# Patient Record
Sex: Female | Born: 1960
Health system: Southern US, Community
[De-identification: ages and names within clinical notes are randomized; demographics above are authoritative.]

## PROBLEM LIST (undated history)

## (undated) DIAGNOSIS — N951 Menopausal and female climacteric states: Secondary | ICD-10-CM

## (undated) DIAGNOSIS — IMO0002 Reserved for concepts with insufficient information to code with codable children: Secondary | ICD-10-CM

## (undated) DIAGNOSIS — K219 Gastro-esophageal reflux disease without esophagitis: Secondary | ICD-10-CM

## (undated) DIAGNOSIS — J302 Other seasonal allergic rhinitis: Secondary | ICD-10-CM

## (undated) DIAGNOSIS — I493 Ventricular premature depolarization: Secondary | ICD-10-CM

## (undated) DIAGNOSIS — F32A Depression, unspecified: Secondary | ICD-10-CM

## (undated) DIAGNOSIS — I491 Atrial premature depolarization: Secondary | ICD-10-CM

## (undated) DIAGNOSIS — B399 Histoplasmosis, unspecified: Secondary | ICD-10-CM

## (undated) DIAGNOSIS — G4700 Insomnia, unspecified: Secondary | ICD-10-CM

## (undated) DIAGNOSIS — R011 Cardiac murmur, unspecified: Secondary | ICD-10-CM

## (undated) DIAGNOSIS — F411 Generalized anxiety disorder: Secondary | ICD-10-CM

## (undated) DIAGNOSIS — J069 Acute upper respiratory infection, unspecified: Secondary | ICD-10-CM

## (undated) DIAGNOSIS — K5909 Other constipation: Secondary | ICD-10-CM

## (undated) HISTORY — DX: Insomnia, unspecified: G47.00

## (undated) HISTORY — DX: Gastro-esophageal reflux disease without esophagitis: K21.9

## (undated) HISTORY — PX: TUBAL LIGATION: SHX77

## (undated) HISTORY — PX: SINOSCOPY: SHX187

## (undated) HISTORY — DX: Cardiac murmur, unspecified: R01.1

## (undated) HISTORY — DX: Acute upper respiratory infection, unspecified: J06.9

## (undated) HISTORY — PX: NOSE SURGERY: SHX723

## (undated) HISTORY — DX: Depression, unspecified: F32.A

## (undated) HISTORY — DX: Atrial premature depolarization: I49.1

## (undated) HISTORY — DX: Other constipation: K59.09

## (undated) HISTORY — DX: Ventricular premature depolarization: I49.3

## (undated) HISTORY — DX: Histoplasmosis, unspecified: B39.9

## (undated) HISTORY — PX: DILATION AND CURETTAGE OF UTERUS: SHX78

## (undated) HISTORY — DX: Reserved for concepts with insufficient information to code with codable children: IMO0002

## (undated) HISTORY — DX: Other seasonal allergic rhinitis: J30.2

## (undated) HISTORY — DX: Generalized anxiety disorder: F41.1

## (undated) HISTORY — DX: Menopausal and female climacteric states: N95.1

---

## 1993-04-28 DIAGNOSIS — K5909 Other constipation: Secondary | ICD-10-CM | POA: Insufficient documentation

## 2000-12-16 DIAGNOSIS — R55 Syncope and collapse: Secondary | ICD-10-CM | POA: Insufficient documentation

## 2002-09-10 DIAGNOSIS — Z8709 Personal history of other diseases of the respiratory system: Secondary | ICD-10-CM | POA: Insufficient documentation

## 2002-09-10 DIAGNOSIS — Z87898 Personal history of other specified conditions: Secondary | ICD-10-CM | POA: Insufficient documentation

## 2004-07-25 DIAGNOSIS — B399 Histoplasmosis, unspecified: Secondary | ICD-10-CM

## 2004-07-25 HISTORY — DX: Histoplasmosis, unspecified: B39.9

## 2005-12-09 DIAGNOSIS — R918 Other nonspecific abnormal finding of lung field: Secondary | ICD-10-CM | POA: Insufficient documentation

## 2008-07-25 HISTORY — PX: COLPOSCOPY: SHX161

## 2013-02-20 ENCOUNTER — Ambulatory Visit (INDEPENDENT_AMBULATORY_CARE_PROVIDER_SITE_OTHER): Payer: BC Managed Care – PPO | Admitting: Family Medicine

## 2013-02-20 ENCOUNTER — Encounter: Payer: Self-pay | Admitting: Family Medicine

## 2013-02-20 VITALS — BP 122/84 | HR 85 | Temp 98.1°F | Ht 64.0 in | Wt 145.2 lb

## 2013-02-20 DIAGNOSIS — B399 Histoplasmosis, unspecified: Secondary | ICD-10-CM

## 2013-02-20 DIAGNOSIS — J302 Other seasonal allergic rhinitis: Secondary | ICD-10-CM

## 2013-02-20 DIAGNOSIS — M6790 Unspecified disorder of synovium and tendon, unspecified site: Secondary | ICD-10-CM

## 2013-02-20 DIAGNOSIS — F411 Generalized anxiety disorder: Secondary | ICD-10-CM

## 2013-02-20 DIAGNOSIS — K219 Gastro-esophageal reflux disease without esophagitis: Secondary | ICD-10-CM

## 2013-02-20 DIAGNOSIS — M6789 Other specified disorders of synovium and tendon, multiple sites: Secondary | ICD-10-CM

## 2013-02-20 DIAGNOSIS — J309 Allergic rhinitis, unspecified: Secondary | ICD-10-CM

## 2013-02-20 NOTE — Progress Notes (Signed)
Nature conservation officer at [facility] [address] [place] [address] Phone: [phone] Fax: [phone]  Date:  02/20/2013   Name:  Katie Todd   DOB:  26-May-1961   MRN:  [id number] Gender: female Age: 52 y.o.  Primary Physician:  Hannah Beat, MD  Evaluating MD: Hannah Beat, MD   Chief Complaint: Establish Care   History of Present Illness:  Katie Todd is a 52 y.o. pleasant patient who presents with the following:  New patient evaluation: Found lung nodules - coughed a tone up. Had a large work-up in [place], including the [facility] and found to have Histoplasmosis.  Has had some pulmonary nodules in the past, then did a CT scan. ? Histoplasmosis. Never had a bronch.   R 2nd digit nodule, appears to be contiguous with space between PIP and MCP on the R.  She also has intermittent allergies  Some anxiety and uses prn ativan  GERD also with zantac use  There are no active problems to display for this patient.   No past medical history on file.  No past surgical history on file.  History   Social History  . Marital Status: Married    Spouse Name: N/A    Number of Children: N/A  . Years of Education: N/A   Occupational History  . Not on file.   Social History Main Topics  . Smoking status: Former Smoker    Types: Cigarettes    Quit date: 02/20/1990  . Smokeless tobacco: Not on file  . Alcohol Use: Not on file  . Drug Use: Not on file  . Sexually Active: Not on file   Other Topics Concern  . Not on file   Social History Narrative  . No narrative on file    No family history on file.  Allergies  Allergen Reactions  . Penicillins Anaphylaxis    Medication list has been reviewed and updated.  No outpatient prescriptions prior to visit.   No facility-administered medications prior to visit.    Review of Systems:   GEN: No acute illnesses, no fevers, chills. GI: No n/v/d, eating normally Pulm: No SOB Interactive and  getting along well at home.  Otherwise, ROS is as per the HPI.   Physical Examination: BP 122/84  Pulse 85  Temp(Src) 98.1 F (36.7 C) (Oral)  Ht 5\' 4"  (1.626 m)  Wt 145 lb 4 oz (65.885 kg)  BMI 24.92 kg/m2  SpO2 98%  LMP 07/25/2012  Ideal Body Weight: Weight in (lb) to have BMI = 25: 145.3   GEN: WDWN, NAD, Non-toxic, A & O x 3 HEENT: Atraumatic, Normocephalic. Neck supple. No masses, No LAD. Ears and Nose: No external deformity. CV: RRR, No M/G/R. No JVD. No thrill. No extra heart sounds. PULM: CTA B, no wheezes, crackles, rhonchi. No retractions. No resp. distress. No accessory muscle use. EXTR: No c/c/e NEURO Normal gait.  PSYCH: Normally interactive. Conversant. Not depressed or anxious appearing.  Calm demeanor.    R hand Ecchymosis or edema: neg ROM wrist/hand/digits: full  Carpals, MCP's, digits: palpable nodule on the flexor aspect of the 2nd digit between the mcp and pip joints Distal Ulna and Radius: NT Ecchymosis or edema: neg No instability Digit triggering: neg Finkelstein's test: neg Snuffbox tenderness: neg Scaphoid tubercle: NT Resisted supination: NT Full composite fist, no malrotation Grip, all digits: 5/5 str DIPJT: NT PIP JT: NT MCP JT: NT No tenosynovitis Axial load test: neg Phalen's: neg Tinel's: neg Atrophy: neg  Hand sensation: intact   Assessment and Plan:  Histoplasmosis  Seasonal allergies  GERD (gastroesophageal reflux disease)  Anxiety state, unspecified  Nodule of flexor tendon sheath   Reviewed PMH We will obtain records from the patient's prior physicians and review.  All stable except nodule, which I think is contiguous with sheath. No triggering, but I think injecting the sheath with steroids could decrease its size, like with a trigger finger injection.  Flexor Tendon sheath Injection, R 2nd Verbal consent was obtained. Risks (including rare risk of infection, potential risk for skin lightening and potential  atrophy), benefits and alternatives were discussed. Prepped with Chloraprep and Ethyl Chloride used for anesthesia. Under sterile conditions, patient injected at palmar crease aiming distally with 45 degree angle towards nodule; injected directly into tendon sheath. Medication flowed freely without resistance.  Needle size: 22 gauge 1 1/2 inch Injection: 1/2 cc of Lidocaine 1% and 1/2 cc of Depo-Medrol 40 mg   Orders Today:  No orders of the defined types were placed in this encounter.    Updated Medication List: (Includes new medications, updates to list, dose adjustments) Meds ordered this encounter  Medications  . LORazepam (ATIVAN) 1 MG tablet    Sig: Take 1 mg by mouth at bedtime as needed for anxiety.  . Nutritional Supplements (ESTROVEN PO)    Sig: Take 1 tablet by mouth every other day.  . ranitidine (ZANTAC) 150 MG capsule    Sig: Take 150 mg by mouth 2 (two) times daily.  Marland Kitchen loratadine (CLARITIN) 10 MG tablet    Sig: Take 10 mg by mouth daily.    Medications Discontinued: There are no discontinued medications.    Signed, Elpidio Galea. Elisheva Fallas, MD 02/20/2013 10:48 AM

## 2013-02-23 ENCOUNTER — Encounter: Payer: Self-pay | Admitting: Family Medicine

## 2013-02-23 DIAGNOSIS — J302 Other seasonal allergic rhinitis: Secondary | ICD-10-CM | POA: Insufficient documentation

## 2013-02-23 DIAGNOSIS — B399 Histoplasmosis, unspecified: Secondary | ICD-10-CM | POA: Insufficient documentation

## 2013-02-23 DIAGNOSIS — F411 Generalized anxiety disorder: Secondary | ICD-10-CM | POA: Insufficient documentation

## 2013-02-23 DIAGNOSIS — K219 Gastro-esophageal reflux disease without esophagitis: Secondary | ICD-10-CM | POA: Insufficient documentation

## 2013-02-23 HISTORY — DX: Other seasonal allergic rhinitis: J30.2

## 2013-02-23 HISTORY — DX: Generalized anxiety disorder: F41.1

## 2013-02-23 HISTORY — DX: Gastro-esophageal reflux disease without esophagitis: K21.9

## 2013-02-26 DIAGNOSIS — M6790 Unspecified disorder of synovium and tendon, unspecified site: Secondary | ICD-10-CM | POA: Insufficient documentation

## 2014-02-10 ENCOUNTER — Encounter: Payer: Self-pay | Admitting: Obstetrics & Gynecology

## 2014-02-10 ENCOUNTER — Ambulatory Visit (INDEPENDENT_AMBULATORY_CARE_PROVIDER_SITE_OTHER): Payer: BC Managed Care – PPO | Admitting: Obstetrics & Gynecology

## 2014-02-10 VITALS — BP 131/83 | HR 80 | Ht 64.5 in | Wt 153.6 lb

## 2014-02-10 DIAGNOSIS — Z124 Encounter for screening for malignant neoplasm of cervix: Secondary | ICD-10-CM

## 2014-02-10 DIAGNOSIS — Z1151 Encounter for screening for human papillomavirus (HPV): Secondary | ICD-10-CM

## 2014-02-10 DIAGNOSIS — Z0000 Encounter for general adult medical examination without abnormal findings: Secondary | ICD-10-CM

## 2014-02-10 DIAGNOSIS — Z113 Encounter for screening for infections with a predominantly sexual mode of transmission: Secondary | ICD-10-CM

## 2014-02-10 DIAGNOSIS — Z01419 Encounter for gynecological examination (general) (routine) without abnormal findings: Secondary | ICD-10-CM

## 2014-02-10 MED ORDER — ESTRADIOL-NORETHINDRONE ACET 1-0.5 MG PO TABS: 1.0000 | ORAL_TABLET | Freq: Every day | ORAL | Status: DC

## 2014-02-12 LAB — CYTOLOGY - PAP

## 2014-02-13 NOTE — Progress Notes (Signed)
  Subjective:     Katie Todd is a 53 y.o. female here for a routine exam.  Current complaints: sever hot flashes and night sweats which do not allow her to sleep through the night.  She is lethargic at work and is interested in starting HRT.  She has her uterus and will need prog as well.  Discussed risks of HRT.  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  Personal health questionnaire reviewed: yes.   Gynecologic History Patient's last menstrual period was 01/24/2014. Contraception: sterilization Last Pap: nml per patient (non in our system) Last mammogram: nml per patient.  None in our system  Obstetric History OB History  Gravida Para Term Preterm AB SAB TAB Ectopic Multiple Living  5 1 1  0 4     1    # Outcome Date GA Lbr Len/2nd Weight Sex Delivery Anes PTL Lv  5 ABT           4 ABT           3 ABT           2 ABT           1 TRM                The following portions of the patient's history were reviewed and updated as appropriate: allergies, current medications, past family history, past medical history, past social history, past surgical history and problem list.  Review of Systems Pertinent items are noted in HPI.    Objective:      Filed Vitals:   02/10/14 1437  BP: 131/83  Pulse: 80  Height: 5' 4.5" (1.638 m)  Weight: 153 lb 9.6 oz (69.673 kg)   Vitals:  WNL General appearance: alert, cooperative and no distress Head: Normocephalic, without obvious abnormality, atraumatic Eyes: negative Throat: lips, mucosa, and tongue normal; teeth and gums normal Lungs: clear to auscultation bilaterally Breasts: normal appearance, no masses or tenderness, No nipple retraction or dimpling, No nipple discharge or bleeding Heart: regular rate and rhythm Abdomen: soft, non-tender; bowel sounds normal; no masses,  no organomegaly  Pelvic:  External Genitalia:  Tanner V, no lesion Urethra:  No prolapse Vagina:  Pink, normal rugae, no blood or  discharge Cervix:  No CMT, no lesion Uterus:  Normal size and contour, non tender Adnexa:  Normal, no masses, non tender Rectovaginal Septum:  Non tender, no masses  Extremities: no edema, redness or tenderness in the calves or thighs Skin: no lesions or rash Lymph nodes: Axillary adenopathy: none         Assessment:    Healthy female exam.    Plan:    Mammogram ordered. Follow up in: 3 months. Pap with co testing Activella ordered.  Reviewed side effects.

## 2014-03-06 ENCOUNTER — Telehealth: Payer: Self-pay | Admitting: *Deleted

## 2014-03-06 MED ORDER — ESTRADIOL-NORETHINDRONE ACET 1-0.5 MG PO TABS: 1.0000 | ORAL_TABLET | Freq: Every day | ORAL | Status: DC

## 2014-03-06 NOTE — Telephone Encounter (Signed)
Patient needs rx sent to her mail order.

## 2014-03-17 ENCOUNTER — Ambulatory Visit (HOSPITAL_COMMUNITY)
Admission: RE | Admit: 2014-03-17 | Discharge: 2014-03-17 | Disposition: A | Discharge status: Home / Self Care | Payer: BC Managed Care – PPO | Source: Ambulatory Visit | Attending: Obstetrics & Gynecology | Admitting: Obstetrics & Gynecology

## 2014-03-17 ENCOUNTER — Other Ambulatory Visit: Payer: Self-pay | Admitting: Obstetrics & Gynecology

## 2014-03-17 DIAGNOSIS — Z1231 Encounter for screening mammogram for malignant neoplasm of breast: Secondary | ICD-10-CM | POA: Insufficient documentation

## 2014-03-17 DIAGNOSIS — Z0000 Encounter for general adult medical examination without abnormal findings: Secondary | ICD-10-CM

## 2014-03-21 ENCOUNTER — Encounter: Payer: Self-pay | Admitting: Family Medicine

## 2014-03-21 ENCOUNTER — Ambulatory Visit (INDEPENDENT_AMBULATORY_CARE_PROVIDER_SITE_OTHER): Payer: BC Managed Care – PPO | Admitting: Family Medicine

## 2014-03-21 VITALS — BP 100/72 | HR 78 | Temp 98.3°F | Ht 64.5 in | Wt 156.5 lb

## 2014-03-21 DIAGNOSIS — R05 Cough: Secondary | ICD-10-CM

## 2014-03-21 DIAGNOSIS — R053 Chronic cough: Secondary | ICD-10-CM | POA: Insufficient documentation

## 2014-03-21 DIAGNOSIS — R059 Cough, unspecified: Secondary | ICD-10-CM

## 2014-03-21 MED ORDER — PREDNISONE 20 MG PO TABS: ORAL_TABLET | ORAL | Status: DC

## 2014-03-21 NOTE — Patient Instructions (Signed)
Will try steroid taper as this has been helpful in past. Call when interested in pulmonology referral.  Go to ER if severe shortness of breath.

## 2014-03-21 NOTE — Progress Notes (Signed)
Pre visit review using our clinic review tool, if applicable. No additional management support is needed unless otherwise documented below in the visit note. 

## 2014-03-21 NOTE — Progress Notes (Signed)
   Subjective:    Patient ID: Katie Todd, female    DOB: September 06, 1960, 53 y.o.   MRN: [id number]  HPI 53 year old female pt of  Dr. Lillie Fragmin with history of histoplasmosis nodules in both lungs, GERD and allergies presents with new onset shortness of breath and recurrent cough in last 3 weeks. Hoarse voice, frequent coughing spells. Worse in AM and at night. Dry cough. Worse with exercise. Some chronic nasal congestion from sinus issues. She has had chronic cough x 9 years intermittant spells   Has CT  Chest scans every few years, last 2014 has second histoplasmosis infectionwith new nodules.[facility] has told her chronic cough is not from histoplasmosis. No specific treatments.   She has had asthma challenge eval.. Neg for asthma.  She takes zantac for reflux , well controlled. She has had laryngoscopy with ENT.Marland Kitchen nml vocal cord.  Benzonanate and codeine has not helped in past. Uses benadryl and zyrtec for allergies. She has not had any improvement with singulair. predneionse has helped in past.      Review of Systems  Constitutional: Negative for fever and fatigue.  HENT: Negative for ear pain.   Eyes: Negative for pain.  Respiratory: Positive for cough and shortness of breath. Negative for chest tightness.   Cardiovascular: Negative for chest pain, palpitations and leg swelling.  Gastrointestinal: Negative for abdominal pain.  Genitourinary: Negative for dysuria.       Objective:   Physical Exam  Constitutional: Vital signs are normal. She appears well-developed and well-nourished. She is cooperative.  Non-toxic appearance. She does not appear ill. No distress.  HENT:  Head: Normocephalic.  Right Ear: Hearing, tympanic membrane, external ear and ear canal normal. Tympanic membrane is not erythematous, not retracted and not bulging.  Left Ear: Hearing, tympanic membrane, external ear and ear canal normal. Tympanic membrane is not erythematous, not  retracted and not bulging.  Nose: Mucosal edema present. No rhinorrhea. Right sinus exhibits no maxillary sinus tenderness and no frontal sinus tenderness. Left sinus exhibits no maxillary sinus tenderness and no frontal sinus tenderness.  Mouth/Throat: Uvula is midline, oropharynx is clear and moist and mucous membranes are normal.  Eyes: Conjunctivae, EOM and lids are normal. Pupils are equal, round, and reactive to light. Lids are everted and swept, no foreign bodies found.  Neck: Trachea normal and normal range of motion. Neck supple. Carotid bruit is not present. No mass and no thyromegaly present.  Cardiovascular: Normal rate, regular rhythm, S1 normal, S2 normal, normal heart sounds, intact distal pulses and normal pulses.  Exam reveals no gallop and no friction rub.   No murmur heard. Pulmonary/Chest: Effort normal and breath sounds normal. Not tachypneic. No respiratory distress. She has no decreased breath sounds. She has no wheezes. She has no rhonchi. She has no rales.  Neurological: She is alert.  Skin: Skin is warm, dry and intact. No rash noted.  Psychiatric: Her speech is normal and behavior is normal. Judgment normal. Her mood appears not anxious. Cognition and memory are normal. She does not exhibit a depressed mood.          Assessment & Plan:

## 2014-04-13 NOTE — Assessment & Plan Note (Signed)
Complicated history with histoplasmosis. Will try steroid taper as this has been helpful in past. Call when interested in pulmonology referral.

## 2014-05-26 ENCOUNTER — Encounter: Payer: Self-pay | Admitting: Family Medicine

## 2014-08-14 ENCOUNTER — Telehealth: Payer: Self-pay | Admitting: *Deleted

## 2014-08-14 ENCOUNTER — Encounter: Payer: Self-pay | Admitting: *Deleted

## 2014-08-14 NOTE — Telephone Encounter (Signed)
Patients insurance is not covering her Activella it is over 100.00 a month out of pocket.  I have contacted [facility] to see if this is something that could be compounded for her at a savings.  She states that the medication is working very well for her and she could tell within a week that she was feeling much better and sleeping better.  This can not be compounded.  She is wanting to know if you have any other suggestions that might work as well but not cost so much.

## 2014-08-25 ENCOUNTER — Telehealth: Payer: Self-pay | Admitting: *Deleted

## 2014-08-25 MED ORDER — NORETHINDRONE-ETH ESTRADIOL 1-5 MG-MCG PO TABS: 1.0000 | ORAL_TABLET | Freq: Every day | ORAL | Status: DC

## 2014-08-25 NOTE — Telephone Encounter (Signed)
Called in change of medication to [facility] which is generic for femhrt.    This will be half the cost for the patient.

## 2014-09-10 ENCOUNTER — Encounter: Payer: Self-pay | Admitting: Family Medicine

## 2014-09-10 ENCOUNTER — Ambulatory Visit (INDEPENDENT_AMBULATORY_CARE_PROVIDER_SITE_OTHER): Payer: BLUE CROSS/BLUE SHIELD | Admitting: Family Medicine

## 2014-09-10 VITALS — BP 108/72 | HR 82 | Temp 98.5°F | Wt 153.5 lb

## 2014-09-10 DIAGNOSIS — J0100 Acute maxillary sinusitis, unspecified: Secondary | ICD-10-CM

## 2014-09-10 MED ORDER — ESTRADIOL-NORETHINDRONE ACET 1-0.5 MG PO TABS: ORAL_TABLET | ORAL | Status: DC

## 2014-09-10 MED ORDER — DOXYCYCLINE HYCLATE 100 MG PO TABS: 100.0000 mg | ORAL_TABLET | Freq: Two times a day (BID) | ORAL | Status: DC

## 2014-09-10 NOTE — Progress Notes (Signed)
Pre visit review using our clinic review tool, if applicable. No additional management support is needed unless otherwise documented below in the visit note.  H/o L sided sinus surgery.  Sick for the last month.  Had a cold initially.  Multiple sick contacts.   Voice is altered.  Using nasal saline, taking pseudophed.   Was getting better, then worse again recently.   No FCNAVD.  Had chills about 1 week ago.  L maxillary pain.    Meds, vitals, and allergies reviewed.   ROS: See HPI.  Otherwise, noncontributory.  GEN: nad, alert and oriented HEENT: mucous membranes moist, tm w/o erythema, nasal exam w/o erythema, clear discharge noted,  OP with cobblestoning, L max sinus ttp NECK: supple w/o LA CV: rrr.   PULM: ctab, no inc wob EXT: no edema

## 2014-09-10 NOTE — Assessment & Plan Note (Signed)
Nontoxic, reasonable to treat.  Start doxy.  She has some left over tessalon to try for the cough.  Supportive care.  F/u prn.  She agrees.

## 2014-09-10 NOTE — Patient Instructions (Signed)
Take care.  Try to get some rest.  Drink plenty of fluids and use nasal saline.  Start doxycycline today.

## 2014-09-30 ENCOUNTER — Encounter: Payer: Self-pay | Admitting: Internal Medicine

## 2014-09-30 ENCOUNTER — Ambulatory Visit (INDEPENDENT_AMBULATORY_CARE_PROVIDER_SITE_OTHER): Payer: BLUE CROSS/BLUE SHIELD | Admitting: Internal Medicine

## 2014-09-30 VITALS — BP 106/78 | HR 84 | Temp 98.2°F | Wt 150.0 lb

## 2014-09-30 DIAGNOSIS — J0101 Acute recurrent maxillary sinusitis: Secondary | ICD-10-CM

## 2014-09-30 MED ORDER — CLARITHROMYCIN 500 MG PO TABS: 500.0000 mg | ORAL_TABLET | Freq: Two times a day (BID) | ORAL | Status: DC

## 2014-09-30 NOTE — Progress Notes (Signed)
HPI  Pt presents to the clinic today with c/o headache, cough and sore throat. His symptoms started 1.5 months ago. He was seen for the same by 09/10/14, diagnosed with maxillary sinusitis and given Doxycycline at that time. She also took a prednisone taper she had left over. She reports her symptoms did not improve. Her cough is productive of thick yellow mucous- mostly in the mornings. She is unable to blow any mucous out of her nose. She denies fever, chills or body aches. She has tried Benadryl, Sudafed and a saline nasal rinse without much relief. She does not smoke. She does have a history of allergies. She has had sinus surgery in the past. She has seen ENT but not recently.  Review of Systems    Past Medical History  Diagnosis Date  . Histoplasmosis 02/23/2013  . Seasonal allergies 02/23/2013  . GERD (gastroesophageal reflux disease) 02/23/2013  . Anxiety state, unspecified 02/23/2013  . PAC (premature atrial contraction)   . PVC (premature ventricular contraction)   . HPV test positive   . Insomnia   . Hot flashes, menopausal     No family history on file.  History   Social History  . Marital Status: Married    Spouse Name: N/A  . Number of Children: N/A  . Years of Education: N/A   Occupational History  . Not on file.   Social History Main Topics  . Smoking status: Former Smoker    Types: Cigarettes    Quit date: 02/20/1990  . Smokeless tobacco: Never Used  . Alcohol Use: 0.0 oz/week    0 Standard drinks or equivalent per week     Comment: rarely  . Drug Use: No  . Sexual Activity:    Partners: Male    Birth Control/ Protection: Surgical     Comment: tubal ligation   Other Topics Concern  . Not on file   Social History Narrative    Allergies  Allergen Reactions  . Penicillins Anaphylaxis     Constitutional: Positive headache. Denies fatigue, fever or abrupt weight changes.  HEENT:  Positive  facial pain, nasal congestion and sore throat. Denies eye  redness, ear pain, ringing in the ears, wax buildup, runny nose or bloody nose. Respiratory: Positive cough. Denies difficulty breathing or shortness of breath.  Cardiovascular: Denies chest pain, chest tightness, palpitations or swelling in the hands or feet.   No other specific complaints in a complete review of systems (except as listed in HPI above).  Objective:  BP 106/78 mmHg  Pulse 84  Temp(Src) 98.2 F (36.8 C) (Oral)  Wt 150 lb (68.04 kg)  SpO2 98%  LMP 01/24/2014   General: Appears her stated age, well developed, well nourished in NAD. HEENT: Head: normal shape and size, maxillary sinus tenderness noted; Eyes: sclera white, no icterus, conjunctiva pink; Ears: Tm's gray and intact, normal light reflex; Nose: boggy and moist, septum midline; Throat/Mouth: + PND. Teeth present, mucosa pink and moist, no exudate noted, no lesions or ulcerations noted.  Neck: No adenopathy noted. Cardiovascular: Normal rate and rhythm. S1,S2 noted.  No murmur, rubs or gallops noted.  Pulmonary/Chest: Normal effort and positive vesicular breath sounds. No respiratory distress. No wheezes, rales or ronchi noted.      Assessment & Plan:   Recurrent maxillary sinusitis  Can use a Neti Pot which can be purchased from your local drug store. Flonase 2 sprays each nostril for 3 days and then as needed. Biaxin BID for 14 days If no  improvement, she should follow up with ENT  RTC as needed or if symptoms persist.

## 2014-09-30 NOTE — Patient Instructions (Signed)

## 2014-09-30 NOTE — Progress Notes (Signed)
Pre visit review using our clinic review tool, if applicable. No additional management support is needed unless otherwise documented below in the visit note. 

## 2014-11-13 ENCOUNTER — Ambulatory Visit (INDEPENDENT_AMBULATORY_CARE_PROVIDER_SITE_OTHER): Payer: BLUE CROSS/BLUE SHIELD | Admitting: Internal Medicine

## 2014-11-13 ENCOUNTER — Encounter: Payer: Self-pay | Admitting: Internal Medicine

## 2014-11-13 VITALS — BP 116/72 | HR 86 | Temp 98.8°F | Wt 142.0 lb

## 2014-11-13 DIAGNOSIS — J0100 Acute maxillary sinusitis, unspecified: Secondary | ICD-10-CM

## 2014-11-13 MED ORDER — LEVOFLOXACIN 500 MG PO TABS
500.0000 mg | ORAL_TABLET | Freq: Every day | ORAL | Status: DC
Start: 2014-11-13 — End: 2015-05-11

## 2014-11-13 NOTE — Progress Notes (Signed)
Pre visit review using our clinic review tool, if applicable. No additional management support is needed unless otherwise documented below in the visit note. 

## 2014-11-13 NOTE — Progress Notes (Signed)
HPI  Pt presents to the clinic today with c/o headache, facial pressure, nasal drainage, ear pain and sore throat. This started 2 months ago. The left ear hurts more than the right. She is blowing green mucous out of her nose. She denies fever, chills or body aches. She had tried Zyrtec, Benadryl, Sudafed and a saline nasal rinse without much relief. She has been through 2 rounds of antibiotics, doxycycline and biaxin. She did get some relief with the biaxin but symptoms returned shortly after stopping the antibiotics. She reports she has been evaluated by ENT in the past. She has had sinus surgery in the past.  Review of Systems    Past Medical History  Diagnosis Date  . Histoplasmosis 02/23/2013  . Seasonal allergies 02/23/2013  . GERD (gastroesophageal reflux disease) 02/23/2013  . Anxiety state, unspecified 02/23/2013  . PAC (premature atrial contraction)   . PVC (premature ventricular contraction)   . HPV test positive   . Insomnia   . Hot flashes, menopausal     No family history on file.  History   Social History  . Marital Status: Married    Spouse Name: N/A  . Number of Children: N/A  . Years of Education: N/A   Occupational History  . Not on file.   Social History Main Topics  . Smoking status: Former Smoker    Types: Cigarettes    Quit date: 02/20/1990  . Smokeless tobacco: Never Used  . Alcohol Use: 0.0 oz/week    0 Standard drinks or equivalent per week     Comment: rarely  . Drug Use: No  . Sexual Activity:    Partners: Male    Birth Control/ Protection: Surgical     Comment: tubal ligation   Other Topics Concern  . Not on file   Social History Narrative    Allergies  Allergen Reactions  . Penicillins Anaphylaxis     Constitutional: Positive headache. Denies fatigue, fever or abrupt weight changes.  HEENT:  Positive facial pain, nasal congestion and sore throat. Denies eye redness, ear pain, ringing in the ears, wax buildup, runny nose or bloody  nose. Respiratory: Positive cough. Denies difficulty breathing or shortness of breath.  Cardiovascular: Denies chest pain, chest tightness, palpitations or swelling in the hands or feet.   No other specific complaints in a complete review of systems (except as listed in HPI above).  Objective:  BP 116/72 mmHg  Pulse 86  Temp(Src) 98.8 F (37.1 C) (Oral)  Wt 142 lb (64.411 kg)  SpO2 99%  LMP 01/24/2014  General: Appears her stated age, well developed, well nourished in NAD. HEENT: Head: normal shape and size, left maxillary sinus tenderness noted; Eyes: sclera white, no icterus, conjunctiva pink; Ears: Tm's gray and intact, normal light reflex, + effusion on the left; Nose: mucosa pink and moist, septum midline; Throat/Mouth: + PND. Teeth present, mucosa pink and moist, no exudate noted, no lesions or ulcerations noted.  Neck: No adenopathy noted.  Cardiovascular: Normal rate and rhythm. S1,S2 noted.  No murmur, rubs or gallops noted. Pulmonary/Chest: Normal effort and positive vesicular breath sounds. No respiratory distress. No wheezes, rales or ronchi noted.      Assessment & Plan:   Acute bacterial sinusitis  Can use a Neti Pot which can be purchased from your local drug store. Continue Zyrtec, stop benadryl and sudafed Levaquin daily for 14 days Referral placed to ENT  RTC as needed or if symptoms persist.

## 2014-11-13 NOTE — Patient Instructions (Signed)

## 2015-01-21 ENCOUNTER — Encounter: Payer: Self-pay | Admitting: Primary Care

## 2015-01-21 ENCOUNTER — Ambulatory Visit (INDEPENDENT_AMBULATORY_CARE_PROVIDER_SITE_OTHER): Payer: BLUE CROSS/BLUE SHIELD | Admitting: Primary Care

## 2015-01-21 VITALS — BP 102/64 | HR 98 | Temp 98.1°F | Wt 134.5 lb

## 2015-01-21 DIAGNOSIS — L03116 Cellulitis of left lower limb: Secondary | ICD-10-CM

## 2015-01-21 MED ORDER — DOXYCYCLINE HYCLATE 100 MG PO TABS: 100.0000 mg | ORAL_TABLET | Freq: Two times a day (BID) | ORAL | Status: DC

## 2015-01-21 NOTE — Progress Notes (Signed)
Pre visit review using our clinic review tool, if applicable. No additional management support is needed unless otherwise documented below in the visit note. 

## 2015-01-21 NOTE — Patient Instructions (Signed)
Start Doxycycline antibiotics. Take 1 tablet by mouth twice daily for 10 days.  Call me if no improvement or if wound returns. It was a pleasure to meet you today!

## 2015-01-21 NOTE — Progress Notes (Signed)
Subjective:    Patient ID: Katie Todd, female    DOB: December 29, 1960, 54 y.o.   MRN: [id number]  HPI  Ms. Rings is a 54 year old female who presents today with a chief complaint of skin infection. 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 She never felt or saw an insect bite her, but did notice a small mark to her skin that resembled an insect bite. Her infection is present to the left upper leg lateral to groin.  She has erythema, tenderness, and firmness. Over the past several days she's felt tired. Denies growth of wound, itching, fevers.  Review of Systems  Constitutional: Positive for chills. Negative for fever.  Respiratory: Negative for shortness of breath.   Cardiovascular: Negative for chest pain.  Musculoskeletal: Negative for myalgias.  Skin: Positive for color change and wound.       Past Medical History  Diagnosis Date  . Histoplasmosis 02/23/2013  . Seasonal allergies 02/23/2013  . GERD (gastroesophageal reflux disease) 02/23/2013  . Anxiety state, unspecified 02/23/2013  . PAC (premature atrial contraction)   . PVC (premature ventricular contraction)   . HPV test positive   . Insomnia   . Hot flashes, menopausal     History   Social History  . Marital Status: Married    Spouse Name: N/A  . Number of Children: N/A  . Years of Education: N/A   Occupational History  . Not on file.   Social History Main Topics  . Smoking status: Former Smoker    Types: Cigarettes    Quit date: 02/20/1990  . Smokeless tobacco: Never Used  . Alcohol Use: 0.0 oz/week    0 Standard drinks or equivalent per week     Comment: rarely  . Drug Use: No  . Sexual Activity:    Partners: Male    Birth Control/ Protection: Surgical     Comment: tubal ligation   Other Topics Concern  . Not on file   Social History Narrative    Past Surgical History  Procedure Laterality Date  . Colposcopy  2010    Negative  . Dilation and curettage of uterus     missed ab  . Nose surgery      No family history on file.  Allergies  Allergen Reactions  . Penicillins Anaphylaxis    Current Outpatient Prescriptions on File Prior to Visit  Medication Sig Dispense Refill  . aspirin 81 MG chewable tablet Chew 81 mg by mouth daily.    . calcium carbonate 1250 MG capsule Take 1,250 mg by mouth 2 (two) times daily with a meal.    . cetirizine (ZYRTEC) 10 MG tablet Take 10 mg by mouth daily.    . diphenhydrAMINE (BENADRYL ALLERGY) 25 mg capsule Take 50 mg by mouth every 6 (six) hours as needed.     . pseudoephedrine (SUDAFED) 30 MG tablet Take 30 mg by mouth as needed for congestion.     No current facility-administered medications on file prior to visit.    BP 102/64 mmHg  Pulse 98  Temp(Src) 98.1 F (36.7 C) (Oral)  Wt 134 lb 8 oz (61.009 kg)  SpO2 98%  LMP 01/24/2014    Objective:   Physical Exam  Constitutional: She appears well-nourished.  Cardiovascular: Normal rate and regular rhythm.   Pulmonary/Chest: Effort normal and breath sounds normal.  Psychiatric:  4.5 cm raised wound (closed) present to left (upper) lower extremity lateral to groin. Non fluctuant.  Assessment & Plan:  Cellulitis:  Present to the upper part of left lower extremity, lateral to groin. Present since being in a home with insects. Does not appear to be an abscess. Non fluctuant. Moderate erythema with 4.5 cm diameter. Firm. Tender. RX for Doxycycline BID x 10 days. Follow up if no improvement or if symptoms return.

## 2015-04-30 ENCOUNTER — Ambulatory Visit (INDEPENDENT_AMBULATORY_CARE_PROVIDER_SITE_OTHER): Payer: BLUE CROSS/BLUE SHIELD | Admitting: Obstetrics and Gynecology

## 2015-04-30 ENCOUNTER — Encounter: Payer: Self-pay | Admitting: Obstetrics and Gynecology

## 2015-04-30 VITALS — BP 131/84 | HR 77 | Resp 18 | Ht 64.0 in | Wt 125.0 lb

## 2015-04-30 DIAGNOSIS — Z1151 Encounter for screening for human papillomavirus (HPV): Secondary | ICD-10-CM | POA: Diagnosis not present

## 2015-04-30 DIAGNOSIS — Z01419 Encounter for gynecological examination (general) (routine) without abnormal findings: Secondary | ICD-10-CM

## 2015-04-30 DIAGNOSIS — Z124 Encounter for screening for malignant neoplasm of cervix: Secondary | ICD-10-CM | POA: Diagnosis not present

## 2015-04-30 NOTE — Progress Notes (Signed)
  Subjective:     Katie Todd is a 54 y.o. female [phone] postmenopausal who is here for a comprehensive physical exam. The patient reports no problems. She weaned herself off the HRT and states that she is doing well without it. She denies any urinary incontinence.  Social History   Social History  . Marital Status: Married    Spouse Name: N/A  . Number of Children: N/A  . Years of Education: N/A   Occupational History  . Not on file.   Social History Main Topics  . Smoking status: Former Smoker    Types: Cigarettes    Quit date: 02/20/1990  . Smokeless tobacco: Never Used  . Alcohol Use: 0.0 oz/week    0 Standard drinks or equivalent per week     Comment: rarely  . Drug Use: No  . Sexual Activity:    Partners: Male    Birth Control/ Protection: Surgical     Comment: tubal ligation   Other Topics Concern  . Not on file   Social History Narrative   Health Maintenance  Topic Date Due  . Hepatitis C Screening  [birth year]  . HIV Screening  09/06/1975  . TETANUS/TDAP  09/06/1979  . INFLUENZA VACCINE  02/23/2015  . MAMMOGRAM  03/17/2016  . PAP SMEAR  02/10/2017  . COLONOSCOPY  11/15/2020   Past Medical History  Diagnosis Date  . Histoplasmosis 02/23/2013  . Seasonal allergies 02/23/2013  . GERD (gastroesophageal reflux disease) 02/23/2013  . Anxiety state, unspecified 02/23/2013  . PAC (premature atrial contraction)   . PVC (premature ventricular contraction)   . HPV test positive   . Insomnia   . Hot flashes, menopausal    Past Surgical History  Procedure Laterality Date  . Colposcopy  2010    Negative  . Dilation and curettage of uterus      missed ab  . Nose surgery     History reviewed. No pertinent family history.     Review of Systems Pertinent items are noted in HPI.   Objective:      GENERAL: Well-developed, well-nourished female in no acute distress.  HEENT: Normocephalic, atraumatic. Sclerae anicteric.  NECK: Supple. Normal thyroid.  LUNGS:  Clear to auscultation bilaterally.  HEART: Regular rate and rhythm. BREASTS: Symmetric in size. No palpable masses or lymphadenopathy, skin changes, or nipple drainage. ABDOMEN: Soft, nontender, nondistended. No organomegaly. PELVIC: Normal external female genitalia. Vagina is pink and rugated.  Normal discharge. Normal appearing cervix. Uterus is normal in size. No adnexal mass or tenderness. EXTREMITIES: No cyanosis, clubbing, or edema, 2+ distal pulses.    Assessment:    Healthy female exam.      Plan:    pap smear collected Screening mammogram referral provided Patient advised to perform monthly self breast and vulva exam Patient will be contacted with any abnormal results RTC in 1 year or prn See After Visit Summary for Counseling Recommendations

## 2015-05-01 LAB — CYTOLOGY - PAP

## 2015-05-07 ENCOUNTER — Ambulatory Visit
Admission: RE | Admit: 2015-05-07 | Discharge: 2015-05-07 | Disposition: A | Discharge status: Home / Self Care | Payer: BLUE CROSS/BLUE SHIELD | Source: Ambulatory Visit | Attending: Obstetrics and Gynecology | Admitting: Obstetrics and Gynecology

## 2015-05-07 DIAGNOSIS — Z1231 Encounter for screening mammogram for malignant neoplasm of breast: Secondary | ICD-10-CM | POA: Diagnosis not present

## 2015-05-07 DIAGNOSIS — Z01419 Encounter for gynecological examination (general) (routine) without abnormal findings: Secondary | ICD-10-CM

## 2015-05-11 ENCOUNTER — Ambulatory Visit (INDEPENDENT_AMBULATORY_CARE_PROVIDER_SITE_OTHER): Payer: BLUE CROSS/BLUE SHIELD | Admitting: Family Medicine

## 2015-05-11 ENCOUNTER — Encounter: Payer: Self-pay | Admitting: Family Medicine

## 2015-05-11 VITALS — BP 112/74 | HR 74 | Temp 98.6°F | Ht 64.0 in | Wt 126.0 lb

## 2015-05-11 DIAGNOSIS — J0101 Acute recurrent maxillary sinusitis: Secondary | ICD-10-CM | POA: Diagnosis not present

## 2015-05-11 DIAGNOSIS — J0100 Acute maxillary sinusitis, unspecified: Secondary | ICD-10-CM

## 2015-05-11 DIAGNOSIS — J019 Acute sinusitis, unspecified: Secondary | ICD-10-CM | POA: Insufficient documentation

## 2015-05-11 MED ORDER — LEVOFLOXACIN 500 MG PO TABS: 500.0000 mg | ORAL_TABLET | Freq: Every day | ORAL | Status: DC

## 2015-05-11 NOTE — Progress Notes (Signed)
Pre visit review using our clinic review tool, if applicable. No additional management support is needed unless otherwise documented below in the visit note. 

## 2015-05-11 NOTE — Patient Instructions (Signed)
Drink lots of fluids Continue saline irrigation Take levaquin as directed for sinus infection  Cough med as needed- expectorant with DM if needed    Update if not starting to improve in a week or if worsening  =especially if worse pain or fever

## 2015-05-11 NOTE — Assessment & Plan Note (Signed)
Cover with augmentin In setting of uri and also chronic resp problems  Disc symptomatic care - see instructions on AVS  Update if not starting to improve in a week or if worsening

## 2015-05-11 NOTE — Progress Notes (Signed)
Subjective:    Patient ID: Katie Todd, female    DOB: 1960/11/09, 54 y.o.   MRN: [id number]  HPI Here for uri symptoms -- for over a week   Has hx of lung infection and pneumonia in the past - this worries about this   Started with chills and fatigue  Now has facial pain on L side with upper jaw/tooth pain  R ear hurts - really hurt when she sneezed yesterday      (has had nasal surg on that side in the past)  Blowing out green mucous   Some cough -and a burning feeling in chest  Not productive yet -but getting there    Irrigates her sinuses - every day or more if she is sick -with a sterile solution  Taking robitussin   Hx of histoplasmosis with lung nodules    Patient Active Problem List   Diagnosis Date Noted  . Chronic cough 03/21/2014  . Nodule of flexor tendon sheath 02/26/2013  . Histoplasmosis 02/23/2013  . Seasonal allergies 02/23/2013  . GERD (gastroesophageal reflux disease) 02/23/2013  . Anxiety state, unspecified 02/23/2013   Past Medical History  Diagnosis Date  . Histoplasmosis 02/23/2013  . Seasonal allergies 02/23/2013  . GERD (gastroesophageal reflux disease) 02/23/2013  . Anxiety state, unspecified 02/23/2013  . PAC (premature atrial contraction)   . PVC (premature ventricular contraction)   . HPV test positive   . Insomnia   . Hot flashes, menopausal    Past Surgical History  Procedure Laterality Date  . Colposcopy  2010    Negative  . Dilation and curettage of uterus      missed ab  . Nose surgery     Social History  Substance Use Topics  . Smoking status: Former Smoker    Types: Cigarettes    Quit date: 02/20/1990  . Smokeless tobacco: Never Used  . Alcohol Use: 0.0 oz/week    0 Standard drinks or equivalent per week     Comment: rarely   Family History  Problem Relation Age of Onset  . Breast cancer Mother 37   Allergies  Allergen Reactions  . Penicillins Anaphylaxis   Current Outpatient Prescriptions on File Prior to Visit    Medication Sig Dispense Refill  . aspirin 81 MG chewable tablet Chew 81 mg by mouth daily.    . Biotin 3 MG TABS Take by mouth 2 (two) times daily.    . calcium carbonate 1250 MG capsule Take 1,250 mg by mouth 2 (two) times daily with a meal.    . cetirizine (ZYRTEC) 10 MG tablet Take 10 mg by mouth daily.    . diphenhydrAMINE (BENADRYL ALLERGY) 25 mg capsule Take 50 mg by mouth every 6 (six) hours as needed.     . pseudoephedrine (SUDAFED) 30 MG tablet Take 30 mg by mouth as needed for congestion.     No current facility-administered medications on file prior to visit.    Review of Systems Review of Systems  Constitutional: Negative for fever, appetite change,  and unexpected weight change.  ENT pos for sinus pain/cong/rhinorrhea  Eyes: Negative for pain and visual disturbance.  Respiratory: Negative for  shortness of breath.   Cardiovascular: Negative for cp or palpitations    Gastrointestinal: Negative for nausea, diarrhea and constipation.  Genitourinary: Negative for urgency and frequency.  Skin: Negative for pallor or rash   Neurological: Negative for weakness, light-headedness, numbness and headaches.  Hematological: Negative for adenopathy. Does not bruise/bleed easily.  Psychiatric/Behavioral:  Negative for dysphoric mood. The patient is not nervous/anxious.         Objective:   Physical Exam  Constitutional: She appears well-developed and well-nourished. No distress.  HENT:  Head: Normocephalic and atraumatic.  Right Ear: External ear normal.  Left Ear: External ear normal.  Mouth/Throat: Oropharynx is clear and moist. No oropharyngeal exudate.  Nares are injected and congested  L maxillary sinus tenderness -without swelling  Post nasal drip   Eyes: Conjunctivae and EOM are normal. Pupils are equal, round, and reactive to light. Right eye exhibits no discharge. Left eye exhibits no discharge.  Neck: Normal range of motion. Neck supple.  Cardiovascular: Normal rate  and regular rhythm.   Pulmonary/Chest: Effort normal and breath sounds normal. No respiratory distress. She has no wheezes. She has no rales.  Lymphadenopathy:    She has no cervical adenopathy.  Neurological: She is alert. No cranial nerve deficit.  Skin: Skin is warm and dry. No rash noted.  Psychiatric: She has a normal mood and affect.          Assessment & Plan:   Problem List Items Addressed This Visit      Respiratory   Acute sinusitis - Primary    Cover with augmentin In setting of uri and also chronic resp problems  Disc symptomatic care - see instructions on AVS  Update if not starting to improve in a week or if worsening         Relevant Medications   levofloxacin (LEVAQUIN) 500 MG tablet

## 2015-05-24 ENCOUNTER — Ambulatory Visit
Admission: EM | Admit: 2015-05-24 | Discharge: 2015-05-24 | Disposition: A | Discharge status: Home / Self Care | Payer: BLUE CROSS/BLUE SHIELD | Attending: Family Medicine | Admitting: Family Medicine

## 2015-05-24 ENCOUNTER — Encounter: Payer: Self-pay | Admitting: Emergency Medicine

## 2015-05-24 DIAGNOSIS — L02811 Cutaneous abscess of head [any part, except face]: Secondary | ICD-10-CM

## 2015-05-24 MED ORDER — SULFAMETHOXAZOLE-TRIMETHOPRIM 800-160 MG PO TABS: 1.0000 | ORAL_TABLET | Freq: Two times a day (BID) | ORAL | Status: DC

## 2015-05-24 NOTE — ED Notes (Signed)
Patient c/o tender bumps on the back left side of her head behind her ears for the past couple of days.  Patient finished Levaquin a week ago.  Patient denies fevers.  Patient reports chills.

## 2015-05-24 NOTE — ED Provider Notes (Signed)
CSN: [id number]     Arrival date & time 05/24/15  1208 History   First MD Initiated Contact with Patient 05/24/15 1358     Chief Complaint  Patient presents with  . Abscess   (Consider location/radiation/quality/duration/timing/severity/associated sxs/prior Treatment) HPI Comments: 54 yo female with a 2-3 days h/o left lower back scalp area skin bump which had some pus drainage yesterday and left lower back neck swollen, tender glands. Denies any fevers, chills, but states has felt fatigued/low energy. Has been applying warm compresses to area since yesterday.  The history is provided by the patient.    Past Medical History  Diagnosis Date  . Histoplasmosis 02/23/2013  . Seasonal allergies 02/23/2013  . GERD (gastroesophageal reflux disease) 02/23/2013  . Anxiety state, unspecified 02/23/2013  . PAC (premature atrial contraction)   . PVC (premature ventricular contraction)   . HPV test positive   . Insomnia   . Hot flashes, menopausal    Past Surgical History  Procedure Laterality Date  . Colposcopy  2010    Negative  . Dilation and curettage of uterus      missed ab  . Nose surgery     Family History  Problem Relation Age of Onset  . Breast cancer Mother 5   Social History  Substance Use Topics  . Smoking status: Former Smoker    Types: Cigarettes    Quit date: 02/20/1990  . Smokeless tobacco: Never Used  . Alcohol Use: 0.0 oz/week    0 Standard drinks or equivalent per week     Comment: rarely   OB History    Gravida Para Term Preterm AB TAB SAB Ectopic Multiple Living   5 1 1  0 4     1     Review of Systems  Allergies  Penicillins  Home Medications   Prior to Admission medications   Medication Sig Start Date End Date Taking? Authorizing Provider  docusate sodium (COLACE) 100 MG capsule Take 200 mg by mouth 2 (two) times daily.   Yes Historical Provider, MD  Multiple Vitamin (MULTIVITAMIN) tablet Take 1 tablet by mouth daily.   Yes Historical Provider, MD   aspirin 81 MG chewable tablet Chew 81 mg by mouth daily.    Historical Provider, MD  Biotin 3 MG TABS Take by mouth 2 (two) times daily.    Historical Provider, MD  calcium carbonate 1250 MG capsule Take 1,250 mg by mouth 2 (two) times daily with a meal.    Historical Provider, MD  cetirizine (ZYRTEC) 10 MG tablet Take 10 mg by mouth daily.    Historical Provider, MD  diphenhydrAMINE (BENADRYL ALLERGY) 25 mg capsule Take 50 mg by mouth every 6 (six) hours as needed.     Historical Provider, MD  levofloxacin (LEVAQUIN) 500 MG tablet Take 1 tablet (500 mg total) by mouth daily. 05/11/15   Abner Greenspan, MD  pseudoephedrine (SUDAFED) 30 MG tablet Take 30 mg by mouth as needed for congestion.    Historical Provider, MD  sulfamethoxazole-trimethoprim (BACTRIM DS,SEPTRA DS) 800-160 MG tablet Take 1 tablet by mouth 2 (two) times daily. 05/24/15   Norval Gable, MD   Meds Ordered and Administered this Visit  Medications - No data to display  BP 109/73 mmHg  Pulse 85  Temp(Src) 96.2 F (35.7 C) (Tympanic)  Resp 16  Ht 5\' 4"  (1.626 m)  Wt 125 lb (56.7 kg)  BMI 21.45 kg/m2  SpO2 100%  LMP 01/24/2014 No data found.   Physical Exam  Constitutional: She appears well-developed and well-nourished. No distress.  Lymphadenopathy:    She has cervical adenopathy (left posterior cervical tender lymphadenopathy).  Skin: She is not diaphoretic. There is erythema.  Approximately 1.5cm erythematous, slightly raised and tender skin lesion with small amount of dried blood on the left posterior lower scalp skin; no drainage  Nursing note and vitals reviewed.   ED Course  Procedures (including critical care time)  Labs Review Labs Reviewed - No data to display  Imaging Review No results found.   Visual Acuity Review  Right Eye Distance:   Left Eye Distance:   Bilateral Distance:    Right Eye Near:   Left Eye Near:    Bilateral Near:         MDM   1. Abscess, scalp   (small 1.5 cm;  spontaneously drained)   Discharge Medication List as of 05/24/2015  3:11 PM    START taking these medications   Details  sulfamethoxazole-trimethoprim (BACTRIM DS,SEPTRA DS) 800-160 MG tablet Take 1 tablet by mouth 2 (two) times daily., Starting 05/24/2015, Until Discontinued, Print       1.diagnosis reviewed with patient 2. rx as per orders above; reviewed possible side effects, interactions, risks and benefits  3. Recommend supportive treatment with warm compresses to area 4. Follow prn if symptoms worsen or don't improve   Norval Gable, MD 05/24/15 [id number]

## 2015-05-29 ENCOUNTER — Telehealth: Payer: Self-pay | Admitting: Family Medicine

## 2015-05-29 MED ORDER — DOXYCYCLINE HYCLATE 100 MG PO TABS: 100.0000 mg | ORAL_TABLET | Freq: Two times a day (BID) | ORAL | Status: DC

## 2015-05-29 NOTE — ED Notes (Signed)
Patient called (seen 10/30) and states bactrim is only mildly helping; had tried doxycycline in the past with better results. rx sent to her pharmacy for doxycycline as per orders  Norval Gable, MD 05/29/15 1342

## 2015-07-16 ENCOUNTER — Other Ambulatory Visit (INDEPENDENT_AMBULATORY_CARE_PROVIDER_SITE_OTHER): Payer: BLUE CROSS/BLUE SHIELD

## 2015-07-16 DIAGNOSIS — R5383 Other fatigue: Secondary | ICD-10-CM

## 2015-07-16 DIAGNOSIS — Z13220 Encounter for screening for lipoid disorders: Secondary | ICD-10-CM | POA: Diagnosis not present

## 2015-07-16 LAB — HEPATIC FUNCTION PANEL
ALT: 19 U/L (ref 0–35)
AST: 22 U/L (ref 0–37)
Albumin: 4.3 g/dL (ref 3.5–5.2)
Alkaline Phosphatase: 63 U/L (ref 39–117)
BILIRUBIN DIRECT: 0 mg/dL (ref 0.0–0.3)
BILIRUBIN TOTAL: 0.3 mg/dL (ref 0.2–1.2)
Total Protein: 6.7 g/dL (ref 6.0–8.3)

## 2015-07-16 LAB — BASIC METABOLIC PANEL
BUN: 20 mg/dL (ref 6–23)
CALCIUM: 9.9 mg/dL (ref 8.4–10.5)
CO2: 34 mEq/L — ABNORMAL HIGH (ref 19–32)
Chloride: 105 mEq/L (ref 96–112)
Creatinine, Ser: 0.75 mg/dL (ref 0.40–1.20)
GFR: 85.32 mL/min (ref >60.00)
GLUCOSE: 91 mg/dL (ref 70–99)
POTASSIUM: 4 meq/L (ref 3.5–5.1)
Sodium: 145 mEq/L (ref 135–145)

## 2015-07-16 LAB — CBC WITH DIFFERENTIAL/PLATELET
BASOS PCT: 0.7 % (ref 0.0–3.0)
Basophils Absolute: 0 10*3/uL (ref 0.0–0.1)
EOS PCT: 1.3 % (ref 0.0–5.0)
Eosinophils Absolute: 0.1 10*3/uL (ref 0.0–0.7)
HEMATOCRIT: 42.4 % (ref 36.0–46.0)
HEMOGLOBIN: 14.3 g/dL (ref 12.0–15.0)
LYMPHS PCT: 39.2 % (ref 12.0–46.0)
Lymphs Abs: 2.4 10*3/uL (ref 0.7–4.0)
MCHC: 33.7 g/dL (ref 30.0–36.0)
MCV: 92.3 fl (ref 78.0–100.0)
Monocytes Absolute: 0.4 10*3/uL (ref 0.1–1.0)
Monocytes Relative: 7.4 % (ref 3.0–12.0)
NEUTROS ABS: 3.1 10*3/uL (ref 1.4–7.7)
Neutrophils Relative %: 51.4 % (ref 43.0–77.0)
Platelets: 403 10*3/uL — ABNORMAL HIGH (ref 150.0–400.0)
RBC: 4.59 Mil/uL (ref 3.87–5.11)
RDW: 13 % (ref 11.5–15.5)
WBC: 6 10*3/uL (ref 4.0–10.5)

## 2015-07-16 LAB — LIPID PANEL
CHOLESTEROL: 192 mg/dL (ref 0–200)
HDL: 76.2 mg/dL (ref >39.00)
LDL Cholesterol: 105 mg/dL — ABNORMAL HIGH (ref 0–99)
NonHDL: 115.3
TRIGLYCERIDES: 54 mg/dL (ref 0.0–149.0)
Total CHOL/HDL Ratio: 3
VLDL: 10.8 mg/dL (ref 0.0–40.0)

## 2015-07-16 LAB — TSH: TSH: 4.06 u[IU]/mL (ref 0.35–4.50)

## 2015-07-23 ENCOUNTER — Encounter: Payer: Self-pay | Admitting: Family Medicine

## 2015-07-23 ENCOUNTER — Ambulatory Visit (INDEPENDENT_AMBULATORY_CARE_PROVIDER_SITE_OTHER): Payer: BLUE CROSS/BLUE SHIELD | Admitting: Family Medicine

## 2015-07-23 VITALS — BP 96/66 | HR 105 | Temp 98.5°F | Ht 64.5 in | Wt 130.0 lb

## 2015-07-23 DIAGNOSIS — Z0000 Encounter for general adult medical examination without abnormal findings: Secondary | ICD-10-CM

## 2015-07-23 NOTE — Patient Instructions (Signed)
Insomnia:  Melatonin up to 10 mg can be taken 1 hour before sleep very safely every day  Antihistamines: 2 tabs of Benadryl is OK Or can also take 2 Dramamine (get the older version that can cause drowsiness) Or Unisom (doxylamine)

## 2015-07-23 NOTE — Progress Notes (Signed)
Dr. Frederico Hamman T. Song Myre, MD, [facility] [place], [address] Phone: [phone] Fax: [phone]  07/23/2015  Patient: Katie Todd, MRN: [id number], DOB: Jul 17, 1961, 54 y.o.  Primary Physician:  Owens Loffler, MD   Chief Complaint  Patient presents with  . Annual Exam   Subjective:   Katie Todd is a 54 y.o. pleasant patient who presents with the following:  Health Maintenance Summary Reviewed and updated, unless pt declines services.  Tobacco History Reviewed. Non-smoker Alcohol: No concerns, no excessive use Exercise Habits: Some activity, rec at least 30 mins 5 times a week STD concerns: none Drug Use: None Birth control method: n/a Lumps or breast concerns: no Breast Cancer Family History: no  16 hour shift. 2-3 days a week.   Tired some. Not sleeping much at night.   Will get flu shot at pharmacy.   Has some chronic constipation.   2 stool softeners twice a day Dulcolax once a week.   Miralax did not work.   Shingles vaccine.  Question about Zostavax   Health Maintenance  Topic Date Due  . Hepatitis C Screening  1960/12/14  . HIV Screening  09/06/1975  . TETANUS/TDAP  09/06/1979  . INFLUENZA VACCINE  10/23/2015 (Originally 02/23/2015)  . MAMMOGRAM  05/06/2017  . PAP SMEAR  04/29/2018  . COLONOSCOPY  11/15/2020     There is no immunization history on file for this patient. Patient Active Problem List   Diagnosis Date Noted  . Nodule of flexor tendon sheath 02/26/2013  . Histoplasmosis 02/23/2013  . Seasonal allergies 02/23/2013  . GERD (gastroesophageal reflux disease) 02/23/2013  . Anxiety state, unspecified 02/23/2013   Past Medical History  Diagnosis Date  . Histoplasmosis 02/23/2013  . Seasonal allergies 02/23/2013  . GERD (gastroesophageal reflux disease) 02/23/2013  . Anxiety state, unspecified 02/23/2013  . PAC (premature atrial contraction)   . PVC (premature ventricular  contraction)   . HPV test positive   . Insomnia   . Hot flashes, menopausal    Past Surgical History  Procedure Laterality Date  . Colposcopy  2010    Negative  . Dilation and curettage of uterus      missed ab  . Nose surgery     Social History   Social History  . Marital Status: Married    Spouse Name: N/A  . Number of Children: N/A  . Years of Education: N/A   Occupational History  . Not on file.   Social History Main Topics  . Smoking status: Former Smoker    Types: Cigarettes    Quit date: 02/20/1990  . Smokeless tobacco: Never Used  . Alcohol Use: 0.0 oz/week    0 Standard drinks or equivalent per week     Comment: rarely  . Drug Use: No  . Sexual Activity:    Partners: Male    Birth Control/ Protection: Surgical     Comment: tubal ligation   Other Topics Concern  . Not on file   Social History Narrative   Family History  Problem Relation Age of Onset  . Breast cancer Mother 54   Allergies  Allergen Reactions  . Penicillins Anaphylaxis    Medication list has been reviewed and updated.   General: Denies fever, chills, sweats. No significant weight loss. Eyes: Denies blurring,significant itching ENT: Denies earache, sore throat, and hoarseness.  Cardiovascular: Denies chest pains, palpitations, dyspnea on exertion,  Respiratory: Denies cough, dyspnea at rest,wheeezing Breast: no concerns  about lumps GI: Denies nausea, vomiting, diarrhea,  + constipation, no melena, hematochezia GU: Denies dysuria, hematuria, urinary hesitancy, nocturia, denies STD risk, no concerns about discharge Musculoskeletal: Denies back pain, joint pain Derm: Denies rash, itching Neuro: Denies  paresthesias, frequent falls, frequent headaches Psych: Denies depression, anxiety Endocrine: Denies cold intolerance, heat intolerance, polydipsia Heme: Denies enlarged lymph nodes Allergy: No hayfever  Objective:   BP 96/66 mmHg  Pulse 105  Temp(Src) 98.5 F (36.9 C)  (Oral)  Ht 5' 4.5" (1.638 m)  Wt 130 lb (58.968 kg)  BMI 21.98 kg/m2  LMP 01/24/2014 No exam data present  GEN: well developed, well nourished, no acute distress Eyes: conjunctiva and lids normal, PERRLA, EOMI ENT: TM clear, nares clear, oral exam WNL Neck: supple, no lymphadenopathy, no thyromegaly, no JVD Pulm: clear to auscultation and percussion, respiratory effort normal CV: regular rate and rhythm, S1-S2, no murmur, rub or gallop, no bruits Chest: no scars, masses, no lumps BREAST: breast exam declined GI: soft, non-tender; no hepatosplenomegaly, masses; active bowel sounds all quadrants GU: GU exam declined Lymph: no cervical, axillary or inguinal adenopathy MSK: gait normal, muscle tone and strength WNL, no joint swelling, effusions, discoloration, crepitus  SKIN: clear, good turgor, color WNL, no rashes, lesions, or ulcerations Neuro: normal mental status, normal strength, sensation, and motion Psych: alert; oriented to person, place and time, normally interactive and not anxious or depressed in appearance.   All labs reviewed with patient. Lipids:    Component Value Date/Time   CHOL 192 07/16/2015 0823   TRIG 54.0 07/16/2015 0823   HDL 76.20 07/16/2015 0823   VLDL 10.8 07/16/2015 0823   CHOLHDL 3 07/16/2015 0823   CBC: CBC Latest Ref Rng 07/16/2015  WBC 4.0 - 10.5 K/uL 6.0  Hemoglobin 12.0 - 15.0 g/dL 14.3  Hematocrit 36.0 - 46.0 % 42.4  Platelets 150.0 - 400.0 K/uL 403.0(H)    Basic Metabolic Panel:    Component Value Date/Time   NA 145 07/16/2015 0823   K 4.0 07/16/2015 0823   CL 105 07/16/2015 0823   CO2 34* 07/16/2015 0823   BUN 20 07/16/2015 0823   CREATININE 0.75 07/16/2015 0823   GLUCOSE 91 07/16/2015 0823   CALCIUM 9.9 07/16/2015 0823   Hepatic Function Latest Ref Rng 07/16/2015  Total Protein 6.0 - 8.3 g/dL 6.7  Albumin 3.5 - 5.2 g/dL 4.3  AST 0 - 37 U/L 22  ALT 0 - 35 U/L 19  Alk Phosphatase 39 - 117 U/L 63  Total Bilirubin 0.2 - 1.2  mg/dL 0.3  Bilirubin, Direct 0.0 - 0.3 mg/dL 0.0    Lab Results  Component Value Date   TSH 4.06 07/16/2015   No results found.  Assessment and Plan:   Healthcare maintenance  Health Maintenance Exam: The patient's preventative maintenance and recommended screening tests for an annual wellness exam were reviewed in full today. Brought up to date unless services declined.  Counselled on the importance of diet, exercise, and its role in overall health and mortality. The patient's FH and SH was reviewed, including their home life, tobacco status, and drug and alcohol status.  Overall, doing well. Discussed constipation.  Follow-up: No Follow-up on file. Or follow-up in 1 year for complete physical examination  Patient Instructions  Insomnia:  Melatonin up to 10 mg can be taken 1 hour before sleep very safely every day  Antihistamines: 2 tabs of Benadryl is OK Or can also take 2 Dramamine (get the older version that can cause drowsiness)  Or Unisom (doxylamine)      Signed,  Maven Rosander T. Kelsye Loomer, MD   Patient's Medications  New Prescriptions   No medications on file  Previous Medications   ASPIRIN 81 MG CHEWABLE TABLET    Chew 81 mg by mouth daily.   BIOTIN 3 MG TABS    Take by mouth 2 (two) times daily.   CALCIUM CARBONATE 1250 MG CAPSULE    Take 1,250 mg by mouth 2 (two) times daily with a meal.   CETIRIZINE (ZYRTEC) 10 MG TABLET    Take 10 mg by mouth daily.   DIPHENHYDRAMINE (BENADRYL ALLERGY) 25 MG CAPSULE    Take 50 mg by mouth every 6 (six) hours as needed.    DOCUSATE SODIUM (COLACE) 100 MG CAPSULE    Take 200 mg by mouth 2 (two) times daily.   MAGNESIUM 30 MG TABLET    Take 60 mg by mouth 2 (two) times daily.   MULTIPLE VITAMIN (MULTIVITAMIN) TABLET    Take 1 tablet by mouth daily.   PSEUDOEPHEDRINE (SUDAFED) 30 MG TABLET    Take 30 mg by mouth as needed for congestion.  Modified Medications   No medications on file  Discontinued Medications   DOXYCYCLINE  (VIBRA-TABS) 100 MG TABLET    Take 1 tablet (100 mg total) by mouth 2 (two) times daily.   LEVOFLOXACIN (LEVAQUIN) 500 MG TABLET    Take 1 tablet (500 mg total) by mouth daily.   SULFAMETHOXAZOLE-TRIMETHOPRIM (BACTRIM DS,SEPTRA DS) 800-160 MG TABLET    Take 1 tablet by mouth 2 (two) times daily.

## 2015-07-23 NOTE — Progress Notes (Signed)
Pre visit review using our clinic review tool, if applicable. No additional management support is needed unless otherwise documented below in the visit note. 

## 2015-08-07 ENCOUNTER — Ambulatory Visit (INDEPENDENT_AMBULATORY_CARE_PROVIDER_SITE_OTHER): Payer: BLUE CROSS/BLUE SHIELD | Admitting: Family Medicine

## 2015-08-07 ENCOUNTER — Encounter: Payer: Self-pay | Admitting: Family Medicine

## 2015-08-07 VITALS — BP 110/78 | HR 99 | Temp 98.7°F | Ht 64.0 in | Wt 130.0 lb

## 2015-08-07 DIAGNOSIS — J0100 Acute maxillary sinusitis, unspecified: Secondary | ICD-10-CM | POA: Diagnosis not present

## 2015-08-07 MED ORDER — DOXYCYCLINE HYCLATE 100 MG PO TABS: 100.0000 mg | ORAL_TABLET | Freq: Two times a day (BID) | ORAL | Status: DC

## 2015-08-07 NOTE — Assessment & Plan Note (Signed)
Symptoms and severity with history of sinus issues consistent with likely bacterial sinusitis. We will treat with doxycycline as she has tolerated this in the past. She will stay well hydrated. Advised to avoid Sudafed. She's given return precautions.

## 2015-08-07 NOTE — Progress Notes (Signed)
Pre visit review using our clinic review tool, if applicable. No additional management support is needed unless otherwise documented below in the visit note. 

## 2015-08-07 NOTE — Patient Instructions (Signed)
Nice to meet you. You likely have a sinus infection. We will treat this with doxycycline. Please try to stay well hydrated. You should take a probiotic while you're on this. If you develop chest pain, shortness of breath, fever, cough productive of blood, or any new or changing symptoms please seek medical attention.

## 2015-08-07 NOTE — Progress Notes (Signed)
Patient ID: Katie Todd, female   DOB: [birth year], 55 y.o.   MRN: [id number]  Tommi Rumps, MD Phone: [phone]  Katie Todd is a 55 y.o. female who presents today for same-day visit.  Sinusitis: Patient notes several days of nasal congestion and maxillary sinus pressure. She notes pressure is fairly significant in her maxillary sinuses. She notes feeling tired and achy as well. She's been trying to irrigate her sinuses and gets lime green material out. She has no postnasal drip. Has ear popping and fullness. No fevers. No cough. She's been taking Sudafed intermittently. She notes several sinus surgeries in the past. She's taken doxycycline and Levaquin in the past for sinus infections.  PMH: Former smoker.   ROS the history of present illness  Objective  Physical Exam Filed Vitals:   08/07/15 1025  BP: 110/78  Pulse: 99  Temp: 98.7 F (37.1 C)    BP Readings from Last 3 Encounters:  08/07/15 110/78  07/23/15 96/66  05/24/15 109/73   Wt Readings from Last 3 Encounters:  08/07/15 130 lb (58.968 kg)  07/23/15 130 lb (58.968 kg)  05/24/15 125 lb (56.7 kg)    Physical Exam  Constitutional: She is well-developed, well-nourished, and in no distress.  HENT:  Head: Normocephalic and atraumatic.  Right Ear: External ear normal.  Left Ear: External ear normal.  Normal TMs bilaterally, mild posterior oropharyngeal erythema with no exudate or tonsillar swelling, bilateral maxillary sinuses tender to percussion  Eyes: Conjunctivae are normal. Pupils are equal, round, and reactive to light.  Neck: Neck supple.  Cardiovascular: Normal rate, regular rhythm and normal heart sounds.  Exam reveals no gallop and no friction rub.   No murmur heard. Pulmonary/Chest: Effort normal and breath sounds normal. No respiratory distress. She has no wheezes. She has no rales.  Lymphadenopathy:    She has no cervical adenopathy.  Neurological: She is alert. Gait normal.  Skin: Skin is warm  and dry. She is not diaphoretic.     Assessment/Plan: Please see individual problem list.  Sinusitis, acute, maxillary Symptoms and severity with history of sinus issues consistent with likely bacterial sinusitis. We will treat with doxycycline as she has tolerated this in the past. She will stay well hydrated. Advised to avoid Sudafed. She's given return precautions.    Meds ordered this encounter  Medications  . doxycycline (VIBRA-TABS) 100 MG tablet    Sig: Take 1 tablet (100 mg total) by mouth 2 (two) times daily.    Dispense:  14 tablet    Refill:  0    Tommi Rumps

## 2015-10-13 ENCOUNTER — Encounter: Payer: Self-pay | Admitting: Family Medicine

## 2015-10-13 ENCOUNTER — Ambulatory Visit (INDEPENDENT_AMBULATORY_CARE_PROVIDER_SITE_OTHER): Payer: BLUE CROSS/BLUE SHIELD | Admitting: Family Medicine

## 2015-10-13 ENCOUNTER — Telehealth: Payer: Self-pay

## 2015-10-13 VITALS — BP 100/70 | HR 112 | Temp 99.3°F | Ht 64.0 in | Wt 127.5 lb

## 2015-10-13 DIAGNOSIS — R509 Fever, unspecified: Secondary | ICD-10-CM | POA: Diagnosis not present

## 2015-10-13 DIAGNOSIS — J101 Influenza due to other identified influenza virus with other respiratory manifestations: Secondary | ICD-10-CM

## 2015-10-13 LAB — POCT INFLUENZA A/B
INFLUENZA A, POC: NEGATIVE
INFLUENZA B, POC: POSITIVE — AB

## 2015-10-13 MED ORDER — GUAIFENESIN-CODEINE 100-10 MG/5ML PO SYRP: 5.0000 mL | ORAL_SOLUTION | Freq: Every evening | ORAL | Status: DC | PRN

## 2015-10-13 MED ORDER — OSELTAMIVIR PHOSPHATE 75 MG PO CAPS: 75.0000 mg | ORAL_CAPSULE | Freq: Two times a day (BID) | ORAL | Status: DC

## 2015-10-13 NOTE — Progress Notes (Signed)
   Subjective:    Patient ID: Katie Todd, female    DOB: 1961/07/08, 55 y.o.   MRN: [id number]  Cough This is a new problem. The current episode started in the past 7 days. The problem has been waxing and waning. The problem occurs constantly. The cough is productive of purulent sputum. Associated symptoms include a fever, myalgias, nasal congestion, rhinorrhea and a sore throat. Pertinent negatives include no ear pain, shortness of breath or wheezing. Associated symptoms comments: Fever  101.0 and body aches yesterday evening. The symptoms are aggravated by lying down. Risk factors: former remotre smoker.  Fever  Associated symptoms include coughing and a sore throat. Pertinent negatives include no ear pain or wheezing.   Using ibuprofen.  Social History /Family History/Past Medical History reviewed and updated if needed.  Did not have flu shot in last year. Work  Emergency planning/management officer.   Review of Systems  Constitutional: Positive for fever.  HENT: Positive for rhinorrhea and sore throat. Negative for ear pain.   Respiratory: Positive for cough. Negative for shortness of breath and wheezing.   Musculoskeletal: Positive for myalgias.       Objective:   Physical Exam  Constitutional: Vital signs are normal. She appears well-developed and well-nourished. She is cooperative.  Non-toxic appearance. She does not appear ill. No distress.  HENT:  Head: Normocephalic.  Right Ear: Hearing, tympanic membrane, external ear and ear canal normal. Tympanic membrane is not erythematous, not retracted and not bulging.  Left Ear: Hearing, tympanic membrane, external ear and ear canal normal. Tympanic membrane is not erythematous, not retracted and not bulging.  Nose: Mucosal edema and rhinorrhea present. Right sinus exhibits no maxillary sinus tenderness and no frontal sinus tenderness. Left sinus exhibits no maxillary sinus tenderness and no frontal sinus tenderness.  Mouth/Throat: Uvula is midline,  oropharynx is clear and moist and mucous membranes are normal.  Eyes: Conjunctivae, EOM and lids are normal. Pupils are equal, round, and reactive to light. Lids are everted and swept, no foreign bodies found.  Neck: Trachea normal and normal range of motion. Neck supple. Carotid bruit is not present. No thyroid mass and no thyromegaly present.  Cardiovascular: Normal rate, regular rhythm, S1 normal, S2 normal, normal heart sounds, intact distal pulses and normal pulses.  Exam reveals no gallop and no friction rub.   No murmur heard. Pulmonary/Chest: Effort normal and breath sounds normal. No tachypnea. No respiratory distress. She has no decreased breath sounds. She has no wheezes. She has no rhonchi. She has no rales.  Neurological: She is alert.  Skin: Skin is warm, dry and intact. No rash noted.  Psychiatric: Her speech is normal and behavior is normal. Judgment normal. Her mood appears not anxious. Cognition and memory are normal. She does not exhibit a depressed mood.          Assessment & Plan:

## 2015-10-13 NOTE — Patient Instructions (Addendum)
Remain out of work until 24 hours after fever resolved.  Complete course of  Tamiflu. Rest, fluids.  Cough supressant as needed.

## 2015-10-13 NOTE — Telephone Encounter (Signed)
Pt has appt scheduled on 10/14/15 at 8:45 with Allie Bossier NP. Carrie at front desk has info if there is a cancellation for pt to get cb.

## 2015-10-13 NOTE — Assessment & Plan Note (Signed)
Discussed symptomatic care.  Hydration, rest. Call if SOB, cough worsening or prolongued fever. Reviewed flu timeline. She has  health problems that put her at risk for complications , so we decided to use of tamiflu. Pt agreed. Discussed family prophylaxis, sne tin rx for her husband ( [place] pt) tamiflu prophylaxis. She was advised to not return to work until 24 hour after fever resolved on no antipyretics.

## 2015-10-13 NOTE — Progress Notes (Signed)
Pre visit review using our clinic review tool, if applicable. No additional management support is needed unless otherwise documented below in the visit note. 

## 2015-10-13 NOTE — Telephone Encounter (Signed)
PLEASE NOTE: All timestamps contained within this report are represented as [place] Standard Time. CONFIDENTIALTY NOTICE: This fax transmission is intended only for the addressee. It contains information that is legally privileged, confidential or otherwise protected from use or disclosure. If you are not the intended recipient, you are strictly prohibited from reviewing, disclosing, copying using or disseminating any of this information or taking any action in reliance on or regarding this information. If you have received this fax in error, please notify us immediately by telephone so that we can arrange for its return to [place]. Phone: [phone], Toll-Free: [phone], Fax: [phone] Page: 1 of 1 Call Id: [id number] [place] Day - Client Nonclinical Telephone Record [place] Day - Client Client Site [place] - Day Physician [place], [place] Type Call Who Is Calling Patient / Member / Family / Caregiver Caller Name [place] Phone Number [phone] Reason for Call Symptomatic / Request for [place] states c/o headache, productive cough with green mucus, body aches - declined triage, wants to be called if any appts become available today Call Closed By: Philis Kendall Transaction Date/Time: 10/13/2015 8:38:07 AM (ET)

## 2015-10-14 ENCOUNTER — Ambulatory Visit: Payer: BLUE CROSS/BLUE SHIELD | Admitting: Primary Care

## 2015-10-26 ENCOUNTER — Telehealth: Payer: Self-pay | Admitting: Family Medicine

## 2015-10-26 NOTE — Telephone Encounter (Signed)
Pt has taken Benadryl and feeling some better; not as SOB; no fever; pt has been sick for 3 days.Pt does not want to go to a different [facility] office. Pt does not want to go to [facility] but if condition changes or worsens will go to UC.  Offered to make appt for 10/27/15 at [facility] but pt said to wait and see how she feels in AM and if needed she will cb for appt.

## 2015-10-26 NOTE — Telephone Encounter (Signed)
Patient Name: Katie Todd  DOB: [birth year]    Initial Comment Caller states, Dx with Flu 2 weeks ago, got over flu, but now has a cough and chest tightness again , looking for an appt today    Nurse Assessment  Nurse: Raphael Gibney, RN, Vanita Ingles Date/Time (Eastern Time): 10/26/2015 1:31:42 PM  Confirm and document reason for call. If symptomatic, describe symptoms. You must click the next button to save text entered. ---Caller states she was diagnosed with the flu 2 weeks ago. Has had lingering cough. Cough has worsened over the past few days. No fever. Cough is not productive. She is a little SOB and it is hard for her to take a deep breath as her chest feels little tight on the left side. her throat is sore from post nasal drip and her lymph glands are swollen on the left side She has taken Sudafed. No chest pain.  Has the patient traveled out of the country within the last 30 days? ---Not Applicable  Does the patient have any new or worsening symptoms? ---Yes  Will a triage be completed? ---Yes  Related visit to physician within the last 2 weeks? ---Yes  Does the PT have any chronic conditions? (i.e. diabetes, asthma, etc.) ---No  Is this a behavioral health or substance abuse call? ---No     Guidelines    Guideline Title Affirmed Question Affirmed Notes  Cough - Acute Non-Productive [1] Continuous (nonstop) coughing interferes with work or school AND [2] no improvement using cough treatment per protocol    Final Disposition User   See Physician within 4 Hours (or PCP triage) Raphael Gibney, RN, Vera    Comments  Triage outcome upgraded to see physician within 4 hrs as pt is a little SOB. No appts available at [facility] and pt does not want to go to another office or the urgent care. Please call pt back if any cancellations.   Referrals  GO TO FACILITY REFUSED   Disagree/Comply: Disagree  Disagree/Comply Reason: Disagree with instructions

## 2015-10-26 NOTE — Telephone Encounter (Signed)
Many people cough for 2 weeks after getting over flu - this might be possible. I think it is fine for her to see how she feels in the next few days. If she needs an appointment, try to call at 8 AM.

## 2015-11-09 DIAGNOSIS — J310 Chronic rhinitis: Secondary | ICD-10-CM | POA: Diagnosis not present

## 2015-11-09 DIAGNOSIS — J329 Chronic sinusitis, unspecified: Secondary | ICD-10-CM | POA: Diagnosis not present

## 2015-11-09 DIAGNOSIS — J342 Deviated nasal septum: Secondary | ICD-10-CM | POA: Diagnosis not present

## 2015-11-09 DIAGNOSIS — J0141 Acute recurrent pansinusitis: Secondary | ICD-10-CM | POA: Diagnosis not present

## 2015-11-09 DIAGNOSIS — R51 Headache: Secondary | ICD-10-CM | POA: Diagnosis not present

## 2015-11-09 DIAGNOSIS — R05 Cough: Secondary | ICD-10-CM | POA: Diagnosis not present

## 2016-09-09 ENCOUNTER — Telehealth: Payer: Self-pay | Admitting: Family Medicine

## 2016-09-09 DIAGNOSIS — Z23 Encounter for immunization: Secondary | ICD-10-CM

## 2016-09-09 MED ORDER — TETANUS-DIPHTH-ACELL PERTUSSIS 5-2.5-18.5 LF-MCG/0.5 IM SUSP: 0.5000 mL | Freq: Once | INTRAMUSCULAR | 0 refills | Status: AC

## 2016-09-09 NOTE — Telephone Encounter (Signed)
Pt called wanting to know if she could get the whooping cough vaccine.  She is having a new grand baby

## 2016-09-09 NOTE — Telephone Encounter (Signed)
Rx sent as requested.

## 2016-09-09 NOTE — Telephone Encounter (Signed)
Pt wanted to get rx sent to [facility]

## 2016-09-09 NOTE — Telephone Encounter (Signed)
Left message asking pt to call office  °

## 2016-09-09 NOTE — Telephone Encounter (Signed)
Past due for CPE.  Will need schedule appointment with Dr. Lorelei Pont to get vaccine here or they can get the Tdap done at the pharmacy if they don't want to schedule appointment.

## 2017-04-28 ENCOUNTER — Encounter: Payer: Self-pay | Admitting: Family Medicine

## 2017-05-11 ENCOUNTER — Other Ambulatory Visit (INDEPENDENT_AMBULATORY_CARE_PROVIDER_SITE_OTHER): Payer: BLUE CROSS/BLUE SHIELD

## 2017-05-11 DIAGNOSIS — Z0000 Encounter for general adult medical examination without abnormal findings: Secondary | ICD-10-CM | POA: Diagnosis not present

## 2017-05-11 DIAGNOSIS — E559 Vitamin D deficiency, unspecified: Secondary | ICD-10-CM

## 2017-05-11 LAB — LIPID PANEL
Cholesterol: 190 mg/dL (ref 0–200)
HDL: 78.6 mg/dL (ref >39.00)
LDL Cholesterol: 101 mg/dL — ABNORMAL HIGH (ref 0–99)
NonHDL: 111.31
TRIGLYCERIDES: 51 mg/dL (ref 0.0–149.0)
Total CHOL/HDL Ratio: 2
VLDL: 10.2 mg/dL (ref 0.0–40.0)

## 2017-05-11 LAB — CBC WITH DIFFERENTIAL/PLATELET
BASOS ABS: 0 10*3/uL (ref 0.0–0.1)
Basophils Relative: 0.8 % (ref 0.0–3.0)
EOS ABS: 0.1 10*3/uL (ref 0.0–0.7)
Eosinophils Relative: 2.2 % (ref 0.0–5.0)
HCT: 43.2 % (ref 36.0–46.0)
Hemoglobin: 14.6 g/dL (ref 12.0–15.0)
LYMPHS ABS: 2.4 10*3/uL (ref 0.7–4.0)
Lymphocytes Relative: 44.8 % (ref 12.0–46.0)
MCHC: 33.8 g/dL (ref 30.0–36.0)
MCV: 92.7 fl (ref 78.0–100.0)
MONO ABS: 0.5 10*3/uL (ref 0.1–1.0)
Monocytes Relative: 9.2 % (ref 3.0–12.0)
NEUTROS ABS: 2.3 10*3/uL (ref 1.4–7.7)
NEUTROS PCT: 43 % (ref 43.0–77.0)
PLATELETS: 398 10*3/uL (ref 150.0–400.0)
RBC: 4.66 Mil/uL (ref 3.87–5.11)
RDW: 12.9 % (ref 11.5–15.5)
WBC: 5.3 10*3/uL (ref 4.0–10.5)

## 2017-05-11 LAB — TSH: TSH: 4.35 u[IU]/mL (ref 0.35–4.50)

## 2017-05-11 LAB — BASIC METABOLIC PANEL
BUN: 15 mg/dL (ref 6–23)
CHLORIDE: 104 meq/L (ref 96–112)
CO2: 31 mEq/L (ref 19–32)
CREATININE: 0.79 mg/dL (ref 0.40–1.20)
Calcium: 10.1 mg/dL (ref 8.4–10.5)
GFR: 79.82 mL/min (ref >60.00)
Glucose, Bld: 92 mg/dL (ref 70–99)
Potassium: 4.2 mEq/L (ref 3.5–5.1)
Sodium: 143 mEq/L (ref 135–145)

## 2017-05-11 LAB — HEPATIC FUNCTION PANEL
ALK PHOS: 70 U/L (ref 39–117)
ALT: 14 U/L (ref 0–35)
AST: 19 U/L (ref 0–37)
Albumin: 4.5 g/dL (ref 3.5–5.2)
BILIRUBIN DIRECT: 0.1 mg/dL (ref 0.0–0.3)
TOTAL PROTEIN: 6.9 g/dL (ref 6.0–8.3)
Total Bilirubin: 0.4 mg/dL (ref 0.2–1.2)

## 2017-05-11 LAB — VITAMIN D 25 HYDROXY (VIT D DEFICIENCY, FRACTURES): VITD: 46.93 ng/mL (ref 30.00–100.00)

## 2017-05-15 ENCOUNTER — Encounter: Payer: Self-pay | Admitting: *Deleted

## 2017-05-15 ENCOUNTER — Encounter: Payer: Self-pay | Admitting: Family Medicine

## 2017-05-15 ENCOUNTER — Ambulatory Visit (INDEPENDENT_AMBULATORY_CARE_PROVIDER_SITE_OTHER): Payer: BLUE CROSS/BLUE SHIELD | Admitting: Obstetrics & Gynecology

## 2017-05-15 ENCOUNTER — Encounter: Payer: Self-pay | Admitting: Obstetrics & Gynecology

## 2017-05-15 DIAGNOSIS — Z23 Encounter for immunization: Secondary | ICD-10-CM | POA: Diagnosis not present

## 2017-05-15 DIAGNOSIS — Z1151 Encounter for screening for human papillomavirus (HPV): Secondary | ICD-10-CM | POA: Diagnosis not present

## 2017-05-15 DIAGNOSIS — N951 Menopausal and female climacteric states: Secondary | ICD-10-CM

## 2017-05-15 DIAGNOSIS — Z124 Encounter for screening for malignant neoplasm of cervix: Secondary | ICD-10-CM | POA: Diagnosis not present

## 2017-05-15 DIAGNOSIS — Z1231 Encounter for screening mammogram for malignant neoplasm of breast: Secondary | ICD-10-CM

## 2017-05-15 DIAGNOSIS — Z01419 Encounter for gynecological examination (general) (routine) without abnormal findings: Secondary | ICD-10-CM

## 2017-05-15 MED ORDER — MEDROXYPROGESTERONE ACETATE 5 MG PO TABS: 5.0000 mg | ORAL_TABLET | Freq: Every day | ORAL | 3 refills | Status: DC

## 2017-05-15 MED ORDER — ESTROGENS, CONJUGATED 0.625 MG/GM VA CREA: 1.0000 | TOPICAL_CREAM | Freq: Every day | VAGINAL | 12 refills | Status: DC

## 2017-05-15 NOTE — Patient Instructions (Signed)
Preventive Care 40-64 Years, Female Preventive care refers to lifestyle choices and visits with your health care provider that can promote health and wellness. What does preventive care include?  A yearly physical exam. This is also called an annual well check.  Dental exams once or twice a year.  Routine eye exams. Ask your health care provider how often you should have your eyes checked.  Personal lifestyle choices, including: ? Daily care of your teeth and gums. ? Regular physical activity. ? Eating a healthy diet. ? Avoiding tobacco and drug use. ? Limiting alcohol use. ? Practicing safe sex. ? Taking low-dose aspirin daily starting at age 58. ? Taking vitamin and mineral supplements as recommended by your health care provider. What happens during an annual well check? The services and screenings done by your health care provider during your annual well check will depend on your age, overall health, lifestyle risk factors, and family history of disease. Counseling Your health care provider may ask you questions about your:  Alcohol use.  Tobacco use.  Drug use.  Emotional well-being.  Home and relationship well-being.  Sexual activity.  Eating habits.  Work and work Statistician.  Method of birth control.  Menstrual cycle.  Pregnancy history.  Screening You may have the following tests or measurements:  Height, weight, and BMI.  Blood pressure.  Lipid and cholesterol levels. These may be checked every 5 years, or more frequently if you are over 81 years old.  Skin check.  Lung cancer screening. You may have this screening every year starting at age 78 if you have a 30-pack-year history of smoking and currently smoke or have quit within the past 15 years.  Fecal occult blood test (FOBT) of the stool. You may have this test every year starting at age 65.  Flexible sigmoidoscopy or colonoscopy. You may have a sigmoidoscopy every 5 years or a colonoscopy  every 10 years starting at age 30.  Hepatitis C blood test.  Hepatitis B blood test.  Sexually transmitted disease (STD) testing.  Diabetes screening. This is done by checking your blood sugar (glucose) after you have not eaten for a while (fasting). You may have this done every 1-3 years.  Mammogram. This may be done every 1-2 years. Talk to your health care provider about when you should start having regular mammograms. This may depend on whether you have a family history of breast cancer.  BRCA-related cancer screening. This may be done if you have a family history of breast, ovarian, tubal, or peritoneal cancers.  Pelvic exam and Pap test. This may be done every 3 years starting at age 80. Starting at age 36, this may be done every 5 years if you have a Pap test in combination with an HPV test.  Bone density scan. This is done to screen for osteoporosis. You may have this scan if you are at high risk for osteoporosis.  Discuss your test results, treatment options, and if necessary, the need for more tests with your health care provider. Vaccines Your health care provider may recommend certain vaccines, such as:  Influenza vaccine. This is recommended every year.  Tetanus, diphtheria, and acellular pertussis (Tdap, Td) vaccine. You may need a Td booster every 10 years.  Varicella vaccine. You may need this if you have not been vaccinated.  Zoster vaccine. You may need this after age 5.  Measles, mumps, and rubella (MMR) vaccine. You may need at least one dose of MMR if you were born in  1957 or later. You may also need a second dose.  Pneumococcal 13-valent conjugate (PCV13) vaccine. You may need this if you have certain conditions and were not previously vaccinated.  Pneumococcal polysaccharide (PPSV23) vaccine. You may need one or two doses if you smoke cigarettes or if you have certain conditions.  Meningococcal vaccine. You may need this if you have certain  conditions.  Hepatitis A vaccine. You may need this if you have certain conditions or if you travel or work in places where you may be exposed to hepatitis A.  Hepatitis B vaccine. You may need this if you have certain conditions or if you travel or work in places where you may be exposed to hepatitis B.  Haemophilus influenzae type b (Hib) vaccine. You may need this if you have certain conditions.  Talk to your health care provider about which screenings and vaccines you need and how often you need them. This information is not intended to replace advice given to you by your health care provider. Make sure you discuss any questions you have with your health care provider. Document Released: 08/07/2015 Document Revised: 03/30/2016 Document Reviewed: 05/12/2015 Elsevier Interactive Patient Education  2017 [employer].

## 2017-05-15 NOTE — Telephone Encounter (Signed)
Called patient and left voicemail.

## 2017-05-15 NOTE — Progress Notes (Signed)
GYNECOLOGY ANNUAL PREVENTATIVE CARE ENCOUNTER NOTE  Subjective:   Katie Todd is a 56 y.o. [phone] female here for a routine annual gynecologic exam.  Current complaints: vaginal dryness and dyspareunia for several months.  Not alleviated by lubrication. Denies abnormal vaginal bleeding, discharge or other gynecologic concerns.    Gynecologic History Patient's last menstrual period was 01/24/2014. Contraception: none Last Pap: 04/30/2015. Results were: normal  Obstetric History OB History  Gravida Para Term Preterm AB Living  5 1 1  0 4 1  SAB TAB Ectopic Multiple Live Births               # Outcome Date GA Lbr Len/2nd Weight Sex Delivery Anes PTL Lv  5 AB           4 AB           3 AB           2 AB           1 Term               Past Medical History:  Diagnosis Date  . Anxiety state, unspecified 02/23/2013  . GERD (gastroesophageal reflux disease) 02/23/2013  . Histoplasmosis 02/23/2013  . Hot flashes, menopausal   . HPV test positive   . Insomnia   . PAC (premature atrial contraction)   . PVC (premature ventricular contraction)   . Seasonal allergies 02/23/2013    Past Surgical History:  Procedure Laterality Date  . COLPOSCOPY  2010   Negative  . DILATION AND CURETTAGE OF UTERUS     missed ab  . NOSE SURGERY      Current Outpatient Prescriptions on File Prior to Visit  Medication Sig Dispense Refill  . aspirin 81 MG chewable tablet Chew 81 mg by mouth daily.    . Biotin 5000 MCG TABS Take 2 tablets by mouth daily.    . calcium carbonate 1250 MG capsule Take 1,250 mg by mouth 2 (two) times daily with a meal.    . cetirizine (ZYRTEC) 10 MG tablet Take 10 mg by mouth daily.    . diphenhydrAMINE (BENADRYL ALLERGY) 25 mg capsule Take 50 mg by mouth every 6 (six) hours as needed.     . docusate sodium (COLACE) 100 MG capsule Take 200 mg by mouth 2 (two) times daily.    Marland Kitchen doxylamine, Sleep, (UNISOM) 25 MG tablet Take 25 mg by mouth at bedtime as needed.     .  fluticasone (FLONASE) 50 MCG/ACT nasal spray SPRAY 2 SPRAYS IN EACH NOSTRIL NIGHTLY  11  . Magnesium 400 MG TABS Take 1 tablet by mouth 2 (two) times daily.    . Multiple Vitamin (MULTIVITAMIN) tablet Take 1 tablet by mouth daily.    Marland Kitchen guaiFENesin-codeine (ROBITUSSIN AC) 100-10 MG/5ML syrup Take 5-10 mLs by mouth at bedtime as needed for cough. (Patient not taking: Reported on 05/15/2017) 180 mL 0   No current facility-administered medications on file prior to visit.     Allergies  Allergen Reactions  . Penicillins Anaphylaxis    Social History   Social History  . Marital status: Married    Spouse name: N/A  . Number of children: N/A  . Years of education: N/A   Occupational History  . Not on file.   Social History Main Topics  . Smoking status: Former Smoker    Types: Cigarettes    Quit date: 02/20/1990  . Smokeless tobacco: Never Used  . Alcohol use 0.0 oz/week  Comment: rarely  . Drug use: No  . Sexual activity: Yes    Partners: Male    Birth control/ protection: Surgical     Comment: tubal ligation   Other Topics Concern  . Not on file   Social History Narrative  . No narrative on file    Family History  Problem Relation Age of Onset  . Breast cancer Mother 33    The following portions of the patient's history were reviewed and updated as appropriate: allergies, current medications, past family history, past medical history, past social history, past surgical history and problem list.  Review of Systems Pertinent items noted in HPI and remainder of comprehensive ROS otherwise negative.   Objective:  BP 137/86   Pulse 92   Wt 149 lb 12.8 oz (67.9 kg)   LMP 01/24/2014 Comment: october 2014 was the one prior to this July  BMI 25.71 kg/m  CONSTITUTIONAL: Well-developed, well-nourished female in no acute distress.  HENT:  Normocephalic, atraumatic, External right and left ear normal. Oropharynx is clear and moist EYES: Conjunctivae and EOM are normal.  Pupils are equal, round, and reactive to light. No scleral icterus.  NECK: Normal range of motion, supple, no masses.  Normal thyroid.  SKIN: Skin is warm and dry. No rash noted. Not diaphoretic. No erythema. No pallor. NEUROLOGIC: Alert and oriented to person, place, and time. Normal reflexes, muscle tone coordination. No cranial nerve deficit noted. PSYCHIATRIC: Normal mood and affect. Normal behavior. Normal judgment and thought content. CARDIOVASCULAR: Normal heart rate noted, regular rhythm RESPIRATORY: Clear to auscultation bilaterally. Effort and breath sounds normal, no problems with respiration noted. BREASTS: Symmetric in size. No masses, skin changes, nipple drainage, or lymphadenopathy. ABDOMEN: Soft, normal bowel sounds, no distention noted.  No tenderness, rebound or guarding.  PELVIC: Normal appearing external genitalia; moderately atrophic vaginal mucosa and cervix.  No abnormal discharge noted.  Pap smear obtained.  Normal uterine size, no other palpable masses, no uterine or adnexal tenderness. MUSCULOSKELETAL: Normal range of motion. No tenderness.  No cyanosis, clubbing, or edema.  2+ distal pulses.   Assessment and Plan:  1. Menopausal vaginal dryness Discussed topical estrogen vs oral vs Osphena. Side effects discussed. Also recommended cyclic progestin given unopposed estrogen. Will reevaluate in 2 months. - conjugated estrogens (PREMARIN) vaginal cream; Place 1 Applicatorful vaginally daily. For 2 weeks, then use 3 times a week  Dispense: 42.5 g; Refill: 12 - medroxyPROGESTERone (PROVERA) 5 MG tablet; Take 1 tablet (5 mg total) by mouth daily. Use for twelve consecutive days every month while on estrogen therapy  Dispense: 30 tablet; Refill: 3  2. Visit for screening mammogram - MM SCREENING BREAST TOMO BILATERAL; Future Mammogram scheduled  3. Encounter for gynecological examination with Papanicolaou smear of cervix - Cytology - PAP Will follow up results of pap  smear and manage accordingly.  Routine preventative health maintenance measures emphasized. Please refer to After Visit Summary for other counseling recommendations.    Verita Schneiders, MD, [place] Attending Obstetrician & Gynecologist, [place] for [facility]

## 2017-05-15 NOTE — Telephone Encounter (Signed)
error    This encounter was created in error - please disregard.

## 2017-05-15 NOTE — Progress Notes (Signed)
FLU SHOT TODAY  MAMMOGRAM NEEDED

## 2017-05-15 NOTE — Addendum Note (Signed)
Addended by: Phillip Heal, DEMETRICE A on: 05/15/2017 04:45 PM   Modules accepted: Orders

## 2017-05-17 ENCOUNTER — Encounter: Payer: Self-pay | Admitting: Family Medicine

## 2017-05-17 ENCOUNTER — Encounter: Payer: BLUE CROSS/BLUE SHIELD | Admitting: Family Medicine

## 2017-05-17 ENCOUNTER — Ambulatory Visit (INDEPENDENT_AMBULATORY_CARE_PROVIDER_SITE_OTHER): Payer: BLUE CROSS/BLUE SHIELD | Admitting: Family Medicine

## 2017-05-17 VITALS — BP 110/82 | HR 77 | Temp 98.7°F | Ht 63.5 in | Wt 149.5 lb

## 2017-05-17 DIAGNOSIS — I471 Supraventricular tachycardia: Secondary | ICD-10-CM

## 2017-05-17 DIAGNOSIS — Z0000 Encounter for general adult medical examination without abnormal findings: Secondary | ICD-10-CM

## 2017-05-17 LAB — CYTOLOGY - PAP
DIAGNOSIS: NEGATIVE
HPV (WINDOPATH): NOT DETECTED

## 2017-05-17 MED ORDER — METOPROLOL TARTRATE 25 MG PO TABS: 12.5000 mg | ORAL_TABLET | Freq: Two times a day (BID) | ORAL | 5 refills | Status: DC

## 2017-05-17 NOTE — Progress Notes (Signed)
Dr. Frederico Hamman T. Copland, MD, [facility] [place], [address] Phone: [phone] Fax: [phone]  05/17/2017  Patient: Katie Todd, MRN: [id number], DOB: 06-Oct-1960, 56 y.o.  Primary Physician:  Owens Loffler, MD   Chief Complaint  Patient presents with  . Annual Exam   Subjective:   Katie Todd is a 56 y.o. pleasant patient who presents with the following:  Health Maintenance Summary Reviewed and updated, unless pt declines services.  Tobacco History Reviewed. Non-smoker Alcohol: No concerns, no excessive use Exercise Habits: Gym 3-4 days a week STD concerns: none Drug Use: None Birth control method: Menses regular: n/a Lumps or breast concerns: no Breast Cancer Family History: mother  Health Maintenance  Topic Date Due  . Hepatitis C Screening  08-Sep-1960  . HIV Screening  09/06/1975  . MAMMOGRAM  05/06/2017  . PAP SMEAR  05/15/2020  . COLONOSCOPY  11/15/2020  . TETANUS/TDAP  09/15/2026  . INFLUENZA VACCINE  Completed    Immunization History  Administered Date(s) Administered  . Influenza,inj,Quad PF,6+ Mos 05/15/2017  . Tdap 09/15/2016   Patient Active Problem List   Diagnosis Date Noted  . Nodule of flexor tendon sheath 02/26/2013  . Seasonal allergies 02/23/2013  . GERD (gastroesophageal reflux disease) 02/23/2013  . Anxiety state, unspecified 02/23/2013   Past Medical History:  Diagnosis Date  . Anxiety state, unspecified 02/23/2013  . GERD (gastroesophageal reflux disease) 02/23/2013  . Histoplasmosis 2006  . Hot flashes, menopausal   . HPV test positive   . Insomnia   . PAC (premature atrial contraction)   . PVC (premature ventricular contraction)   . Seasonal allergies 02/23/2013   Past Surgical History:  Procedure Laterality Date  . COLPOSCOPY  2010   Negative  . DILATION AND CURETTAGE OF UTERUS     missed ab  . NOSE SURGERY     Social History   Social History  .  Marital status: Married    Spouse name: N/A  . Number of children: N/A  . Years of education: N/A   Occupational History  . Not on file.   Social History Main Topics  . Smoking status: Former Smoker    Types: Cigarettes    Quit date: 02/20/1990  . Smokeless tobacco: Never Used  . Alcohol use 0.0 oz/week     Comment: rarely  . Drug use: No  . Sexual activity: Yes    Partners: Male    Birth control/ protection: Surgical     Comment: tubal ligation   Other Topics Concern  . Not on file   Social History Narrative  . No narrative on file   Family History  Problem Relation Age of Onset  . Breast cancer Mother 60   Allergies  Allergen Reactions  . Penicillins Anaphylaxis    Medication list has been reviewed and updated.   General: Denies fever, chills, sweats. No significant weight loss. Eyes: Denies blurring,significant itching ENT: Denies earache, sore throat, and hoarseness.  Cardiovascular: Denies chest pains, palpitations, dyspnea on exertion,  Respiratory: Denies cough, dyspnea at rest,wheeezing Breast: no concerns about lumps GI: Denies nausea, vomiting, diarrhea, constipation, change in bowel habits, abdominal pain, melena, hematochezia GU: Denies dysuria, hematuria, urinary hesitancy, nocturia, denies STD risk, no concerns about discharge Musculoskeletal: Denies back pain, joint pain Derm: Denies rash, itching Neuro: Denies  paresthesias, frequent falls, frequent headaches Psych: Denies depression, anxiety Endocrine: Denies cold intolerance, heat intolerance, polydipsia Heme: Denies enlarged lymph nodes Allergy: No  hayfever  Objective:   BP 110/82   Pulse 77   Temp 98.7 F (37.1 C) (Oral)   Ht 5' 3.5" (1.613 m)   Wt 149 lb 8 oz (67.8 kg)   LMP 01/24/2014 Comment: october 2014 was the one prior to this July  BMI 26.07 kg/m  No exam data present  GEN: well developed, well nourished, no acute distress Eyes: conjunctiva and lids normal, PERRLA,  EOMI ENT: TM clear, nares clear, oral exam WNL Neck: supple, no lymphadenopathy, no thyromegaly, no JVD Pulm: clear to auscultation and percussion, respiratory effort normal CV: regular rate and rhythm, S1-S2, no murmur, rub or gallop, no bruits Chest: no scars, masses, no lumps BREAST: breast exam declined GI: soft, non-tender; no hepatosplenomegaly, masses; active bowel sounds all quadrants GU: GU exam declined Lymph: no cervical, axillary or inguinal adenopathy MSK: gait normal, muscle tone and strength WNL, no joint swelling, effusions, discoloration, crepitus  SKIN: clear, good turgor, color WNL, no rashes, lesions, or ulcerations Neuro: normal mental status, normal strength, sensation, and motion Psych: alert; oriented to person, place and time, normally interactive and not anxious or depressed in appearance.   All labs reviewed with patient. Lipids:    Component Value Date/Time   CHOL 190 05/11/2017 0914   TRIG 51.0 05/11/2017 0914   HDL 78.60 05/11/2017 0914   VLDL 10.2 05/11/2017 0914   CHOLHDL 2 05/11/2017 0914   CBC: CBC Latest Ref Rng & Units 05/11/2017 07/16/2015  WBC 4.0 - 10.5 K/uL 5.3 6.0  Hemoglobin 12.0 - 15.0 g/dL 14.6 14.3  Hematocrit 36.0 - 46.0 % 43.2 42.4  Platelets 150.0 - 400.0 K/uL 398.0 403.0(H)    Basic Metabolic Panel:    Component Value Date/Time   NA 143 05/11/2017 0914   K 4.2 05/11/2017 0914   CL 104 05/11/2017 0914   CO2 31 05/11/2017 0914   BUN 15 05/11/2017 0914   CREATININE 0.79 05/11/2017 0914   GLUCOSE 92 05/11/2017 0914   CALCIUM 10.1 05/11/2017 0914   Hepatic Function Latest Ref Rng & Units 05/11/2017 07/16/2015  Total Protein 6.0 - 8.3 g/dL 6.9 6.7  Albumin 3.5 - 5.2 g/dL 4.5 4.3  AST 0 - 37 U/L 19 22  ALT 0 - 35 U/L 14 19  Alk Phosphatase 39 - 117 U/L 70 63  Total Bilirubin 0.2 - 1.2 mg/dL 0.4 0.3  Bilirubin, Direct 0.0 - 0.3 mg/dL 0.1 0.0    Lab Results  Component Value Date   TSH 4.35 05/11/2017   No results  found.  Assessment and Plan:   Healthcare maintenance  SVT (supraventricular tachycardia) ([place])  Overall doing well.  Rare SVT, has been a prior problem and seen by cardiology.  She does not like to take any regular medication for this.  I did give her some p.r.n. Metoprolol that she can use if she is symptomatic.  Health Maintenance Exam: The patient's preventative maintenance and recommended screening tests for an annual wellness exam were reviewed in full today. Brought up to date unless services declined.  Counselled on the importance of diet, exercise, and its role in overall health and mortality. The patient's FH and SH was reviewed, including their home life, tobacco status, and drug and alcohol status.  Follow-up in 1 year for physical exam or additional follow-up below.  Follow-up: No Follow-up on file. Or follow-up in 1 year if not noted.  Future Appointments Date Time Provider [place]  05/31/2017 3:00 PM [facility]  07/13/2017 8:15  AM Anyanwu, Sallyanne Havers, MD [facility]    Meds ordered this encounter  Medications  . Flaxseed, Linseed, (FLAX SEED OIL) 1300 MG CAPS  . pseudoephedrine (SUDAFED) 30 MG tablet  . ranitidine (ZANTAC) 150 MG tablet  . metoprolol tartrate (LOPRESSOR) 25 MG tablet    Sig: Take 0.5-1 tablets (12.5-25 mg total) by mouth 2 (two) times daily. Prn tachycardia    Dispense:  30 tablet    Refill:  5   Signed,  Stephan Nelis T. Lakeyia Surber, MD   Allergies as of 05/17/2017      Reactions   Penicillins Anaphylaxis      Medication List       Accurate as of 05/17/17 11:59 PM. Always use your most recent med list.          aspirin 81 MG chewable tablet Chew 81 mg by mouth daily.   BENADRYL ALLERGY 25 mg capsule Generic drug:  diphenhydrAMINE Take 50 mg by mouth every 6 (six) hours as needed.   Biotin 5000 MCG Tabs Take 2 tablets by mouth daily.   calcium carbonate 1250 MG capsule Take 1,250 mg by mouth 2 (two)  times daily with a meal.   cetirizine 10 MG tablet Commonly known as:  ZYRTEC Take 10 mg by mouth daily.   conjugated estrogens vaginal cream Commonly known as:  PREMARIN Place 1 Applicatorful vaginally daily. For 2 weeks, then use 3 times a week   docusate sodium 100 MG capsule Commonly known as:  COLACE Take 200 mg by mouth 2 (two) times daily.   doxylamine (Sleep) 25 MG tablet Commonly known as:  UNISOM Take 25 mg by mouth at bedtime as needed.   Flax Seed Oil 1300 MG Caps   fluticasone 50 MCG/ACT nasal spray Commonly known as:  FLONASE SPRAY 2 SPRAYS IN EACH NOSTRIL NIGHTLY   guaiFENesin-codeine 100-10 MG/5ML syrup Commonly known as:  ROBITUSSIN AC Take 5-10 mLs by mouth at bedtime as needed for cough.   Magnesium 400 MG Tabs Take 1 tablet by mouth 2 (two) times daily.   medroxyPROGESTERone 5 MG tablet Commonly known as:  PROVERA Take 1 tablet (5 mg total) by mouth daily. Use for twelve consecutive days every month while on estrogen therapy   metoprolol tartrate 25 MG tablet Commonly known as:  LOPRESSOR Take 0.5-1 tablets (12.5-25 mg total) by mouth 2 (two) times daily. Prn tachycardia   multivitamin tablet Take 1 tablet by mouth daily.   SUDAFED 30 MG tablet Generic drug:  pseudoephedrine   ZANTAC 150 MG tablet Generic drug:  ranitidine

## 2017-05-17 NOTE — Patient Instructions (Signed)

## 2017-05-18 ENCOUNTER — Encounter: Payer: Self-pay | Admitting: Obstetrics & Gynecology

## 2017-05-21 ENCOUNTER — Encounter: Payer: Self-pay | Admitting: Obstetrics & Gynecology

## 2017-05-25 ENCOUNTER — Other Ambulatory Visit: Payer: Self-pay | Admitting: Obstetrics & Gynecology

## 2017-05-25 DIAGNOSIS — N951 Menopausal and female climacteric states: Secondary | ICD-10-CM

## 2017-05-25 MED ORDER — ESTROGENS, CONJUGATED 0.625 MG/GM VA CREA: 1.0000 | TOPICAL_CREAM | Freq: Every day | VAGINAL | 12 refills | Status: DC

## 2017-05-25 MED ORDER — ESTRADIOL 0.1 MG/GM VA CREA: 1.0000 | TOPICAL_CREAM | Freq: Every day | VAGINAL | 12 refills | Status: DC

## 2017-05-25 NOTE — Progress Notes (Signed)
Premarin Rx printed as per patient request. Also printed alternative Rx for Estrace. Patient can use either one.  Verita Schneiders, MD, [place] Attending [place], [facility] for [employer], [place]

## 2017-05-31 ENCOUNTER — Ambulatory Visit
Admission: RE | Admit: 2017-05-31 | Discharge: 2017-05-31 | Disposition: A | Discharge status: Home / Self Care | Payer: BLUE CROSS/BLUE SHIELD | Source: Ambulatory Visit | Attending: Obstetrics & Gynecology | Admitting: Obstetrics & Gynecology

## 2017-05-31 DIAGNOSIS — Z1231 Encounter for screening mammogram for malignant neoplasm of breast: Secondary | ICD-10-CM

## 2017-07-13 ENCOUNTER — Ambulatory Visit: Payer: BLUE CROSS/BLUE SHIELD | Admitting: Obstetrics & Gynecology

## 2017-07-26 ENCOUNTER — Telehealth: Payer: BLUE CROSS/BLUE SHIELD | Admitting: Family

## 2017-07-26 DIAGNOSIS — J208 Acute bronchitis due to other specified organisms: Secondary | ICD-10-CM

## 2017-07-26 DIAGNOSIS — B9689 Other specified bacterial agents as the cause of diseases classified elsewhere: Secondary | ICD-10-CM

## 2017-07-26 MED ORDER — AZITHROMYCIN 250 MG PO TABS
ORAL_TABLET | ORAL | 0 refills | Status: DC
Start: 2017-07-26 — End: 2017-07-31

## 2017-07-26 MED ORDER — BENZONATATE 100 MG PO CAPS: 100.0000 mg | ORAL_CAPSULE | Freq: Three times a day (TID) | ORAL | 0 refills | Status: DC | PRN

## 2017-07-26 NOTE — Progress Notes (Signed)

## 2017-07-31 ENCOUNTER — Other Ambulatory Visit: Payer: Self-pay

## 2017-07-31 ENCOUNTER — Encounter: Payer: Self-pay | Admitting: Emergency Medicine

## 2017-07-31 ENCOUNTER — Ambulatory Visit
Admission: EM | Admit: 2017-07-31 | Discharge: 2017-07-31 | Disposition: A | Discharge status: Home / Self Care | Payer: BLUE CROSS/BLUE SHIELD | Attending: Family Medicine | Admitting: Family Medicine

## 2017-07-31 DIAGNOSIS — R059 Cough, unspecified: Secondary | ICD-10-CM

## 2017-07-31 DIAGNOSIS — J9801 Acute bronchospasm: Secondary | ICD-10-CM

## 2017-07-31 DIAGNOSIS — J069 Acute upper respiratory infection, unspecified: Secondary | ICD-10-CM | POA: Diagnosis not present

## 2017-07-31 DIAGNOSIS — R05 Cough: Secondary | ICD-10-CM

## 2017-07-31 MED ORDER — PREDNISONE 10 MG PO TABS: ORAL_TABLET | ORAL | 0 refills | Status: DC

## 2017-07-31 MED ORDER — ALBUTEROL SULFATE HFA 108 (90 BASE) MCG/ACT IN AERS: 2.0000 | INHALATION_SPRAY | RESPIRATORY_TRACT | 0 refills | Status: DC | PRN

## 2017-07-31 MED ORDER — DOXYCYCLINE HYCLATE 100 MG PO CAPS: 100.0000 mg | ORAL_CAPSULE | Freq: Two times a day (BID) | ORAL | 0 refills | Status: DC

## 2017-07-31 MED ORDER — GUAIFENESIN-CODEINE 100-10 MG/5ML PO SOLN: 5.0000 mL | Freq: Every evening | ORAL | 0 refills | Status: DC | PRN

## 2017-07-31 NOTE — ED Triage Notes (Signed)
Patient states that she finished her Z-Pack yesterday.  Patient c/o cough and chest congestion for over a week.

## 2017-07-31 NOTE — Discharge Instructions (Signed)
Take medication as prescribed. Rest. Drink plenty of fluids.  ° °Follow up with your primary care physician this week as needed. Return to Urgent care for new or worsening concerns.  ° °

## 2017-07-31 NOTE — ED Provider Notes (Signed)
[facility] ____________________________________________  Time seen: Approximately 4:20 PM  I have reviewed the triage vital signs and the nursing notes.   HISTORY  Chief Complaint Cough   HPI Katie Todd is a 57 y.o. female with a history of histoplasmosis, acid reflux and bronchitis presenting for evaluation of just over 1 week of cough and congestion symptoms.  States initially started with cough and chest congestion but also has had been having nasal congestion and some sinus pressure.  States has been having a lot of greenish yellowish mucus productive from cough as well as some with nasal drainage.  Denies current sinus pain.  Denies sore throat.  States cough tends to come and cough fits with some wheezing sensation with cough.  States history of histoplasmosis with multiple bilateral pulmonary nodules at baseline.  States that she does have some soreness to her chest, back and abdomen from coughing too hard, but denies any pain in absence of coughing.  Denies hemoptysis, recent surgery, recent travel, extremity swelling, palpitations.  Reports history of similar with previous bronchitis infections.  States that last week she did an electronic visit for the same complaint, and states that she was given a Z-Pak and has been taken over-the-counter cough medications.  States no change with Z-Pak and cough continues. Reports her 2 young grandchildren recently had RSV just prior to her sickness onset.  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  States that she may have had a little bit of a fever at initial sickness onset and fever then resolved, until returned this morning.  States that her fever this morning was just over 100.  Denies any other aggravating or alleviating factors.  Denies current chest pain, shortness of breath, abdominal pain, dysuria, extremity pain, extremity swelling or rash. Denies recent sickness. Denies recent antibiotic  use.  Denies renal insufficiency.  Denies chronic cardiac history.  Owens Loffler, MD: PCP   Past Medical History:  Diagnosis Date  . Anxiety state, unspecified 02/23/2013  . GERD (gastroesophageal reflux disease) 02/23/2013  . Histoplasmosis 2006  . Hot flashes, menopausal   . HPV test positive   . Insomnia   . PAC (premature atrial contraction)   . PVC (premature ventricular contraction)   . Seasonal allergies 02/23/2013    Patient Active Problem List   Diagnosis Date Noted  . Nodule of flexor tendon sheath 02/26/2013  . Seasonal allergies 02/23/2013  . GERD (gastroesophageal reflux disease) 02/23/2013  . Anxiety state, unspecified 02/23/2013    Past Surgical History:  Procedure Laterality Date  . COLPOSCOPY  2010   Negative  . DILATION AND CURETTAGE OF UTERUS     missed ab  . NOSE SURGERY       No current facility-administered medications for this encounter.   Current Outpatient Medications:  .  aspirin 81 MG chewable tablet, Chew 81 mg by mouth daily., Disp: , Rfl:  .  benzonatate (TESSALON PERLES) 100 MG capsule, Take 1 capsule (100 mg total) by mouth 3 (three) times daily as needed., Disp: 20 capsule, Rfl: 0 .  Biotin 5000 MCG TABS, Take 2 tablets by mouth daily., Disp: , Rfl:  .  calcium carbonate 1250 MG capsule, Take 1,250 mg by mouth 2 (two) times daily with a meal., Disp: , Rfl:  .  cetirizine (ZYRTEC) 10 MG tablet, Take 10 mg by mouth daily., Disp: , Rfl:  .  conjugated estrogens (PREMARIN) vaginal cream, Place 1 Applicatorful vaginally daily. For 2  weeks, then use 3 times a week, Disp: 42.5 g, Rfl: 12 .  diphenhydrAMINE (BENADRYL ALLERGY) 25 mg capsule, Take 50 mg by mouth every 6 (six) hours as needed. , Disp: , Rfl:  .  docusate sodium (COLACE) 100 MG capsule, Take 200 mg by mouth 2 (two) times daily., Disp: , Rfl:  .  doxylamine, Sleep, (UNISOM) 25 MG tablet, Take 25 mg by mouth at bedtime as needed. , Disp: , Rfl:  .  estradiol (ESTRACE VAGINAL) 0.1 MG/GM  vaginal cream, Place 1 Applicatorful vaginally at bedtime. For two weeks, then use three times a week, Disp: 42.5 g, Rfl: 12 .  Flaxseed, Linseed, (FLAX SEED OIL) 1300 MG CAPS, , Disp: , Rfl:  .  fluticasone (FLONASE) 50 MCG/ACT nasal spray, SPRAY 2 SPRAYS IN EACH NOSTRIL NIGHTLY, Disp: , Rfl: 11 .  Magnesium 400 MG TABS, Take 1 tablet by mouth 2 (two) times daily., Disp: , Rfl:  .  medroxyPROGESTERone (PROVERA) 5 MG tablet, Take 1 tablet (5 mg total) by mouth daily. Use for twelve consecutive days every month while on estrogen therapy, Disp: 30 tablet, Rfl: 3 .  metoprolol tartrate (LOPRESSOR) 25 MG tablet, Take 0.5-1 tablets (12.5-25 mg total) by mouth 2 (two) times daily. Prn tachycardia, Disp: 30 tablet, Rfl: 5 .  Multiple Vitamin (MULTIVITAMIN) tablet, Take 1 tablet by mouth daily., Disp: , Rfl:  .  pseudoephedrine (SUDAFED) 30 MG tablet, , Disp: , Rfl:  .  albuterol (PROVENTIL HFA;VENTOLIN HFA) 108 (90 Base) MCG/ACT inhaler, Inhale 2 puffs into the lungs every 4 (four) hours as needed for wheezing., Disp: 1 Inhaler, Rfl: 0 .  doxycycline (VIBRAMYCIN) 100 MG capsule, Take 1 capsule (100 mg total) by mouth 2 (two) times daily., Disp: 20 capsule, Rfl: 0 .  guaiFENesin-codeine 100-10 MG/5ML syrup, Take 5 mLs by mouth at bedtime as needed for cough. Do not drive or operate machinery as can cause drowsiness., Disp: 75 mL, Rfl: 0 .  predniSONE (DELTASONE) 10 MG tablet, Start 60 mg po day one, then 50 mg po day two, taper by 10 mg daily until complete., Disp: 21 tablet, Rfl: 0 .  ranitidine (ZANTAC) 150 MG tablet, , Disp: , Rfl:   Allergies Penicillins  Family History  Problem Relation Age of Onset  . Breast cancer Mother 4    Social History Social History   Tobacco Use  . Smoking status: Former Smoker    Types: Cigarettes    Last attempt to quit: 02/20/1990    Years since quitting: 27.4  . Smokeless tobacco: Never Used  Substance Use Topics  . Alcohol use: Yes    Alcohol/week: 0.0 oz      Comment: rarely  . Drug use: No    Review of Systems Constitutional: As above. Eyes: No visual changes. ENT: No sore throat. Cardiovascular: Denies chest pain. Respiratory: Denies shortness of breath. Gastrointestinal: No abdominal pain. No nausea, no vomiting.  No diarrhea.  No constipation. Genitourinary: Negative for dysuria. Musculoskeletal: As above.  Skin: Negative for rash. Neurological: Negative for focal weakness or numbness.  ____________________________________________   PHYSICAL EXAM:  VITAL SIGNS: ED Triage Vitals  Enc Vitals Group     BP 07/31/17 1459 134/88     Pulse Rate 07/31/17 1459 (!) 110 recheck 96     Resp 07/31/17 1459 16     Temp 07/31/17 1459 98.6 F (37 C)     Temp Source 07/31/17 1459 Oral     SpO2 07/31/17 1459 100 %  Weight 07/31/17 1459 135 lb (61.2 kg)     Height 07/31/17 1459 5\' 4"  (1.626 m)     Head Circumference --      Peak Flow --      Pain Score 07/31/17 1500 2     Pain Loc --      Pain Edu? --      Excl. in [place]? --     Constitutional: Alert and oriented. Well appearing and in no acute distress. Eyes: Conjunctivae are normal.  Head: Atraumatic.Mild tenderness to palpation bilateral maxillary sinuses.  No frontal sinus tenderness palpation.  No swelling. No erythema.   Ears: no erythema, normal TMs bilaterally.   Nose: nasal congestion with bilateral nasal turbinate erythema and edema.   Mouth/Throat: Mucous membranes are moist.  Oropharynx non-erythematous.No tonsillar swelling or exudate.  Neck: No stridor.  No cervical spine tenderness to palpation. Hematological/Lymphatic/Immunilogical: No cervical lymphadenopathy. Cardiovascular: Normal rate, regular rhythm. Grossly normal heart sounds.  Good peripheral circulation. Respiratory: Normal respiratory effort.  No retractions.  No rhonchi or rales.  Speaks in complete sentences.  Occasional scattered wheezing heard.  Dry intermittent cough noted in room with bronchospasm  wheeze. Good air movement.  No focal area of consolidation. Musculoskeletal: No lower or upper extremity tenderness nor edema.  Bilateral pedal pulses equal and easily palpated. No cervical, thoracic or lumbar tenderness to palpation.  Neurologic:  Normal speech and language. No gross focal neurologic deficits are appreciated. No gait instability. Skin:  Skin is warm, dry and intact. No rash noted. Psychiatric: Mood and affect are normal. Speech and behavior are normal.  ___________________________________________   LABS (all labs ordered are listed, but only abnormal results are displayed)  Labs Reviewed - No data to display ____________________________________________  RADIOLOGY  No results found. ____________________________________________   PROCEDURES Procedures    INITIAL IMPRESSION / ASSESSMENT AND PLAN / ED COURSE  Pertinent labs & imaging results that were available during my care of the patient were reviewed by me and considered in my medical decision making (see chart for details).  Well-appearing patient.  No acute distress.  Suspect recent viral upper respiratory infection, now concern for secondary bacterial infection.  Suspect patient was taking Z-Pak during primarily viral stage.  Discussed with patient will start patient on oral prednisone taper, as needed albuterol inhaler, as needed codeine with guaifenesin and oral doxycycline.  Discussed chest x-ray evaluation with patient, and no clear need of chest x-ray at this time, will defer, patient agrees.  Encourage strict follow-up and have imaging if no improvement.  Encouraged rest, fluids, supportive care.  Discussed strict follow-up and return parameters.Discussed indication, risks and benefits of medications with patient.  Discussed follow up with Primary care physician this week. Discussed follow up and return parameters including no resolution or any worsening concerns. Patient verbalized understanding and agreed  to plan.   ____________________________________________   FINAL CLINICAL IMPRESSION(S) / ED DIAGNOSES  Final diagnoses:  Cough  Upper respiratory tract infection, unspecified type  Bronchospasm     ED Discharge Orders        Ordered    doxycycline (VIBRAMYCIN) 100 MG capsule  2 times daily     07/31/17 1551    predniSONE (DELTASONE) 10 MG tablet     07/31/17 1551    albuterol (PROVENTIL HFA;VENTOLIN HFA) 108 (90 Base) MCG/ACT inhaler  Every 4 hours PRN     07/31/17 1551    guaiFENesin-codeine 100-10 MG/5ML syrup  At bedtime PRN     07/31/17 1551  Note: This dictation was prepared with Dragon dictation along with smaller phrase technology. Any transcriptional errors that result from this process are unintentional.         Marylene Land, NP 07/31/17 1630

## 2017-12-13 ENCOUNTER — Telehealth: Payer: BLUE CROSS/BLUE SHIELD | Admitting: Family Medicine

## 2017-12-13 DIAGNOSIS — J329 Chronic sinusitis, unspecified: Secondary | ICD-10-CM

## 2017-12-13 MED ORDER — DOXYCYCLINE HYCLATE 100 MG PO TABS: 100.0000 mg | ORAL_TABLET | Freq: Two times a day (BID) | ORAL | 0 refills | Status: DC

## 2017-12-13 NOTE — Progress Notes (Signed)

## 2017-12-29 ENCOUNTER — Encounter: Payer: Self-pay | Admitting: Family Medicine

## 2017-12-29 ENCOUNTER — Ambulatory Visit (INDEPENDENT_AMBULATORY_CARE_PROVIDER_SITE_OTHER): Payer: BLUE CROSS/BLUE SHIELD | Admitting: Family Medicine

## 2017-12-29 VITALS — BP 122/66 | HR 91 | Temp 99.0°F | Ht 63.5 in | Wt 150.2 lb

## 2017-12-29 DIAGNOSIS — J0101 Acute recurrent maxillary sinusitis: Secondary | ICD-10-CM

## 2017-12-29 DIAGNOSIS — B9689 Other specified bacterial agents as the cause of diseases classified elsewhere: Secondary | ICD-10-CM | POA: Insufficient documentation

## 2017-12-29 DIAGNOSIS — J019 Acute sinusitis, unspecified: Secondary | ICD-10-CM | POA: Insufficient documentation

## 2017-12-29 MED ORDER — BENZONATATE 100 MG PO CAPS: 100.0000 mg | ORAL_CAPSULE | Freq: Three times a day (TID) | ORAL | 1 refills | Status: DC | PRN

## 2017-12-29 MED ORDER — DOXYCYCLINE HYCLATE 100 MG PO TABS: 100.0000 mg | ORAL_TABLET | Freq: Two times a day (BID) | ORAL | 0 refills | Status: DC

## 2017-12-29 MED ORDER — ALBUTEROL SULFATE HFA 108 (90 BASE) MCG/ACT IN AERS: 2.0000 | INHALATION_SPRAY | RESPIRATORY_TRACT | 1 refills | Status: DC | PRN

## 2017-12-29 MED ORDER — PREDNISONE 10 MG PO TABS: ORAL_TABLET | ORAL | 0 refills | Status: DC

## 2017-12-29 NOTE — Assessment & Plan Note (Signed)
Briefly improved after tx - then symptoms returned Has hx of abn sinus drainage on L  Will tx with doxy for 14 days along with prednisone 30 mg taper guifen prn  tesslaon for cough  Update if not starting to improve in a week or if worsening    Meds ordered this encounter  Medications  . doxycycline (VIBRA-TABS) 100 MG tablet    Sig: Take 1 tablet (100 mg total) by mouth 2 (two) times daily.    Dispense:  28 tablet    Refill:  0  . predniSONE (DELTASONE) 10 MG tablet    Sig: Take 3 pills once daily by mouth for 3 days, then 2 pills once daily for 3 days, then 1 pill once daily for 3 days and then stop    Dispense:  18 tablet    Refill:  0  . albuterol (PROVENTIL HFA;VENTOLIN HFA) 108 (90 Base) MCG/ACT inhaler    Sig: Inhale 2 puffs into the lungs every 4 (four) hours as needed for wheezing.    Dispense:  1 Inhaler    Refill:  1  . benzonatate (TESSALON PERLES) 100 MG capsule    Sig: Take 1 capsule (100 mg total) by mouth 3 (three) times daily as needed. Do not bite pill    Dispense:  30 capsule    Refill:  1

## 2017-12-29 NOTE — Progress Notes (Signed)
Subjective:    Patient ID: Katie Todd, female    DOB: [birth year], 57 y.o.   MRN: [id number]  HPI Here for uri symptoms and facial pain   Started in may with bad sinus congestion  Got worse and worse/ cough and congestion    She had e visit for sinusitis on 5/22 tx with doxy for 10 days  Felt better right away - with clearing of mucous color  Then got worse again- green nasal d/c   Flushes sinuses regularly  Coughs all night at times  Some hoarseness    Hx of histoplasmosis twice in the past - ([place])  Now is hypersensitive to irritants  Avoids scents and chemicals    Has had several nasal surgeries in the past  L side is the area that continues to cause problems  flonase helps some  Patient Active Problem List   Diagnosis Date Noted  . Acute sinusitis 12/29/2017  . Nodule of flexor tendon sheath 02/26/2013  . Seasonal allergies 02/23/2013  . GERD (gastroesophageal reflux disease) 02/23/2013  . Anxiety state, unspecified 02/23/2013   Past Medical History:  Diagnosis Date  . Anxiety state, unspecified 02/23/2013  . GERD (gastroesophageal reflux disease) 02/23/2013  . Histoplasmosis 2006  . Hot flashes, menopausal   . HPV test positive   . Insomnia   . PAC (premature atrial contraction)   . PVC (premature ventricular contraction)   . Seasonal allergies 02/23/2013   Past Surgical History:  Procedure Laterality Date  . COLPOSCOPY  2010   Negative  . DILATION AND CURETTAGE OF UTERUS     missed ab  . NOSE SURGERY     Social History   Tobacco Use  . Smoking status: Former Smoker    Types: Cigarettes    Last attempt to quit: 02/20/1990    Years since quitting: 27.8  . Smokeless tobacco: Never Used  Substance Use Topics  . Alcohol use: Yes    Alcohol/week: 0.0 oz    Comment: rarely  . Drug use: No   Family History  Problem Relation Age of Onset  . Breast cancer Mother 51   Allergies  Allergen Reactions  . Penicillins Anaphylaxis   Current Outpatient  Medications on File Prior to Visit  Medication Sig Dispense Refill  . aspirin 81 MG chewable tablet Chew 81 mg by mouth daily.    . Biotin 5000 MCG TABS Take 2 tablets by mouth daily.    . calcium carbonate 1250 MG capsule Take 1,250 mg by mouth 2 (two) times daily with a meal.    . cetirizine (ZYRTEC) 10 MG tablet Take 10 mg by mouth daily.    . diphenhydrAMINE (BENADRYL ALLERGY) 25 mg capsule Take 50 mg by mouth every 6 (six) hours as needed.     . docusate sodium (COLACE) 100 MG capsule Take 200 mg by mouth 2 (two) times daily.    Marland Kitchen doxylamine, Sleep, (UNISOM) 25 MG tablet Take 25 mg by mouth at bedtime as needed.     . Flaxseed, Linseed, (FLAX SEED OIL) 1300 MG CAPS     . fluticasone (FLONASE) 50 MCG/ACT nasal spray SPRAY 2 SPRAYS IN EACH NOSTRIL NIGHTLY  11  . Magnesium 400 MG TABS Take 1 tablet by mouth 2 (two) times daily.    . metoprolol tartrate (LOPRESSOR) 25 MG tablet Take 0.5-1 tablets (12.5-25 mg total) by mouth 2 (two) times daily. Prn tachycardia 30 tablet 5  . Multiple Vitamin (MULTIVITAMIN) tablet Take 1 tablet by mouth daily.    Marland Kitchen  pseudoephedrine (SUDAFED) 30 MG tablet     . ranitidine (ZANTAC) 150 MG tablet      No current facility-administered medications on file prior to visit.     Review of Systems  Constitutional: Positive for appetite change. Negative for fatigue and fever.  HENT: Positive for congestion, ear pain, postnasal drip, rhinorrhea, sinus pressure and sore throat. Negative for nosebleeds.   Eyes: Negative for pain, redness and itching.  Respiratory: Positive for cough. Negative for shortness of breath and wheezing.   Cardiovascular: Negative for chest pain.  Gastrointestinal: Negative for abdominal pain, diarrhea, nausea and vomiting.  Endocrine: Negative for polyuria.  Genitourinary: Negative for dysuria, frequency and urgency.  Musculoskeletal: Negative for arthralgias and myalgias.  Allergic/Immunologic: Negative for immunocompromised state.    Neurological: Positive for headaches. Negative for dizziness, tremors, syncope, weakness and numbness.  Hematological: Negative for adenopathy. Does not bruise/bleed easily.  Psychiatric/Behavioral: Negative for dysphoric mood. The patient is not nervous/anxious.        Objective:   Physical Exam  Constitutional: She appears well-developed and well-nourished. No distress.  HENT:  Head: Normocephalic and atraumatic.  Right Ear: External ear normal.  Left Ear: External ear normal.  Mouth/Throat: Oropharynx is clear and moist. No oropharyngeal exudate.  Nares are injected and congested  L maxillary sinus tenderness  Post nasal drip    Eyes: Pupils are equal, round, and reactive to light. Conjunctivae and EOM are normal. Right eye exhibits no discharge. Left eye exhibits no discharge.  Neck: Normal range of motion. Neck supple.  Cardiovascular: Normal rate and regular rhythm.  Pulmonary/Chest: Effort normal and breath sounds normal. No stridor. No respiratory distress. She has no wheezes. She has no rales.  Good air exch  No wheeze Few upper airway sounds  Lymphadenopathy:    She has no cervical adenopathy.  Neurological: She is alert. No cranial nerve deficit.  Skin: Skin is warm and dry. No rash noted.  Psychiatric: She has a normal mood and affect.  Pleasant talkative          Assessment & Plan:   Problem List Items Addressed This Visit      Respiratory   Acute sinusitis    Briefly improved after tx - then symptoms returned Has hx of abn sinus drainage on L  Will tx with doxy for 14 days along with prednisone 30 mg taper guifen prn  tesslaon for cough  Update if not starting to improve in a week or if worsening    Meds ordered this encounter  Medications  . doxycycline (VIBRA-TABS) 100 MG tablet    Sig: Take 1 tablet (100 mg total) by mouth 2 (two) times daily.    Dispense:  28 tablet    Refill:  0  . predniSONE (DELTASONE) 10 MG tablet    Sig: Take 3 pills  once daily by mouth for 3 days, then 2 pills once daily for 3 days, then 1 pill once daily for 3 days and then stop    Dispense:  18 tablet    Refill:  0  . albuterol (PROVENTIL HFA;VENTOLIN HFA) 108 (90 Base) MCG/ACT inhaler    Sig: Inhale 2 puffs into the lungs every 4 (four) hours as needed for wheezing.    Dispense:  1 Inhaler    Refill:  1  . benzonatate (TESSALON PERLES) 100 MG capsule    Sig: Take 1 capsule (100 mg total) by mouth 3 (three) times daily as needed. Do not bite pill  Dispense:  30 capsule    Refill:  1         Relevant Medications   doxycycline (VIBRA-TABS) 100 MG tablet   predniSONE (DELTASONE) 10 MG tablet   benzonatate (TESSALON PERLES) 100 MG capsule

## 2017-12-29 NOTE — Patient Instructions (Addendum)
Drink fluids/rest when you can   Take the doxycycline for 14 days  Prednisone taper as directed  Warm compress on face if needed Saline irrigations  Tessalon for cough   Update if not starting to improve in a week or if worsening    Hope you feel better soon

## 2018-03-22 ENCOUNTER — Encounter: Payer: Self-pay | Admitting: Radiology

## 2018-06-04 ENCOUNTER — Other Ambulatory Visit: Payer: Self-pay | Admitting: Family Medicine

## 2018-06-06 ENCOUNTER — Telehealth: Payer: Self-pay | Admitting: Family Medicine

## 2018-06-06 NOTE — Telephone Encounter (Signed)
Left message asking pt to call office  Please schedule CPE with fasting labs prior for Dr. Lorelei Pont.   Please start looking mid jan for appointment

## 2018-06-07 NOTE — Telephone Encounter (Signed)
Labs 1/8 cpx 1/15

## 2018-07-24 ENCOUNTER — Other Ambulatory Visit: Payer: Self-pay | Admitting: Family Medicine

## 2018-07-24 DIAGNOSIS — Z1159 Encounter for screening for other viral diseases: Secondary | ICD-10-CM

## 2018-07-24 DIAGNOSIS — Z0000 Encounter for general adult medical examination without abnormal findings: Secondary | ICD-10-CM

## 2018-08-01 ENCOUNTER — Other Ambulatory Visit (INDEPENDENT_AMBULATORY_CARE_PROVIDER_SITE_OTHER): Payer: 59

## 2018-08-01 DIAGNOSIS — Z1159 Encounter for screening for other viral diseases: Secondary | ICD-10-CM | POA: Diagnosis not present

## 2018-08-01 DIAGNOSIS — Z0000 Encounter for general adult medical examination without abnormal findings: Secondary | ICD-10-CM | POA: Diagnosis not present

## 2018-08-01 LAB — BASIC METABOLIC PANEL
BUN: 15 mg/dL (ref 6–23)
CO2: 31 meq/L (ref 19–32)
Calcium: 10.1 mg/dL (ref 8.4–10.5)
Chloride: 103 mEq/L (ref 96–112)
Creatinine, Ser: 0.72 mg/dL (ref 0.40–1.20)
GFR: 88.46 mL/min (ref >60.00)
GLUCOSE: 74 mg/dL (ref 70–99)
Potassium: 3.8 mEq/L (ref 3.5–5.1)
Sodium: 141 mEq/L (ref 135–145)

## 2018-08-01 LAB — HEMOGLOBIN A1C: Hgb A1c MFr Bld: 5.4 % (ref 4.6–6.5)

## 2018-08-01 LAB — CBC WITH DIFFERENTIAL/PLATELET
Basophils Absolute: 0.1 10*3/uL (ref 0.0–0.1)
Basophils Relative: 1.2 % (ref 0.0–3.0)
Eosinophils Absolute: 0.1 10*3/uL (ref 0.0–0.7)
Eosinophils Relative: 1.9 % (ref 0.0–5.0)
HCT: 43.2 % (ref 36.0–46.0)
Hemoglobin: 14.5 g/dL (ref 12.0–15.0)
Lymphocytes Relative: 41.2 % (ref 12.0–46.0)
Lymphs Abs: 2.3 10*3/uL (ref 0.7–4.0)
MCHC: 33.6 g/dL (ref 30.0–36.0)
MCV: 91.6 fl (ref 78.0–100.0)
Monocytes Absolute: 0.4 10*3/uL (ref 0.1–1.0)
Monocytes Relative: 8 % (ref 3.0–12.0)
Neutro Abs: 2.7 10*3/uL (ref 1.4–7.7)
Neutrophils Relative %: 47.7 % (ref 43.0–77.0)
Platelets: 375 10*3/uL (ref 150.0–400.0)
RBC: 4.72 Mil/uL (ref 3.87–5.11)
RDW: 13.2 % (ref 11.5–15.5)
WBC: 5.6 10*3/uL (ref 4.0–10.5)

## 2018-08-01 LAB — LIPID PANEL
CHOLESTEROL: 199 mg/dL (ref 0–200)
HDL: 72.8 mg/dL (ref >39.00)
LDL Cholesterol: 113 mg/dL — ABNORMAL HIGH (ref 0–99)
NonHDL: 126.47
Total CHOL/HDL Ratio: 3
Triglycerides: 65 mg/dL (ref 0.0–149.0)
VLDL: 13 mg/dL (ref 0.0–40.0)

## 2018-08-01 LAB — HEPATIC FUNCTION PANEL
ALBUMIN: 4.5 g/dL (ref 3.5–5.2)
ALT: 19 U/L (ref 0–35)
AST: 24 U/L (ref 0–37)
Alkaline Phosphatase: 65 U/L (ref 39–117)
Bilirubin, Direct: 0.1 mg/dL (ref 0.0–0.3)
Total Bilirubin: 0.6 mg/dL (ref 0.2–1.2)
Total Protein: 7.2 g/dL (ref 6.0–8.3)

## 2018-08-01 LAB — TSH: TSH: 5.37 u[IU]/mL — ABNORMAL HIGH (ref 0.35–4.50)

## 2018-08-02 ENCOUNTER — Other Ambulatory Visit: Payer: Self-pay | Admitting: *Deleted

## 2018-08-02 LAB — HEPATITIS C ANTIBODY
Hepatitis C Ab: NONREACTIVE
SIGNAL TO CUT-OFF: 0.1 (ref <1.00)

## 2018-08-05 NOTE — Progress Notes (Signed)
Dr. Frederico Hamman T. Ronnie Mallette, MD, [facility] [place], [address] Phone: [phone] Fax: [phone]  08/08/2018  Patient: Katie Todd, MRN: [id number], DOB: Jan 27, 1961, 58 y.o.  Primary Physician:  Owens Loffler, MD   Chief Complaint  Patient presents with  . Annual Exam   Subjective:   Katie Todd is a 58 y.o. pleasant patient who presents with the following:  Health Maintenance Summary Reviewed and updated, unless pt declines services.  Tobacco History Reviewed. Non-smoker Alcohol: No concerns, no excessive use Exercise Habits: Some activity, rec at least 30 mins 5 times a week STD concerns: none Drug Use: None Birth control method: BTL Menses regular: n/a Lumps or breast concerns: no Breast Cancer Family History: Mom had breast cancer at age 62  She is globally doing well.  Feels tired some. Not sleeping well. Cut back to 2 sixteen hours a day.  Going to retire in February.   Some SOB Big thermos of caffeine  Screening with FH of aneurysm.  Health Maintenance  Topic Date Due  . HIV Screening  09/06/1975  . MAMMOGRAM  06/01/2019  . PAP SMEAR-Modifier  05/15/2020  . COLONOSCOPY  11/15/2020  . TETANUS/TDAP  09/15/2026  . INFLUENZA VACCINE  Completed  . Hepatitis C Screening  Completed   Immunization History  Administered Date(s) Administered  . Influenza,inj,Quad PF,6+ Mos 05/15/2017, 06/08/2018  . Tdap 09/15/2016   Patient Active Problem List   Diagnosis Date Noted  . Family history of abdominal aortic aneurysm (AAA) x 2 d/c, [place] 08/08/2018  . Nodule of flexor tendon sheath 02/26/2013  . Seasonal allergies 02/23/2013  . GERD (gastroesophageal reflux disease) 02/23/2013  . Anxiety state, unspecified 02/23/2013   Past Medical History:  Diagnosis Date  . Anxiety state, unspecified 02/23/2013  . GERD (gastroesophageal reflux disease) 02/23/2013  . Histoplasmosis 2006  . Hot flashes,  menopausal   . HPV test positive   . Insomnia   . PAC (premature atrial contraction)   . PVC (premature ventricular contraction)   . Seasonal allergies 02/23/2013   Past Surgical History:  Procedure Laterality Date  . COLPOSCOPY  2010   Negative  . DILATION AND CURETTAGE OF UTERUS     missed ab  . NOSE SURGERY     Social History   Socioeconomic History  . Marital status: Married    Spouse name: Not on file  . Number of children: Not on file  . Years of education: Not on file  . Highest education level: Not on file  Occupational History  . Not on file  Social Needs  . Financial resource strain: Not on file  . Food insecurity:    Worry: Not on file    Inability: Not on file  . Transportation needs:    Medical: Not on file    Non-medical: Not on file  Tobacco Use  . Smoking status: Former Smoker    Types: Cigarettes    Last attempt to quit: 02/20/1990    Years since quitting: 28.4  . Smokeless tobacco: Never Used  Substance and Sexual Activity  . Alcohol use: Yes    Alcohol/week: 0.0 standard drinks    Comment: rarely  . Drug use: No  . Sexual activity: Yes    Partners: Male    Birth control/protection: Surgical    Comment: tubal ligation  Lifestyle  . Physical activity:    Days per week: Not on file    Minutes per session: Not  on file  . Stress: Not on file  Relationships  . Social connections:    Talks on phone: Not on file    Gets together: Not on file    Attends religious service: Not on file    Active member of club or organization: Not on file    Attends meetings of clubs or organizations: Not on file    Relationship status: Not on file  . Intimate partner violence:    Fear of current or ex partner: Not on file    Emotionally abused: Not on file    Physically abused: Not on file    Forced sexual activity: Not on file  Other Topics Concern  . Not on file  Social History Narrative  . Not on file   Family History  Problem Relation Age of Onset  .  Breast cancer Mother 15   Allergies  Allergen Reactions  . Penicillins Anaphylaxis   Medication list has been reviewed and updated.  General: Denies fever, chills, sweats. No significant weight loss. Eyes: Denies blurring,significant itching ENT: Denies earache, sore throat, and hoarseness.  Cardiovascular: Denies chest pains, palpitations, dyspnea on exertion,  Respiratory: Denies cough, dyspnea at rest,wheeezing Breast: no concerns about lumps GI: Denies nausea, vomiting, diarrhea, constipation, change in bowel habits, abdominal pain, melena, hematochezia GU: Denies dysuria, hematuria, urinary hesitancy, nocturia, denies STD risk, no concerns about discharge Musculoskeletal: Denies back pain, joint pain Derm: Denies rash, itching Neuro: Denies  paresthesias, frequent falls, frequent headaches Psych: Denies depression, anxiety Endocrine: Denies cold intolerance, heat intolerance, polydipsia Heme: Denies enlarged lymph nodes Allergy: No hayfever  Objective:   BP 120/70   Pulse 72   Temp 98.3 F (36.8 C) (Oral)   Ht _0  (1.626 m)   Wt 154 lb 8 oz (70.1 kg)   LMP 01/24/2014 Comment: october 2014 was the one prior to this July  BMI 26.52 kg/m  No exam data present  GEN: well developed, well nourished, no acute distress Eyes: conjunctiva and lids normal, PERRLA, EOMI ENT: TM clear, nares clear, oral exam WNL Neck: supple, no lymphadenopathy, no thyromegaly, no JVD Pulm: clear to auscultation and percussion, respiratory effort normal CV: regular rate and rhythm, S1-S2, no murmur, rub or gallop, no bruits Chest: no scars, masses, no lumps BREAST: breast exam declined GI: soft, non-tender; no hepatosplenomegaly, masses; active bowel sounds all quadrants GU: GU exam declined Lymph: no cervical, axillary or inguinal adenopathy MSK: gait normal, muscle tone and strength WNL, no joint swelling, effusions, discoloration, crepitus  SKIN: clear, good turgor, color WNL, no rashes,  lesions, or ulcerations Neuro: normal mental status, normal strength, sensation, and motion Psych: alert; oriented to person, place and time, normally interactive and not anxious or depressed in appearance.   All labs reviewed with patient. Lipids: Lab Results  Component Value Date   CHOL 199 08/01/2018   Lab Results  Component Value Date   HDL 72.80 08/01/2018   Lab Results  Component Value Date   LDLCALC 113 (H) 08/01/2018   Lab Results  Component Value Date   TRIG 65.0 08/01/2018   Lab Results  Component Value Date   CHOLHDL 3 08/01/2018   CBC: CBC Latest Ref Rng & Units 08/01/2018 05/11/2017 07/16/2015  WBC 4.0 - 10.5 K/uL 5.6 5.3 6.0  Hemoglobin 12.0 - 15.0 g/dL 14.5 14.6 14.3  Hematocrit 36.0 - 46.0 % 43.2 43.2 42.4  Platelets 150.0 - 400.0 K/uL 375.0 398.0 403.0(H)    Basic Metabolic Panel:  Component Value Date/Time   NA 141 08/01/2018 0749   K 3.8 08/01/2018 0749   CL 103 08/01/2018 0749   CO2 31 08/01/2018 0749   BUN 15 08/01/2018 0749   CREATININE 0.72 08/01/2018 0749   GLUCOSE 74 08/01/2018 0749   CALCIUM 10.1 08/01/2018 0749   Hepatic Function Latest Ref Rng & Units 08/01/2018 05/11/2017 07/16/2015  Total Protein 6.0 - 8.3 g/dL 7.2 6.9 6.7  Albumin 3.5 - 5.2 g/dL 4.5 4.5 4.3  AST 0 - 37 U/L _0 ALT 0 - 35 U/L _1 Alk Phosphatase 39 - 117 U/L 65 70 63  Total Bilirubin 0.2 - 1.2 mg/dL 0.6 0.4 0.3  Bilirubin, Direct 0.0 - 0.3 mg/dL 0.1 0.1 0.0    Lab Results  Component Value Date   HGBA1C 5.4 08/01/2018   Lab Results  Component Value Date   TSH 5.37 (H) 08/01/2018   No results found.  Assessment and Plan:   Routine general medical examination at a health care facility  Abnormal TSH - Plan: T4, free, T3, free, TSH  Family history of abdominal aortic aneurysm (AAA) x 2 d/c, [place]  Recheck thyroid after retirement  Health Maintenance Exam: The patient's preventative maintenance and recommended screening tests for an  annual wellness exam were reviewed in full today. Brought up to date unless services declined.  Counselled on the importance of diet, exercise, and its role in overall health and mortality. The patient's FH and SH was reviewed, including their home life, tobacco status, and drug and alcohol status.  Follow-up in 1 year for physical exam or additional follow-up below.  Follow-up: Return in about 11 weeks (around 10/24/2018) for Thyroid lab recheck. Or follow-up in 1 year if not noted.  Signed,  Maud Deed. Amyrie Illingworth, MD   Allergies as of 08/08/2018      Reactions   Penicillins Anaphylaxis      Medication List       Accurate as of August 08, 2018  9:49 AM. Always use your most recent med list.        albuterol 108 (90 Base) MCG/ACT inhaler Commonly known as:  PROVENTIL HFA;VENTOLIN HFA Inhale 2 puffs into the lungs every 4 (four) hours as needed for wheezing.   aspirin 81 MG chewable tablet Chew 81 mg by mouth daily.   BENADRYL ALLERGY 25 mg capsule Generic drug:  diphenhydrAMINE Take 50 mg by mouth every 6 (six) hours as needed.   benzonatate 100 MG capsule Commonly known as:  TESSALON PERLES Take 1 capsule (100 mg total) by mouth 3 (three) times daily as needed. Do not bite pill   Biotin 5000 MCG Tabs Take 2 tablets by mouth daily.   calcium carbonate 1250 MG capsule Take 1,250 mg by mouth 2 (two) times daily with a meal.   cetirizine 10 MG tablet Commonly known as:  ZYRTEC Take 10 mg by mouth daily.   docusate sodium 100 MG capsule Commonly known as:  COLACE Take 200 mg by mouth 2 (two) times daily.   doxylamine (Sleep) 25 MG tablet Commonly known as:  UNISOM Take 25 mg by mouth at bedtime as needed.   Flax Seed Oil 1300 MG Caps   fluticasone 50 MCG/ACT nasal spray Commonly known as:  FLONASE SPRAY 2 SPRAYS IN EACH NOSTRIL NIGHTLY   Magnesium 400 MG Tabs Take 1 tablet by mouth 2 (two) times daily.   metoprolol tartrate 25 MG tablet Commonly known as:   LOPRESSOR TAKE 0.5-1  TABLETS BY MOUTH 2 TIMES DAILY.AS NEEDED FOR TACHYCARDIA   multivitamin tablet Take 1 tablet by mouth daily.   SUDAFED 30 MG tablet Generic drug:  pseudoephedrine   ZANTAC 150 MG tablet Generic drug:  ranitidine

## 2018-08-06 ENCOUNTER — Encounter: Payer: Self-pay | Admitting: *Deleted

## 2018-08-08 ENCOUNTER — Encounter: Payer: Self-pay | Admitting: Family Medicine

## 2018-08-08 ENCOUNTER — Ambulatory Visit (INDEPENDENT_AMBULATORY_CARE_PROVIDER_SITE_OTHER): Payer: 59 | Admitting: Family Medicine

## 2018-08-08 VITALS — BP 120/70 | HR 72 | Temp 98.3°F | Ht 64.0 in | Wt 154.5 lb

## 2018-08-08 DIAGNOSIS — Z8249 Family history of ischemic heart disease and other diseases of the circulatory system: Secondary | ICD-10-CM | POA: Diagnosis not present

## 2018-08-08 DIAGNOSIS — Z0000 Encounter for general adult medical examination without abnormal findings: Secondary | ICD-10-CM | POA: Diagnosis not present

## 2018-08-08 DIAGNOSIS — R7989 Other specified abnormal findings of blood chemistry: Secondary | ICD-10-CM | POA: Diagnosis not present

## 2018-08-13 ENCOUNTER — Telehealth: Payer: Self-pay | Admitting: Family Medicine

## 2018-08-13 NOTE — Telephone Encounter (Signed)
Pt called office due to being charged for her labs she had for the physical. She wanted to know how it was billed.

## 2018-08-14 NOTE — Telephone Encounter (Signed)
I have sent this over to coder and I will follow up with pt when I get answers.

## 2018-08-15 NOTE — Telephone Encounter (Signed)
Coding replied and said the 08/08/18 labs were coded with Z00.00, so they were correct. I spoke with pt and she is aware.

## 2018-08-29 ENCOUNTER — Other Ambulatory Visit: Payer: Self-pay | Admitting: Family Medicine

## 2018-10-11 ENCOUNTER — Encounter: Payer: Self-pay | Admitting: Family Medicine

## 2018-10-11 ENCOUNTER — Ambulatory Visit (INDEPENDENT_AMBULATORY_CARE_PROVIDER_SITE_OTHER): Payer: BLUE CROSS/BLUE SHIELD | Admitting: Family Medicine

## 2018-10-11 ENCOUNTER — Other Ambulatory Visit: Payer: Self-pay

## 2018-10-11 VITALS — BP 100/60 | HR 81 | Temp 98.3°F | Ht 64.0 in | Wt 158.1 lb

## 2018-10-11 DIAGNOSIS — M791 Myalgia, unspecified site: Secondary | ICD-10-CM | POA: Diagnosis not present

## 2018-10-11 DIAGNOSIS — J029 Acute pharyngitis, unspecified: Secondary | ICD-10-CM | POA: Diagnosis not present

## 2018-10-11 MED ORDER — GUAIFENESIN-CODEINE 100-10 MG/5ML PO SYRP: 5.0000 mL | ORAL_SOLUTION | Freq: Every evening | ORAL | 0 refills | Status: DC | PRN

## 2018-10-11 NOTE — Assessment & Plan Note (Signed)
LOW RISK for COVID19 No red flags.  neg flu and strep.  Likely viral URI.Marland Kitchen symptomatic care. Encouraged social distancing/isolation until symptoms improved.

## 2018-10-11 NOTE — Patient Instructions (Signed)
Rest. Fluids. Cough suppressant at night as needed. Can use mucinex during the day. Call if not improving as expected. Got to ER if severe shortness of breath.

## 2018-10-11 NOTE — Progress Notes (Signed)
Subjective:    Patient ID: Katie Todd, female    DOB: 1961-02-06, 58 y.o.   MRN: [id number]  Sore Throat   Associated symptoms include coughing, ear pain and headaches. Pertinent negatives include no shortness of breath.  Cough  This is a new problem. The current episode started in the past 7 days (4 daysa go). The cough is non-productive (mild). Associated symptoms include ear pain, headaches, myalgias and a sore throat. Pertinent negatives include no nasal congestion, postnasal drip, shortness of breath or wheezing. Associated symptoms comments: Right ear pain, right throat pain, fairly severe. Risk factors: non smoker. Treatments tried: NSAIDs. The treatment provided mild relief. There is no history of asthma, COPD or environmental allergies.    Low risk for COVID19 per questionnaire but did have exposure to people traveling from [place] and [place] in last 10 days.. none of those people are sick.  Social History /Family History/Past Medical History reviewed in detail and updated in EMR if needed. Blood pressure 100/60, pulse 81, temperature 98.3 F (36.8 C), height 5\' 4"  (1.626 m), weight 158 lb 1.9 oz (71.7 kg), last menstrual period 01/24/2014, SpO2 98 %. BP Readings from Last 3 Encounters:  10/11/18 100/60  08/08/18 120/70  12/29/17 122/66    Review of Systems  HENT: Positive for ear pain and sore throat. Negative for postnasal drip.   Respiratory: Positive for cough. Negative for shortness of breath and wheezing.   Musculoskeletal: Positive for myalgias.  Allergic/Immunologic: Negative for environmental allergies.  Neurological: Positive for headaches.       Objective:   Physical Exam Constitutional:      General: She is not in acute distress.    Appearance: Normal appearance. She is well-developed. She is not ill-appearing or toxic-appearing.  HENT:     Head: Normocephalic.     Right Ear: Hearing, tympanic membrane, ear canal and external ear normal. Tympanic membrane is  not erythematous, retracted or bulging.     Left Ear: Hearing, tympanic membrane, ear canal and external ear normal. Tympanic membrane is not erythematous, retracted or bulging.     Nose: No mucosal edema or rhinorrhea.     Right Sinus: No maxillary sinus tenderness or frontal sinus tenderness.     Left Sinus: No maxillary sinus tenderness or frontal sinus tenderness.     Mouth/Throat:     Pharynx: Uvula midline. Posterior oropharyngeal erythema present.     Tonsils: No tonsillar exudate.  Eyes:     General: Lids are normal. Lids are everted, no foreign bodies appreciated.     Conjunctiva/sclera: Conjunctivae normal.     Pupils: Pupils are equal, round, and reactive to light.  Neck:     Musculoskeletal: Normal range of motion and neck supple.     Thyroid: No thyroid mass or thyromegaly.     Vascular: No carotid bruit.     Trachea: Trachea normal.  Cardiovascular:     Rate and Rhythm: Normal rate and regular rhythm.     Pulses: Normal pulses.     Heart sounds: Normal heart sounds, S1 normal and S2 normal. No murmur. No friction rub. No gallop.   Pulmonary:     Effort: Pulmonary effort is normal. No tachypnea or respiratory distress.     Breath sounds: Normal breath sounds. No decreased breath sounds, wheezing, rhonchi or rales.  Abdominal:     General: Bowel sounds are normal.     Palpations: Abdomen is soft.     Tenderness: There is no abdominal tenderness.  Skin:    General: Skin is warm and dry.     Findings: No rash.  Neurological:     Mental Status: She is alert.  Psychiatric:        Mood and Affect: Mood is not anxious or depressed.        Speech: Speech normal.        Behavior: Behavior normal. Behavior is cooperative.        Thought Content: Thought content normal.        Judgment: Judgment normal.           Assessment & Plan:

## 2018-10-12 MED ORDER — AZITHROMYCIN 250 MG PO TABS: ORAL_TABLET | ORAL | 0 refills | Status: DC

## 2018-10-24 ENCOUNTER — Other Ambulatory Visit: Payer: 59

## 2018-11-30 ENCOUNTER — Other Ambulatory Visit: Payer: Self-pay | Admitting: Family Medicine

## 2018-12-07 ENCOUNTER — Other Ambulatory Visit: Payer: Self-pay | Admitting: *Deleted

## 2018-12-07 ENCOUNTER — Encounter: Payer: Self-pay | Admitting: *Deleted

## 2018-12-12 ENCOUNTER — Other Ambulatory Visit: Payer: Self-pay | Admitting: Family Medicine

## 2019-04-15 ENCOUNTER — Encounter: Payer: Self-pay | Admitting: Internal Medicine

## 2019-04-15 ENCOUNTER — Ambulatory Visit (INDEPENDENT_AMBULATORY_CARE_PROVIDER_SITE_OTHER): Payer: BC Managed Care – PPO | Admitting: Internal Medicine

## 2019-04-15 DIAGNOSIS — J0100 Acute maxillary sinusitis, unspecified: Secondary | ICD-10-CM | POA: Diagnosis not present

## 2019-04-15 MED ORDER — DOXYCYCLINE HYCLATE 100 MG PO TABS: 100.0000 mg | ORAL_TABLET | Freq: Two times a day (BID) | ORAL | 1 refills | Status: DC

## 2019-04-15 NOTE — Assessment & Plan Note (Signed)
Fairly classic presentation for her --3 weeks of stuttering symptoms but just not going away Will treat with doxy Discussed COVID testing---she knows to quarantine due to symptoms until they are gone for 1-2 days

## 2019-04-15 NOTE — Progress Notes (Signed)
Subjective:    Patient ID: Katie Todd, female    DOB: May 01, 1961, 58 y.o.   MRN: [id number]  HPI Virtual visit for 3 weeks of respiratory symptoms Identification done Reviewed billing and she gave consent  She is in her home and I am in my office  Started in sinuses ---gets frequent sinus infections In left ear, headache, teeth pain Did sinus flushes and sudafed Seems to improve then relapse again No fever Does have maxillary pain on the left Has post nasal drip and then coughs PND has worsened Some sore throat  No SOB  Current Outpatient Medications on File Prior to Visit  Medication Sig Dispense Refill  . albuterol (PROVENTIL HFA;VENTOLIN HFA) 108 (90 Base) MCG/ACT inhaler Inhale 2 puffs into the lungs every 4 (four) hours as needed for wheezing. 1 Inhaler 1  . aspirin 81 MG chewable tablet Chew 81 mg by mouth daily.    . Biotin 5000 MCG TABS Take 2 tablets by mouth daily.    . calcium carbonate 1250 MG capsule Take 1,250 mg by mouth 2 (two) times daily with a meal.    . cetirizine (ZYRTEC) 10 MG tablet Take 10 mg by mouth daily.    . diphenhydrAMINE (BENADRYL ALLERGY) 25 mg capsule Take 50 mg by mouth every 6 (six) hours as needed.     . docusate sodium (COLACE) 100 MG capsule Take 200 mg by mouth 2 (two) times daily.    Marland Kitchen doxylamine, Sleep, (UNISOM) 25 MG tablet Take 25 mg by mouth at bedtime as needed.     . Flaxseed, Linseed, (FLAX SEED OIL) 1300 MG CAPS     . fluticasone (FLONASE) 50 MCG/ACT nasal spray SPRAY 2 SPRAYS IN EACH NOSTRIL NIGHTLY  11  . Magnesium 400 MG TABS Take 1 tablet by mouth 2 (two) times daily.    . metoprolol tartrate (LOPRESSOR) 25 MG tablet TAKE 1/2-1 TABLET BY MOUTH TWICE DAILY AS NEEDED FOR TACHYCARDIA *INS MAX 1/DAY* 180 tablet 1  . Multiple Vitamin (MULTIVITAMIN) tablet Take 1 tablet by mouth daily.    . pseudoephedrine (SUDAFED) 30 MG tablet      No current facility-administered medications on file prior to visit.     Allergies   Allergen Reactions  . Penicillins Anaphylaxis    Past Medical History:  Diagnosis Date  . Anxiety state, unspecified 02/23/2013  . GERD (gastroesophageal reflux disease) 02/23/2013  . Histoplasmosis 2006  . Hot flashes, menopausal   . HPV test positive   . Insomnia   . PAC (premature atrial contraction)   . PVC (premature ventricular contraction)   . Seasonal allergies 02/23/2013    Past Surgical History:  Procedure Laterality Date  . COLPOSCOPY  2010   Negative  . DILATION AND CURETTAGE OF UTERUS     missed ab  . NOSE SURGERY      Family History  Problem Relation Age of Onset  . Breast cancer Mother 12    Social History   Socioeconomic History  . Marital status: Married    Spouse name: Not on file  . Number of children: Not on file  . Years of education: Not on file  . Highest education level: Not on file  Occupational History  . Not on file  Social Needs  . Financial resource strain: Not on file  . Food insecurity    Worry: Not on file    Inability: Not on file  . Transportation needs    Medical: Not on file  Non-medical: Not on file  Tobacco Use  . Smoking status: Former Smoker    Types: Cigarettes    Quit date: 02/20/1990    Years since quitting: 29.1  . Smokeless tobacco: Never Used  Substance and Sexual Activity  . Alcohol use: Yes    Alcohol/week: 0.0 standard drinks    Comment: rarely  . Drug use: No  . Sexual activity: Yes    Partners: Male    Birth control/protection: Surgical    Comment: tubal ligation  Lifestyle  . Physical activity    Days per week: Not on file    Minutes per session: Not on file  . Stress: Not on file  Relationships  . Social Herbalist on phone: Not on file    Gets together: Not on file    Attends religious service: Not on file    Active member of club or organization: Not on file    Attends meetings of clubs or organizations: Not on file    Relationship status: Not on file  . Intimate partner violence     Fear of current or ex partner: Not on file    Emotionally abused: Not on file    Physically abused: Not on file    Forced sexual activity: Not on file  Other Topics Concern  . Not on file  Social History Narrative  . Not on file   Review of Systems Smell in more sensitive ---no change in taste Retired private duty nurse    Objective:   Physical Exam  Constitutional: No distress.  HENT:  Maxillary tenderness when she presses  Respiratory: Effort normal. No respiratory distress.  Psychiatric: She has a normal mood and affect.           Assessment & Plan:

## 2019-04-18 ENCOUNTER — Other Ambulatory Visit: Payer: Self-pay | Admitting: *Deleted

## 2019-04-18 DIAGNOSIS — R6889 Other general symptoms and signs: Secondary | ICD-10-CM | POA: Diagnosis not present

## 2019-04-18 DIAGNOSIS — Z20822 Contact with and (suspected) exposure to covid-19: Secondary | ICD-10-CM

## 2019-04-19 LAB — NOVEL CORONAVIRUS, NAA: SARS-CoV-2, NAA: NOT DETECTED

## 2019-05-01 ENCOUNTER — Ambulatory Visit (INDEPENDENT_AMBULATORY_CARE_PROVIDER_SITE_OTHER)
Admission: RE | Admit: 2019-05-01 | Discharge: 2019-05-01 | Disposition: A | Discharge status: Home / Self Care | Payer: BC Managed Care – PPO | Source: Ambulatory Visit | Attending: Family Medicine | Admitting: Family Medicine

## 2019-05-01 ENCOUNTER — Encounter: Payer: Self-pay | Admitting: Family Medicine

## 2019-05-01 ENCOUNTER — Ambulatory Visit (INDEPENDENT_AMBULATORY_CARE_PROVIDER_SITE_OTHER): Payer: BC Managed Care – PPO | Admitting: Family Medicine

## 2019-05-01 ENCOUNTER — Other Ambulatory Visit: Payer: Self-pay

## 2019-05-01 VITALS — BP 110/66 | HR 81 | Temp 98.3°F | Ht 64.0 in | Wt 134.2 lb

## 2019-05-01 DIAGNOSIS — J8410 Pulmonary fibrosis, unspecified: Secondary | ICD-10-CM | POA: Diagnosis not present

## 2019-05-01 DIAGNOSIS — G2589 Other specified extrapyramidal and movement disorders: Secondary | ICD-10-CM | POA: Diagnosis not present

## 2019-05-01 DIAGNOSIS — M898X1 Other specified disorders of bone, shoulder: Secondary | ICD-10-CM

## 2019-05-01 NOTE — Progress Notes (Signed)
Katie Selover T. [name], MD Primary Care and [facility] at [facility] [place], [address] Phone: [phone]  FAX: [phone]  Katie Todd - 58 y.o. female  MRN [id number]  Date of Birth: [birth year]  Visit Date: 05/01/2019  PCP: Owens Loffler, MD  Referred by: Owens Loffler, MD  Chief Complaint  Patient presents with  . Scapula Pain    Left   Subjective:   Katie Todd is a 58 y.o. very pleasant female patient with Body mass index is 23.04 kg/m. who presents with the following:  L scapular pain.   Had shingles eight years ago.    No neck pain.  Not normally.   Trap and rhomboid poor str  She is a very nice lady who I know quite well.  She presents with left-sided scapular pain.  She is not having any numbness or radiculopathy, and also she denies neck pain.  She is not having any pain with movement of the humerus at all.  This isolates out to the periscapular region.  No T-shirt distribution of pain and no pain with abduction and internal range of motion.  Past Medical History, Surgical History, Social History, Family History, Problem List, Medications, and Allergies have been reviewed and updated if relevant.  Patient Active Problem List   Diagnosis Date Noted  . Sore throat 10/11/2018  . Family history of abdominal aortic aneurysm (AAA) x 2 d/c, [place] 08/08/2018  . Acute maxillary sinusitis 09/10/2014  . Nodule of flexor tendon sheath 02/26/2013  . Seasonal allergies 02/23/2013  . GERD (gastroesophageal reflux disease) 02/23/2013  . Anxiety state, unspecified 02/23/2013    Past Medical History:  Diagnosis Date  . Anxiety state, unspecified 02/23/2013  . GERD (gastroesophageal reflux disease) 02/23/2013  . Histoplasmosis 2006  . Hot flashes, menopausal   . HPV test positive   . Insomnia   . PAC (premature atrial contraction)   . PVC (premature ventricular contraction)   . Seasonal allergies  02/23/2013    Past Surgical History:  Procedure Laterality Date  . COLPOSCOPY  2010   Negative  . DILATION AND CURETTAGE OF UTERUS     missed ab  . NOSE SURGERY      Social History   Socioeconomic History  . Marital status: Married    Spouse name: Not on file  . Number of children: Not on file  . Years of education: Not on file  . Highest education level: Not on file  Occupational History  . Not on file  Social Needs  . Financial resource strain: Not on file  . Food insecurity    Worry: Not on file    Inability: Not on file  . Transportation needs    Medical: Not on file    Non-medical: Not on file  Tobacco Use  . Smoking status: Former Smoker    Types: Cigarettes    Quit date: 02/20/1990    Years since quitting: 29.2  . Smokeless tobacco: Never Used  Substance and Sexual Activity  . Alcohol use: Yes    Alcohol/week: 0.0 standard drinks    Comment: rarely  . Drug use: No  . Sexual activity: Yes    Partners: Male    Birth control/protection: Surgical    Comment: tubal ligation  Lifestyle  . Physical activity    Days per week: Not on file    Minutes per session: Not on file  . Stress: Not on file  Relationships  .  Social Herbalist on phone: Not on file    Gets together: Not on file    Attends religious service: Not on file    Active member of club or organization: Not on file    Attends meetings of clubs or organizations: Not on file    Relationship status: Not on file  . Intimate partner violence    Fear of current or ex partner: Not on file    Emotionally abused: Not on file    Physically abused: Not on file    Forced sexual activity: Not on file  Other Topics Concern  . Not on file  Social History Narrative  . Not on file    Family History  Problem Relation Age of Onset  . Breast cancer Mother 43    Allergies  Allergen Reactions  . Penicillins Anaphylaxis    Medication list reviewed and updated in full in [facility].   GEN: No fevers, chills. Nontoxic. Primarily MSK c/o today. MSK: Detailed in the HPI GI: tolerating PO intake without difficulty Neuro: No numbness, parasthesias, or tingling associated. Otherwise the pertinent positives of the ROS are noted above.   Objective:   BP 110/66   Pulse 81   Temp 98.3 F (36.8 C) (Temporal)   Ht 5\' 4"  (1.626 m)   Wt 134 lb 4 oz (60.9 kg)   LMP 01/24/2014 Comment: october 2014 was the one prior to this July  SpO2 99%   BMI 23.04 kg/m    GEN: WDWN, NAD, Non-toxic, Alert & Oriented x 3 HEENT: Atraumatic, Normocephalic.  Ears and Nose: No external deformity. EXTR: No clubbing/cyanosis/edema NEURO: Normal gait.  PSYCH: Normally interactive. Conversant. Not depressed or anxious appearing.  Calm demeanor.   Shoulder: L Inspection: No muscle wasting or winging Ecchymosis/edema: neg  AC joint, scapula, clavicle: NT Cervical spine: NT, full ROM Spurling's: neg Abduction: full, 5/5 Flexion: full, 5/5 IR, full, lift-off: 5/5 ER at neutral: full, 5/5 AC crossover and compression: neg Neer: neg Hawkins: neg Drop Test: neg Empty Can: neg Supraspinatus insertion: NT Bicipital groove: NT Speed's: neg Yergason's: neg Sulcus sign: neg Scapular dyskinesis: yes C5-T1 intact Sensation intact Grip 5/5   On shoulder isolation, at the scapular stabilizers.  The patient has quite weak rhomboid as well as lower trap strength.  3+/5 on both sides.  Radiology: Dg Chest 2 View  Result Date: 05/01/2019 CLINICAL DATA:  Shoulder pain EXAM: CHEST - 2 VIEW COMPARISON:  None. FINDINGS: Cardiac shadows within normal limits. The lungs are clear bilaterally. Scattered calcified granulomas are noted. No effusion or infiltrate is seen. No bony abnormality is noted. IMPRESSION: Prior granulomatous disease.  No acute abnormality noted. Electronically Signed   By: Inez Catalina M.D.   On: 05/01/2019 21:16     Assessment and Plan:     ICD-10-CM   1. Scapular dyskinesis   G25.89   2. Shoulder blade pain  M89.8X1 DG Chest 2 View   Pretty classic scapular dyskinesis.  I reviewed home rehab program with her.  This is not radicular pain from the neck, and is also not involving the glenohumeral joint.  She had a question regarding could this potentially be a lung neoplasm, not got a chest x-ray.  Follow-up: No follow-ups on file.  No orders of the defined types were placed in this encounter.  Orders Placed This Encounter  Procedures  . DG Chest 2 View    Signed,  Frederico Hamman T. Gayle Collard, MD   Outpatient  Encounter Medications as of 05/01/2019  Medication Sig  . albuterol (PROVENTIL HFA;VENTOLIN HFA) 108 (90 Base) MCG/ACT inhaler Inhale 2 puffs into the lungs every 4 (four) hours as needed for wheezing.  Marland Kitchen aspirin 81 MG chewable tablet Chew 81 mg by mouth daily.  . Biotin 5000 MCG TABS Take 2 tablets by mouth daily.  . calcium carbonate 1250 MG capsule Take 1,250 mg by mouth 2 (two) times daily with a meal.  . cetirizine (ZYRTEC) 10 MG tablet Take 10 mg by mouth daily.  . diphenhydrAMINE (BENADRYL ALLERGY) 25 mg capsule Take 50 mg by mouth every 6 (six) hours as needed.   . docusate sodium (COLACE) 100 MG capsule Take 200 mg by mouth 2 (two) times daily.  Marland Kitchen doxycycline (VIBRA-TABS) 100 MG tablet Take 1 tablet (100 mg total) by mouth 2 (two) times daily.  Marland Kitchen doxylamine, Sleep, (UNISOM) 25 MG tablet Take 25 mg by mouth at bedtime as needed.   . Flaxseed, Linseed, (FLAX SEED OIL) 1300 MG CAPS   . fluticasone (FLONASE) 50 MCG/ACT nasal spray SPRAY 2 SPRAYS IN EACH NOSTRIL NIGHTLY  . Magnesium 400 MG TABS Take 1 tablet by mouth 2 (two) times daily.  . Multiple Vitamin (MULTIVITAMIN) tablet Take 1 tablet by mouth daily.  . pseudoephedrine (SUDAFED) 30 MG tablet   . [DISCONTINUED] metoprolol tartrate (LOPRESSOR) 25 MG tablet TAKE 1/2-1 TABLET BY MOUTH TWICE DAILY AS NEEDED FOR TACHYCARDIA *INS MAX 1/DAY*   No facility-administered encounter medications on file as of  05/01/2019.

## 2019-05-02 ENCOUNTER — Encounter: Payer: Self-pay | Admitting: Family Medicine

## 2019-05-03 ENCOUNTER — Encounter: Payer: Self-pay | Admitting: Family Medicine

## 2019-08-16 DIAGNOSIS — Z20828 Contact with and (suspected) exposure to other viral communicable diseases: Secondary | ICD-10-CM | POA: Diagnosis not present

## 2019-08-19 ENCOUNTER — Telehealth: Payer: BC Managed Care – PPO | Admitting: Nurse Practitioner

## 2019-08-19 DIAGNOSIS — J0100 Acute maxillary sinusitis, unspecified: Secondary | ICD-10-CM

## 2019-08-19 DIAGNOSIS — R5381 Other malaise: Secondary | ICD-10-CM | POA: Diagnosis not present

## 2019-08-19 DIAGNOSIS — R05 Cough: Secondary | ICD-10-CM

## 2019-08-19 DIAGNOSIS — R519 Headache, unspecified: Secondary | ICD-10-CM | POA: Diagnosis not present

## 2019-08-19 DIAGNOSIS — R5081 Fever presenting with conditions classified elsewhere: Secondary | ICD-10-CM

## 2019-08-19 DIAGNOSIS — R059 Cough, unspecified: Secondary | ICD-10-CM

## 2019-08-19 MED ORDER — DOXYCYCLINE HYCLATE 100 MG PO TABS: 100.0000 mg | ORAL_TABLET | Freq: Two times a day (BID) | ORAL | 0 refills | Status: DC

## 2019-08-19 MED ORDER — BENZONATATE 100 MG PO CAPS: 100.0000 mg | ORAL_CAPSULE | Freq: Three times a day (TID) | ORAL | 0 refills | Status: DC | PRN

## 2019-08-19 NOTE — Addendum Note (Signed)
Addended by: Chevis Pretty on: 08/19/2019 01:17 PM   Modules accepted: Orders

## 2019-08-19 NOTE — Progress Notes (Signed)
E-Visit for Corona Virus Screening  Your current symptoms could be consistent with the coronavirus. You need to be tested for covid prior to [place] doing any other treatment.  Many health care providers can now test patients at their office but not all are.  [place] has multiple testing sites. For information on our [place] testing locations and hours go to [email or web address]  We are enrolling you in our [place] for [place] . Daily you will receive a questionnaire within the [facility] website. Our COVID 19 response team will be monitoring your responses daily.  Testing Information: The COVID-19 Community Testing sites will begin testing BY APPOINTMENT ONLY.  You can schedule online at [email or web address]  If you do not have access to a smart phone or computer you may call [phone] for an appointment.   Additional testing sites in the Community:  . For [facility] Testing sites in [facility]  FaceUpdate.[email or web address]  . For Pop-up testing sites in [place]  BowlDirectory.co.uk  . For Testing sites with regular hours [email or web address]  . For [place] [email or web address]  . For [facility] [email or web address]  . For [facility] testing in [place] and [employer] [email or web address]  . For [facility] testing in [facility]   [email or web address]  For  more information about community testing call [phone]   Please quarantine yourself while awaiting your test results. Please stay home for a minimum of 10 days from the first day of illness with improving symptoms and you have  had 24 hours of no fever (without the use of Tylenol (Acetaminophen) Motrin (Ibuprofen) or any fever reducing medication).  Also - Do not get tested prior to returning to work because once you have had a positive test the test can stay positive for more then a month in some cases.   You should wear a mask or cloth face covering over your nose and mouth if you must be around other people or animals, including pets (even at home). Try to stay at least 6 feet away from other people. This will protect the people around you.  Please continue good preventive care measures, including:  frequent hand-washing, avoid touching your face, cover coughs/sneezes, stay out of crowds and keep a 6 foot distance from others.  COVID-19 is a respiratory illness with symptoms that are similar to the flu. Symptoms are typically mild to moderate, but there have been cases of severe illness and death due to the virus.   The following symptoms may appear 2-14 days after exposure: . Fever . Cough . Shortness of breath or difficulty breathing . Chills . Repeated shaking with chills . Muscle pain . Headache . Sore throat . New loss of taste or smell . Fatigue . Congestion or runny nose . Nausea or vomiting . Diarrhea  Go to the nearest hospital ED for assessment if fever/cough/breathlessness are severe or illness seems like a threat to life.  It is vitally important that if you feel that you have an infection such as this virus or any other virus that you stay home and away from places where you may spread it to others.  You should avoid contact with people age 64 and older.   You can use medication such as A prescription cough medication called Tessalon Perles 100 mg. You may take 1-2 capsules every 8 hours as needed for cough  You may also take acetaminophen (Tylenol) as needed for fever.  Reduce your risk of any infection by using the same  precautions used for avoiding the common cold or flu:  Marland Kitchen Wash your hands  often with soap and warm water for at least 20 seconds.  If soap and water are not readily available, use an alcohol-based hand sanitizer with at least 60% alcohol.  . If coughing or sneezing, cover your mouth and nose by coughing or sneezing into the elbow areas of your shirt or coat, into a tissue or into your sleeve (not your hands). . Avoid shaking hands with others and consider head nods or verbal greetings only. . Avoid touching your eyes, nose, or mouth with unwashed hands.  . Avoid close contact with people who are sick. . Avoid places or events with large numbers of people in one location, like concerts or sporting events. . Carefully consider travel plans you have or are making. . If you are planning any travel outside or inside the [place], visit the CDC's Travelers' Health webpage for the latest health notices. . If you have some symptoms but not all symptoms, continue to monitor at home and seek medical attention if your symptoms worsen. . If you are having a medical emergency, call 911.  HOME CARE . Only take medications as instructed by your medical team. . Drink plenty of fluids and get plenty of rest. . A steam or ultrasonic humidifier can help if you have congestion.   GET HELP RIGHT AWAY IF YOU HAVE EMERGENCY WARNING SIGNS** FOR COVID-19. If you or someone is showing any of these signs seek emergency medical care immediately. Call 911 or proceed to your closest emergency facility if: . You develop worsening high fever. . Trouble breathing . Bluish lips or face . Persistent pain or pressure in the chest . New confusion . Inability to wake or stay awake . You cough up blood. . Your symptoms become more severe  **This list is not all possible symptoms. Contact your medical provider for any symptoms that are sever or concerning to you.  MAKE SURE YOU   Understand these instructions.  Will watch your condition.  Will get help right away if you are not doing well or get  worse.  Your e-visit answers were reviewed by a board certified advanced clinical practitioner to complete your personal care plan.  Depending on the condition, your plan could have included both over the counter or prescription medications.  If there is a problem please reply once you have received a response from your provider.  Your safety is important to [place].  If you have drug allergies check your prescription carefully.    You can use MyChart to ask questions about today's visit, request a non-urgent call back, or ask for a work or school excuse for 24 hours related to this e-Visit. If it has been greater than 24 hours you will need to follow up with your provider, or enter a new e-Visit to address those concerns. You will get an e-mail in the next two days asking about your experience.  I hope that your e-visit has been valuable and will speed your recovery. Thank you for using e-visits.   5-10 minutes spent reviewing and documenting in chart.

## 2019-08-19 NOTE — Progress Notes (Signed)
covid test results not in chart from latest test. Patient said they were negative. We are sorry that you are not feeling well.  Here is how we plan to help!  Based on what you have shared with me it looks like you have sinusitis.  Sinusitis is inflammation and infection in the sinus cavities of the head.  Based on your presentation I believe you most likely have Acute Bacterial Sinusitis.  This is an infection caused by bacteria and is treated with antibiotics. I have prescribed Doxycycline 100mg  by mouth twice a day for 10 days. You may use an oral decongestant such as Mucinex D or if you have glaucoma or high blood pressure use plain Mucinex. Saline nasal spray help and can safely be used as often as needed for congestion.  If you develop worsening sinus pain, fever or notice severe headache and vision changes, or if symptoms are not better after completion of antibiotic, please schedule an appointment with a health care provider.    Sinus infections are not as easily transmitted as other respiratory infection, however we still recommend that you avoid close contact with loved ones, especially the very young and elderly.  Remember to wash your hands thoroughly throughout the day as this is the number one way to prevent the spread of infection!  Home Care:  Only take medications as instructed by your medical team.  Complete the entire course of an antibiotic.  Do not take these medications with alcohol.  A steam or ultrasonic humidifier can help congestion.  You can place a towel over your head and breathe in the steam from hot water coming from a faucet.  Avoid close contacts especially the very young and the elderly.  Cover your mouth when you cough or sneeze.  Always remember to wash your hands.  Get Help Right Away If:  You develop worsening fever or sinus pain.  You develop a severe head ache or visual changes.  Your symptoms persist after you have completed your treatment  plan.  Make sure you  Understand these instructions.  Will watch your condition.  Will get help right away if you are not doing well or get worse.  Your e-visit answers were reviewed by a board certified advanced clinical practitioner to complete your personal care plan.  Depending on the condition, your plan could have included both over the counter or prescription medications.  If there is a problem please reply  once you have received a response from your provider.  Your safety is important to [place].  If you have drug allergies check your prescription carefully.    You can use MyChart to ask questions about today's visit, request a non-urgent call back, or ask for a work or school excuse for 24 hours related to this e-Visit. If it has been greater than 24 hours you will need to follow up with your provider, or enter a new e-Visit to address those concerns.  You will get an e-mail in the next two days asking about your experience.  I hope that your e-visit has been valuable and will speed your recovery. Thank you for using e-visits.   5-10 minutes spent reviewing and documenting in chart.

## 2019-11-14 ENCOUNTER — Encounter: Payer: Self-pay | Admitting: Family Medicine

## 2019-11-14 ENCOUNTER — Ambulatory Visit (INDEPENDENT_AMBULATORY_CARE_PROVIDER_SITE_OTHER): Payer: BLUE CROSS/BLUE SHIELD | Admitting: Family Medicine

## 2019-11-14 ENCOUNTER — Other Ambulatory Visit: Payer: Self-pay

## 2019-11-14 VITALS — BP 104/80 | HR 86 | Temp 98.4°F | Ht 64.0 in | Wt 125.8 lb

## 2019-11-14 DIAGNOSIS — R0989 Other specified symptoms and signs involving the circulatory and respiratory systems: Secondary | ICD-10-CM | POA: Diagnosis not present

## 2019-11-14 DIAGNOSIS — R209 Unspecified disturbances of skin sensation: Secondary | ICD-10-CM

## 2019-11-14 DIAGNOSIS — I739 Peripheral vascular disease, unspecified: Secondary | ICD-10-CM | POA: Diagnosis not present

## 2019-11-14 DIAGNOSIS — R053 Chronic cough: Secondary | ICD-10-CM

## 2019-11-14 DIAGNOSIS — R05 Cough: Secondary | ICD-10-CM | POA: Diagnosis not present

## 2019-11-14 MED ORDER — ALBUTEROL SULFATE HFA 108 (90 BASE) MCG/ACT IN AERS: 2.0000 | INHALATION_SPRAY | RESPIRATORY_TRACT | 3 refills | Status: DC | PRN

## 2019-11-14 MED ORDER — MONTELUKAST SODIUM 10 MG PO TABS: 10.0000 mg | ORAL_TABLET | Freq: Every day | ORAL | 3 refills | Status: DC

## 2019-11-14 MED ORDER — FLUOCINONIDE 0.05 % EX OINT: 1.0000 "application " | TOPICAL_OINTMENT | Freq: Two times a day (BID) | CUTANEOUS | 1 refills | Status: DC

## 2019-11-14 NOTE — Progress Notes (Signed)
Katie Todd T. Katie Kuba, MD, [place] at [facility] [place], [address]  Phone: [phone]  FAX: [phone]  Katie Todd - 59 y.o. female  MRN [id number]  Date of Birth: Nov 20, 1960  Date: 11/14/2019  PCP: Katie Loffler, MD  Referral: Katie Loffler, MD  Chief Complaint  Patient presents with  . Cough  . Rash    on left foot x 3 months-itchy    This visit occurred during the SARS-CoV-2 public health emergency.  Safety protocols were in place, including screening questions prior to the visit, additional usage of staff PPE, and extensive cleaning of exam room while observing appropriate contact time as indicated for disinfecting solutions.   Subjective:   Katie Todd is a 59 y.o. very pleasant female patient with Body mass index is 21.58 kg/m. who presents with the following:  Katie Todd presents with a number of medical complaints today.  She has had a cough off and on for 10 years.  She has been to both pulmonology as well as ENT, but she does not have a clear picture as to what could be a driving factor.  She did have formal PFTs at the [facility] clinic, and they were normal.  ENT also has felt that this is at least partially contributed to postnasal drip.  She did have some granulomatous changes on prior chest CT.  She has noted that albuterol does help with the coughing and taking a deep breath.  She also has some bluish tent to her left greater than right foot.  There is also some new onset cold.  Foot L rash: On the left hindfoot there is a very small tannish coloration that is not elevated and no evidence of excoriation.    Review of Systems is noted in the HPI, as appropriate  Objective:   BP 104/80   Pulse 86   Temp 98.4 F (36.9 C) (Temporal)   Ht 5\' 4"  (1.626 m)   Wt 125 lb 12 oz (57 kg)   LMP 01/24/2014 Comment: october 2014 was the one prior to this July   SpO2 97%   BMI 21.58 kg/m   GEN: No acute distress; alert,appropriate. PULM: Breathing comfortably in no respiratory distress PSYCH: Normally interactive.   DP pulses appear to be slightly decreased than expected.  PT pulses appear to be normal.  CV: RRR, no m/g/r PULM: Normal respiratory rate, no accessory muscle use. No wheezes, crackles or rhonchi  Extremities: Bilateral feet worse distally the patient does have cold to cold extremities compared to her remainder of her foot and ankle as well as lower extremity.  Laboratory and Imaging Data:  Assessment and Plan:     ICD-10-CM   1. Decreased pulses in feet  R09.89 VAS [place] LE ART SEG MULTI (Segm&LE Reynauds)  2. Cold feet  R20.9 VAS [place] LE ART SEG MULTI (Segm&LE Reynauds)  3. Chronic cough  R05 VAS [place] LE ART SEG MULTI (Segm&LE Reynauds)  4. Intermittent claudication (HCC)  I73.9 VAS [place] LE ART SEG MULTI (Segm&LE Reynauds)   Concern with DP pulses decreased and new onset purple/blue discoloration with cold.  Obtain ABIs to evaluate for potential arterial dysfunction.  Chronic cough, not clear secondary to prior evaluations but clinically improved with albuterol.  Very mild or cough variant asthma versus exercise-induced asthma is entirely possible.  She also has allergies, so I am can place her on some Singulair daily and see  if this helps.  Lidex may help with the patient's rash, and it is certainly worth a try.  Follow-up: No follow-ups on file.  Meds ordered this encounter  Medications  . fluocinonide ointment (LIDEX) 0.05 %    Sig: Apply 1 application topically 2 (two) times daily. Apply to foot    Dispense:  60 g    Refill:  1  . montelukast (SINGULAIR) 10 MG tablet    Sig: Take 1 tablet (10 mg total) by mouth at bedtime.    Dispense:  30 tablet    Refill:  3  . albuterol (VENTOLIN HFA) 108 (90 Base) MCG/ACT inhaler    Sig: Inhale 2 puffs into the lungs every 4 (four) hours as needed for wheezing.    Dispense:  18 g     Refill:  3   Medications Discontinued During This Encounter  Medication Reason  . doxycycline (VIBRA-TABS) 100 MG tablet Completed Course  . albuterol (PROVENTIL HFA;VENTOLIN HFA) 108 (90 Base) MCG/ACT inhaler Reorder   Orders Placed This Encounter  Procedures  . VAS [place] LE ART SEG MULTI (Segm&LE Reynauds)    Signed,  Katie Neidhardt T. Randee Huston, MD   Outpatient Encounter Medications as of 11/14/2019  Medication Sig  . albuterol (VENTOLIN HFA) 108 (90 Base) MCG/ACT inhaler Inhale 2 puffs into the lungs every 4 (four) hours as needed for wheezing.  Marland Kitchen aspirin 81 MG chewable tablet Chew 81 mg by mouth daily.  . benzonatate (TESSALON PERLES) 100 MG capsule Take 1 capsule (100 mg total) by mouth 3 (three) times daily as needed.  . Biotin 5000 MCG TABS Take 2 tablets by mouth daily.  . calcium carbonate 1250 MG capsule Take 1,250 mg by mouth 2 (two) times daily with a meal.  . cetirizine (ZYRTEC) 10 MG tablet Take 10 mg by mouth daily.  . diphenhydrAMINE (BENADRYL ALLERGY) 25 mg capsule Take 50 mg by mouth every 6 (six) hours as needed.   . docusate sodium (COLACE) 100 MG capsule Take 200 mg by mouth 2 (two) times daily.  Marland Kitchen doxylamine, Sleep, (UNISOM) 25 MG tablet Take 25 mg by mouth at bedtime as needed.   . Flaxseed, Linseed, (FLAX SEED OIL) 1300 MG CAPS   . fluticasone (FLONASE) 50 MCG/ACT nasal spray SPRAY 2 SPRAYS IN EACH NOSTRIL NIGHTLY  . Magnesium 400 MG TABS Take 1 tablet by mouth 2 (two) times daily.  . Multiple Vitamin (MULTIVITAMIN) tablet Take 1 tablet by mouth daily.  . pseudoephedrine (SUDAFED) 30 MG tablet   . [DISCONTINUED] albuterol (PROVENTIL HFA;VENTOLIN HFA) 108 (90 Base) MCG/ACT inhaler Inhale 2 puffs into the lungs every 4 (four) hours as needed for wheezing.  . fluocinonide ointment (LIDEX) [id number] % Apply 1 application topically 2 (two) times daily. Apply to foot  . montelukast (SINGULAIR) 10 MG tablet Take 1 tablet (10 mg total) by mouth at bedtime.  . [DISCONTINUED]  doxycycline (VIBRA-TABS) 100 MG tablet Take 1 tablet (100 mg total) by mouth 2 (two) times daily. 1 po bid   No facility-administered encounter medications on file as of 11/14/2019.

## 2019-11-15 ENCOUNTER — Ambulatory Visit (HOSPITAL_COMMUNITY)
Admission: RE | Admit: 2019-11-15 | Discharge: 2019-11-15 | Disposition: A | Discharge status: Home / Self Care | Payer: BLUE CROSS/BLUE SHIELD | Source: Ambulatory Visit | Attending: Cardiovascular Disease | Admitting: Cardiovascular Disease

## 2019-11-15 DIAGNOSIS — R209 Unspecified disturbances of skin sensation: Secondary | ICD-10-CM

## 2019-11-15 DIAGNOSIS — R05 Cough: Secondary | ICD-10-CM | POA: Insufficient documentation

## 2019-11-15 DIAGNOSIS — I739 Peripheral vascular disease, unspecified: Secondary | ICD-10-CM | POA: Diagnosis not present

## 2019-11-15 DIAGNOSIS — R0989 Other specified symptoms and signs involving the circulatory and respiratory systems: Secondary | ICD-10-CM

## 2019-11-15 DIAGNOSIS — R053 Chronic cough: Secondary | ICD-10-CM

## 2019-11-16 ENCOUNTER — Encounter: Payer: Self-pay | Admitting: Family Medicine

## 2019-12-11 ENCOUNTER — Encounter: Payer: Self-pay | Admitting: Family Medicine

## 2019-12-11 ENCOUNTER — Ambulatory Visit (INDEPENDENT_AMBULATORY_CARE_PROVIDER_SITE_OTHER)
Admission: RE | Admit: 2019-12-11 | Discharge: 2019-12-11 | Disposition: A | Discharge status: Home / Self Care | Payer: BLUE CROSS/BLUE SHIELD | Source: Ambulatory Visit | Attending: Family Medicine | Admitting: Family Medicine

## 2019-12-11 ENCOUNTER — Other Ambulatory Visit: Payer: Self-pay

## 2019-12-11 ENCOUNTER — Ambulatory Visit (INDEPENDENT_AMBULATORY_CARE_PROVIDER_SITE_OTHER): Payer: BLUE CROSS/BLUE SHIELD | Admitting: Family Medicine

## 2019-12-11 VITALS — BP 100/70 | HR 82 | Temp 98.0°F | Ht 64.0 in | Wt 129.5 lb

## 2019-12-11 DIAGNOSIS — R0602 Shortness of breath: Secondary | ICD-10-CM | POA: Diagnosis not present

## 2019-12-11 DIAGNOSIS — M549 Dorsalgia, unspecified: Secondary | ICD-10-CM

## 2019-12-11 DIAGNOSIS — Z87891 Personal history of nicotine dependence: Secondary | ICD-10-CM

## 2019-12-11 DIAGNOSIS — R000 Tachycardia, unspecified: Secondary | ICD-10-CM

## 2019-12-11 DIAGNOSIS — M25552 Pain in left hip: Secondary | ICD-10-CM

## 2019-12-11 DIAGNOSIS — Z8249 Family history of ischemic heart disease and other diseases of the circulatory system: Secondary | ICD-10-CM

## 2019-12-11 DIAGNOSIS — R21 Rash and other nonspecific skin eruption: Secondary | ICD-10-CM

## 2019-12-11 DIAGNOSIS — M545 Low back pain: Secondary | ICD-10-CM | POA: Diagnosis not present

## 2019-12-11 MED ORDER — CLOBETASOL PROPIONATE 0.05 % EX OINT: 1.0000 "application " | TOPICAL_OINTMENT | Freq: Two times a day (BID) | CUTANEOUS | 2 refills | Status: DC

## 2019-12-11 MED ORDER — DICLOFENAC SODIUM 75 MG PO TBEC
75.0000 mg | DELAYED_RELEASE_TABLET | Freq: Two times a day (BID) | ORAL | 3 refills | Status: DC
Start: 2019-12-11 — End: 2020-04-03

## 2019-12-11 NOTE — Progress Notes (Signed)
[name] Angel Hobdy, MD, [place] at [facility] [place], [address]  Phone: [phone]  FAX: [phone]  Katie Todd - 59 y.o. female  MRN [id number]  Date of Birth: May 25, 1961  Date: 12/11/2019  PCP: Owens Loffler, MD  Referral: Owens Loffler, MD  Chief Complaint  Patient presents with  . Hip Pain    Left-radiates to groin and leg  . Rash    Left Foot-not resolving    This visit occurred during the SARS-CoV-2 public health emergency.  Safety protocols were in place, including screening questions prior to the visit, additional usage of staff PPE, and extensive cleaning of exam room while observing appropriate contact time as indicated for disinfecting solutions.   Subjective:   Katie Todd is a 59 y.o. very pleasant female patient with Body mass index is 22.23 kg/m. who presents with the following:  She is here with a number of complaints today.  She complains of some left-sided buttocks pain as well as anterior groin pain on the left.  She has pain that goes from her back and buttocks and occasionally goes down her leg.  She also describes some anterior groin pain.  She believes she is having some restriction of motion.  She also has a left lateral foot pruritic area.  I did place her on some Lidex the last time she was in her office and this has improved, but it continues to bother her and is somewhat itchy.  Her husband has told her that off-and-on she does complain about her hip bothering her.  She has tried both Motrin and heating pad which does not help.  Towards the end of visit, the patient did bring up that she was feeling dizzy and had some elevation in her blood pressure, she did get some shortness of breath, particular when she was in and around time when she went to the bathroom, however, it was not limited at this time.  Initially over the last  approximately 10 minutes, and then it would last longer than this upwards of an hour.  She wonders if this could be constipation.  L groin pain, L posterior buttocks pain.  Occ will say that her hip is hurting. Off and on.  Motrin and heating pad - not helping.    No chest pain Shortness of breath Episode last 10 minutes Last 1 hour or more Will suddenly feel like she has to go to the bathroom Shaking Tachycardia Will get busy.  Cardiac risk: Mom had CHF Dad d/c at 11 Smoker for 15 years Normal lipids BP normal  Review of Systems is noted in the HPI, as appropriate  Objective:   BP 100/70   Pulse 82   Temp 98 F (36.7 C) (Temporal)   Ht '5\' 4"'  (1.626 m)   Wt 129 lb 8 oz (58.7 kg)   LMP 01/24/2014 Comment: october 2014 was the one prior to this July  SpO2 97%   BMI 22.23 kg/m    GEN: WDWN, NAD, Non-toxic HEENT: Atraumatic, Normocephalic. Neck supple. No masses. CV: RRR, No M/G/R. No JVD. No thrill. No extra heart sounds. PULM: CTA B, no wheezes, crackles, rhonchi. No retractions. No resp. distress. No accessory muscle use. EXTR: No c/c/e NEURO Normal gait.  PSYCH: Normally interactive. Conversant.   HIP EXAM: SIDE: Left ROM: Abduction, Flexion, Internal and External range of motion: Full Pain with terminal IROM and EROM: None GTB:  NT SLR: NEG Knees: No effusion FABER: NT REVERSE FABER: NT, neg Piriformis: NT at direct palpation Str: flexion: 5/5 abduction: 5/5 adduction: 5/5 Strength testing non-tender  She does have some bilateral tenderness in the paraspinous region mildly from L3-S1.  She also has some pain in the posterior buttocks region.  Modest rash, left foot  Laboratory and Imaging Data: Lab Review:  CBC EXTENDED Latest Ref Rng & Units 08/01/2018 05/11/2017 07/16/2015  WBC 4.0 - 10.5 K/uL 5.6 5.3 6.0  RBC 3.87 - 5.11 Mil/uL 4.72 4.66 4.59  HGB 12.0 - 15.0 g/dL 14.5 14.6 14.3  HCT 36.0 - 46.0 % 43.2 43.2 42.4  PLT 150.0 - 400.0 K/uL 375.0  398.0 403.0(H)  NEUTROABS 1.4 - 7.7 K/uL 2.7 2.3 3.1  LYMPHSABS 0.7 - 4.0 K/uL 2.3 2.4 2.4    BMP Latest Ref Rng & Units 08/01/2018 05/11/2017 07/16/2015  Glucose 70 - 99 mg/dL 74 92 91  BUN 6 - 23 mg/dL '15 15 20  ' Creatinine 0.40 - 1.20 mg/dL 0.72 0.79 0.75  Sodium 135 - 145 mEq/L 141 143 145  Potassium 3.5 - 5.1 mEq/L 3.8 4.2 4.0  Chloride 96 - 112 mEq/L 103 104 105  CO2 19 - 32 mEq/L 31 31 34(H)  Calcium 8.4 - 10.5 mg/dL 10.1 10.1 9.9    Hepatic Function Latest Ref Rng & Units 08/01/2018 05/11/2017 07/16/2015  Total Protein 6.0 - 8.3 g/dL 7.2 6.9 6.7  Albumin 3.5 - 5.2 g/dL 4.5 4.5 4.3  AST 0 - 37 U/L '24 19 22  ' ALT 0 - 35 U/L '19 14 19  ' Alk Phosphatase 39 - 117 U/L 65 70 63  Total Bilirubin 0.2 - 1.2 mg/dL 0.6 0.4 0.3  Bilirubin, Direct 0.0 - 0.3 mg/dL 0.1 0.1 0.0    Lab Results  Component Value Date   CHOL 199 08/01/2018   Lab Results  Component Value Date   HDL 72.80 08/01/2018   Lab Results  Component Value Date   LDLCALC 113 (H) 08/01/2018   Lab Results  Component Value Date   TRIG 65.0 08/01/2018   Lab Results  Component Value Date   CHOLHDL 3 08/01/2018   No results for input(s): PSA in the last 72 hours. No results found for: HCVAB Lab Results  Component Value Date   VD25OH 46.93 05/11/2017     Lab Results  Component Value Date   HGBA1C 5.4 08/01/2018   Lab Results  Component Value Date   LDLCALC 113 (H) 08/01/2018   CREATININE 0.72 08/01/2018    Assessment and Plan:     ICD-10-CM   1. Acute back pain, unspecified back location, unspecified back pain laterality  M54.9 DG Lumbar Spine Complete  2. Acute hip pain, left  M25.552 DG Hip Unilat W OR W/O Pelvis 2-3 Views Left  3. Tachycardia  R00.0 EKG 12-Lead    Ambulatory referral to Cardiology  4. SOB (shortness of breath)  R06.02 EKG 12-Lead    Ambulatory referral to Cardiology  5. Family history of coronary artery disease  Z82.49   6. History of smoking 10-25 pack years  Z87.891   7. Rash  and nonspecific skin eruption  R21    Total encounter time: 40 minutes. On the day of the patient encounter, this can include review of prior records, labs, and imaging.  Additional time can include counselling, consultation with peer MD in person or by telephone.  This also includes independent review of Radiology.  EKG: Normal sinus rhythm. Normal axis, normal R  wave progression, No acute ST elevation or depression.   Plain films are reviewed by myself and she has no evidence of intra-articular hip pathology including no degenerative joint disease.  There is no fracture or dislocation.  Change from Lidex to Temovate.  Rashes reassuring, no concerning features.  Tachycardia occasionally with shortness of breath lasting anywhere from 10 minutes to 60 minutes.  She also has multiple other coronary risk factors include family history, history of smoking.  EKG is reassuring.  She does not have a prior LDL of 113.  Blood pressure is normal.  Worsening over time.  Concerned that this could be worsening coronary disease, and I think that she needs to have a cardiology work-up.  I appreciate the help from my colleagues in cardiology.  Follow-up: Routine medical care only.  Minimal to no concern regarding musculoskeletal pathology.  Meds ordered this encounter  Medications  . clobetasol ointment (TEMOVATE) 0.05 %    Sig: Apply 1 application topically 2 (two) times daily.    Dispense:  60 g    Refill:  2  . diclofenac (VOLTAREN) 75 MG EC tablet    Sig: Take 1 tablet (75 mg total) by mouth 2 (two) times daily.    Dispense:  60 tablet    Refill:  3   There are no discontinued medications. Orders Placed This Encounter  Procedures  . DG Hip Unilat W OR W/O Pelvis 2-3 Views Left  . DG Lumbar Spine Complete  . Ambulatory referral to Cardiology  . EKG 12-Lead    Signed,  Reedy Biernat T. Peony Barner, MD   Outpatient Encounter Medications as of 12/11/2019  Medication Sig  . albuterol (VENTOLIN HFA) 108  (90 Base) MCG/ACT inhaler Inhale 2 puffs into the lungs every 4 (four) hours as needed for wheezing.  Marland Kitchen aspirin 81 MG chewable tablet Chew 81 mg by mouth daily.  . benzonatate (TESSALON PERLES) 100 MG capsule Take 1 capsule (100 mg total) by mouth 3 (three) times daily as needed.  . Biotin 5000 MCG TABS Take 2 tablets by mouth daily.  . calcium carbonate 1250 MG capsule Take 1,250 mg by mouth 2 (two) times daily with a meal.  . cetirizine (ZYRTEC) 10 MG tablet Take 10 mg by mouth daily.  . diphenhydrAMINE (BENADRYL ALLERGY) 25 mg capsule Take 50 mg by mouth every 6 (six) hours as needed.   . docusate sodium (COLACE) 100 MG capsule Take 200 mg by mouth 2 (two) times daily.  Marland Kitchen doxylamine, Sleep, (UNISOM) 25 MG tablet Take 25 mg by mouth at bedtime as needed.   . Flaxseed, Linseed, (FLAX SEED OIL) 1300 MG CAPS   . fluocinonide ointment (LIDEX) 8.78 % Apply 1 application topically 2 (two) times daily. Apply to foot  . fluticasone (FLONASE) 50 MCG/ACT nasal spray SPRAY 2 SPRAYS IN EACH NOSTRIL NIGHTLY  . Magnesium 400 MG TABS Take 1 tablet by mouth 2 (two) times daily.  . montelukast (SINGULAIR) 10 MG tablet Take 1 tablet (10 mg total) by mouth at bedtime.  . Multiple Vitamin (MULTIVITAMIN) tablet Take 1 tablet by mouth daily.  . pseudoephedrine (SUDAFED) 30 MG tablet   . clobetasol ointment (TEMOVATE) 6.76 % Apply 1 application topically 2 (two) times daily.  . diclofenac (VOLTAREN) 75 MG EC tablet Take 1 tablet (75 mg total) by mouth 2 (two) times daily.   No facility-administered encounter medications on file as of 12/11/2019.

## 2019-12-13 ENCOUNTER — Encounter: Payer: Self-pay | Admitting: Family Medicine

## 2019-12-19 ENCOUNTER — Telehealth: Payer: Self-pay | Admitting: Radiology

## 2019-12-19 ENCOUNTER — Encounter: Payer: Self-pay | Admitting: Cardiology

## 2019-12-19 ENCOUNTER — Ambulatory Visit (INDEPENDENT_AMBULATORY_CARE_PROVIDER_SITE_OTHER): Payer: BLUE CROSS/BLUE SHIELD | Admitting: Cardiology

## 2019-12-19 ENCOUNTER — Other Ambulatory Visit: Payer: Self-pay

## 2019-12-19 VITALS — BP 122/80 | HR 78 | Ht 64.0 in | Wt 134.2 lb

## 2019-12-19 DIAGNOSIS — R002 Palpitations: Secondary | ICD-10-CM | POA: Diagnosis not present

## 2019-12-19 DIAGNOSIS — E785 Hyperlipidemia, unspecified: Secondary | ICD-10-CM

## 2019-12-19 DIAGNOSIS — R42 Dizziness and giddiness: Secondary | ICD-10-CM

## 2019-12-19 NOTE — Progress Notes (Signed)
Cardiology Office Note:    Date:  12/20/2019   ID:  Katie Todd, DOB 08-Feb-1961, MRN [id number]  PCP:  Owens Loffler, MD  Cardiologist:  No primary care provider on file.  Electrophysiologist:  None   Referring MD: Owens Loffler, MD   Chief Complaint  Patient presents with  . Dizziness    History of Present Illness:    Katie Todd is a 59 y.o. female with a hx of histoplasmosis who is referred by Dr. Lorelei Pont for evaluation of tachycardia and shortness of breath.  She reports that she has had issues with acute episodes of dizziness, tachycardia, and shortness of breath since 2007-10-19.  Holter monitor at that time showed rare PACs, occasional PVCs.  Reports she has had 4 of these episodes in the last 71-month.  States that episodes are often preceded by abdominal cramps.  Then she will become short of breath and have dizziness and near syncopal episodes.  She denies any chest pain.  States that during episodes her heart beat is irregular.  Checked her BP during last episode was up to 140 over 90s (usually 100s over 60s).  Reports she exercises 6 miles per day on the elliptical.  Occasional dyspnea.  Denies any chest pain, lightheadedness, or syncope with exertion.  She smoked 1 to 2 packs/day x 15 years, quit in 1990/10/19.  Her father died of MI at age 17.  Mother had CHF, unclear cause.  Reports both her mother's sister and brother had aneurysms.  ABIs on 11/15/2019 were normal.    Past Medical History:  Diagnosis Date  . Anxiety state, unspecified 02/23/2013  . GERD (gastroesophageal reflux disease) 02/23/2013  . Histoplasmosis 18-Oct-2004  . Hot flashes, menopausal   . HPV test positive   . Insomnia   . PAC (premature atrial contraction)   . PVC (premature ventricular contraction)   . Seasonal allergies 02/23/2013    Past Surgical History:  Procedure Laterality Date  . COLPOSCOPY  2010   Negative  . DILATION AND CURETTAGE OF UTERUS     missed ab  . NOSE SURGERY      Current  Medications: Current Meds  Medication Sig  . albuterol (VENTOLIN HFA) 108 (90 Base) MCG/ACT inhaler Inhale 2 puffs into the lungs every 4 (four) hours as needed for wheezing.  Marland Kitchen aspirin 81 MG chewable tablet Chew 81 mg by mouth daily.  . benzonatate (TESSALON PERLES) 100 MG capsule Take 1 capsule (100 mg total) by mouth 3 (three) times daily as needed.  . Biotin 5000 MCG TABS Take 2 tablets by mouth daily.  . calcium carbonate 1250 MG capsule Take 1,250 mg by mouth 2 (two) times daily with a meal.  . cetirizine (ZYRTEC) 10 MG tablet Take 10 mg by mouth daily.  . clobetasol ointment (TEMOVATE) [id number] % Apply 1 application topically 2 (two) times daily.  . diclofenac (VOLTAREN) 75 MG EC tablet Take 1 tablet (75 mg total) by mouth 2 (two) times daily.  . diphenhydrAMINE (BENADRYL ALLERGY) 25 mg capsule Take 50 mg by mouth every 6 (six) hours as needed.   . docusate sodium (COLACE) 100 MG capsule Take 200 mg by mouth 2 (two) times daily.  Marland Kitchen doxylamine, Sleep, (UNISOM) 25 MG tablet Take 25 mg by mouth at bedtime as needed.   . Flaxseed, Linseed, (FLAX SEED OIL) 1300 MG CAPS   . fluticasone (FLONASE) 50 MCG/ACT nasal spray SPRAY 2 SPRAYS IN EACH NOSTRIL NIGHTLY  . Magnesium 400 MG TABS Take 1 tablet by  mouth 2 (two) times daily.  . montelukast (SINGULAIR) 10 MG tablet Take 1 tablet (10 mg total) by mouth at bedtime.  . Multiple Vitamin (MULTIVITAMIN) tablet Take 1 tablet by mouth daily.  . pseudoephedrine (SUDAFED) 30 MG tablet      Allergies:   Penicillins   Social History   Socioeconomic History  . Marital status: Married    Spouse name: Not on file  . Number of children: Not on file  . Years of education: Not on file  . Highest education level: Not on file  Occupational History  . Not on file  Tobacco Use  . Smoking status: Former Smoker    Types: Cigarettes    Quit date: 02/20/1990    Years since quitting: 29.8  . Smokeless tobacco: Never Used  Substance and Sexual Activity  .  Alcohol use: Yes    Alcohol/week: 0.0 standard drinks    Comment: rarely  . Drug use: No  . Sexual activity: Yes    Partners: Male    Birth control/protection: Surgical    Comment: tubal ligation  Other Topics Concern  . Not on file  Social History Narrative  . Not on file   Social Determinants of Health   Financial Resource Strain:   . Difficulty of Paying Living Expenses:   Food Insecurity:   . Worried About Charity fundraiser in the Last Year:   . Arboriculturist in the Last Year:   Transportation Needs:   . Film/video editor (Medical):   Marland Kitchen Lack of Transportation (Non-Medical):   Physical Activity:   . Days of Exercise per Week:   . Minutes of Exercise per Session:   Stress:   . Feeling of Stress :   Social Connections:   . Frequency of Communication with Friends and Family:   . Frequency of Social Gatherings with Friends and Family:   . Attends Religious Services:   . Active Member of Clubs or Organizations:   . Attends Archivist Meetings:   Marland Kitchen Marital Status:      Family History: The patient's family history includes Breast cancer (age of onset: 46) in her mother.  ROS:   Please see the history of present illness.    All other systems reviewed and are negative.  EKGs/Labs/Other Studies Reviewed:    The following studies were reviewed today:   EKG:  EKG is ordered today.  The ekg ordered today demonstrates normal sinus rhythm, rate 78, no ST/T abnormality  Recent Labs: No results found for requested labs within last 8760 hours.  Recent Lipid Panel    Component Value Date/Time   CHOL 199 08/01/2018 0749   TRIG 65.0 08/01/2018 0749   HDL 72.80 08/01/2018 0749   CHOLHDL 3 08/01/2018 0749   VLDL 13.0 08/01/2018 0749   LDLCALC 113 (H) 08/01/2018 0749    Physical Exam:    VS:  BP 122/80   Pulse 78   Ht 5\' 4"  (1.626 m)   Wt 134 lb 3.2 oz (60.9 kg)   LMP 01/24/2014 Comment: october 2014 was the one prior to this July  SpO2 100%   BMI  23.04 kg/m     Wt Readings from Last 3 Encounters:  12/19/19 134 lb 3.2 oz (60.9 kg)  12/11/19 129 lb 8 oz (58.7 kg)  11/14/19 125 lb 12 oz (57 kg)     GEN: in no acute distress HEENT: Normal NECK: No JVD; No carotid bruits LYMPHATICS: No lymphadenopathy CARDIAC: RRR,  no murmurs, rubs, gallops RESPIRATORY:  Clear to auscultation without rales, wheezing or rhonchi  ABDOMEN: Soft, non-tender, non-distended MUSCULOSKELETAL:  No edema; No deformity  SKIN: Warm and dry NEUROLOGIC:  Alert and oriented x 3 PSYCHIATRIC:  Normal affect   ASSESSMENT:    1. Palpitations   2. Hyperlipidemia, unspecified hyperlipidemia type   3. Lightheadedness    PLAN:    Palpitations/lightheadedness/near syncope: will evaluate for arrhythmia with Zio patch x2 weeks.  Will check TTE to rule out structural heart disease  Hyperlipidemia: Last LDL 113 on 08/01/2018.  10-year ASCVD risk score is 2%.  However given family history, will check calcium score for further risk stratification   RTC in 3 months  Medication Adjustments/Labs and Tests Ordered: Current medicines are reviewed at length with the patient today.  Concerns regarding medicines are outlined above.  Orders Placed This Encounter  Procedures  . CT CARDIAC SCORING  . LONG TERM MONITOR (3-14 DAYS)  . EKG 12-Lead  . ECHOCARDIOGRAM COMPLETE   No orders of the defined types were placed in this encounter.   Patient Instructions  Medication Instructions:  Your physician recommends that you continue on your current medications as directed. Please refer to the Current Medication list given to you today.  *If you need a refill on your cardiac medications before your next appointment, please call your pharmacy*  Testing/Procedures: Your physician has requested that you have an echocardiogram. Echocardiography is a painless test that uses sound waves to create images of your heart. It provides your doctor with information about the size and  shape of your heart and how well your heart's chambers and valves are working. This procedure takes approximately one hour. There are no restrictions for this procedure. This will be done at our [facility] location:  [place] has requested you wear your ZIO patch monitor 14 days.   This is a single patch monitor.  Irhythm supplies one patch monitor per enrollment.  Additional stickers are not available.   Please do not apply patch if you will be having a Nuclear Stress Test, Echocardiogram, Cardiac CT, MRI, or Chest Xray during the time frame you would be wearing the monitor. The patch cannot be worn during these tests.  You cannot remove and re-apply the ZIO XT patch monitor.   Your ZIO patch monitor will be sent USPS Priority mail from [facility] directly to your home address. The monitor may also be mailed to a PO BOX if home delivery is not available.   It may take 3-5 days to receive your monitor after you have been enrolled.   Once you have received you monitor, please review enclosed instructions.  Your monitor has already been registered assigning a specific monitor serial # to you.   Applying the monitor   Shave hair from upper left chest.   Hold abrader disc by orange tab.  Rub abrader in 40 strokes over left upper chest as indicated in your monitor instructions.   Clean area with 4 enclosed alcohol pads .  Use all pads to assure are is cleaned thoroughly.  Let dry.   Apply patch as indicated in monitor instructions.  Patch will be place under collarbone on left side of chest with arrow pointing upward.   Rub patch adhesive wings for 2 minutes.Remove white label marked "1".  Remove white label marked "2".  Rub patch adhesive wings for 2 additional  minutes.   While looking in a mirror, press and release button in center of patch.  A small green light will flash 3-4 times .  This will be  your only indicator the monitor has been turned on.     Do not shower for the first 24 hours.  You may shower after the first 24 hours.   Press button if you feel a symptom. You will hear a small click.  Record Date, Time and Symptom in the Patient Log Book.   When you are ready to remove patch, follow instructions on last 2 pages of Patient Log Book.  Stick patch monitor onto last page of Patient Log Book.   Place Patient Log Book in [place] box.  Use locking tab on box and tape box closed securely.  The [employer] and [employer] has [employer] on it.  Please place in mailbox as soon as possible.  Your physician should have your test results approximately 7 days after the monitor has been mailed back to [facility].   Call [place] at [phone] if you have questions regarding your ZIO XT patch monitor.  Call them immediately if you see an orange light blinking on your monitor.   If your monitor falls off in less than 4 days contact our Monitor department at [phone].  If your monitor becomes loose or falls off after 4 days call [employer] at [phone] for suggestions on securing your monitor.   CT coronary calcium score. This test is done at [address]. This is $150 out of pocket.   Coronary CalciumScan A coronary calcium scan is an imaging test used to look for deposits of calcium and other fatty materials (plaques) in the inner lining of the blood vessels of the heart (coronary arteries). These deposits of calcium and plaques can partly clog and narrow the coronary arteries without producing any symptoms or warning signs. This puts a person at risk for a heart attack. This test can detect these deposits before symptoms develop. Tell a health care provider about:  Any allergies you have.  All medicines you are taking, including vitamins, herbs, eye drops, creams, and over-the-counter medicines.  Any problems you or family members have had  with anesthetic medicines.  Any blood disorders you have.  Any surgeries you have had.  Any medical conditions you have.  Whether you are pregnant or may be pregnant. What are the risks? Generally, this is a safe procedure. However, problems may occur, including:  Harm to a pregnant woman and her unborn baby. This test involves the use of radiation. Radiation exposure can be dangerous to a pregnant woman and her unborn baby. If you are pregnant, you generally should not have this procedure done.  Slight increase in the risk of cancer. This is because of the radiation involved in the test. What happens before the procedure? No preparation is needed for this procedure. What happens during the procedure?  You will undress and remove any jewelry around your neck or chest.  You will put on a hospital gown.  Sticky electrodes will be placed on your chest. The electrodes will be connected to an electrocardiogram (ECG) machine to record a tracing of the electrical activity of your heart.  A CT scanner will take pictures of your heart. During this time, you will be asked to lie still and hold your breath for 2-3 seconds while a picture of your heart is being taken. The procedure may vary among  health care providers and hospitals. What happens after the procedure?  You can get dressed.  You can return to your normal activities.  It is up to you to get the results of your test. Ask your health care provider, or the department that is doing the test, when your results will be ready. Summary  A coronary calcium scan is an imaging test used to look for deposits of calcium and other fatty materials (plaques) in the inner lining of the blood vessels of the heart (coronary arteries).  Generally, this is a safe procedure. Tell your health care provider if you are pregnant or may be pregnant.  No preparation is needed for this procedure.  A CT scanner will take pictures of your heart.  You  can return to your normal activities after the scan is done. This information is not intended to replace advice given to you by your health care provider. Make sure you discuss any questions you have with your health care provider. Document Released: 01/07/2008 Document Revised: 05/30/2016 Document Reviewed: 05/30/2016 Elsevier Interactive Patient Education  2017 [employer]: At [facility], you and your health needs are our priority.  As part of our continuing mission to provide you with exceptional heart care, we have created designated Provider Care Teams.  These Care Teams include your primary Cardiologist (physician) and Advanced Practice Providers (APPs -  Physician Assistants and Nurse Practitioners) who all work together to provide you with the care you need, when you need it.  We recommend signing up for the patient portal called "MyChart".  Sign up information is provided on this After Visit Summary.  MyChart is used to connect with patients for Virtual Visits (Telemedicine).  Patients are able to view lab/test results, encounter notes, upcoming appointments, etc.  Non-urgent messages can be sent to your provider as well.   To learn more about what you can do with MyChart, go to [email or web address].    Your next appointment:   3 month(s)  The format for your next appointment:   In Person  Provider:   Oswaldo Milian, MD       Signed, Donato Heinz, MD  12/20/2019 12:05 AM    [place]

## 2019-12-19 NOTE — Patient Instructions (Signed)
Medication Instructions:  Your physician recommends that you continue on your current medications as directed. Please refer to the Current Medication list given to you today.  *If you need a refill on your cardiac medications before your next appointment, please call your pharmacy*  Testing/Procedures: Your physician has requested that you have an echocardiogram. Echocardiography is a painless test that uses sound waves to create images of your heart. It provides your doctor with information about the size and shape of your heart and how well your heart's chambers and valves are working. This procedure takes approximately one hour. There are no restrictions for this procedure. This will be done at our [facility] location:  [place] has requested you wear your ZIO patch monitor 14 days.   This is a single patch monitor.  Irhythm supplies one patch monitor per enrollment.  Additional stickers are not available.   Please do not apply patch if you will be having a Nuclear Stress Test, Echocardiogram, Cardiac CT, MRI, or Chest Xray during the time frame you would be wearing the monitor. The patch cannot be worn during these tests.  You cannot remove and re-apply the ZIO XT patch monitor.   Your ZIO patch monitor will be sent USPS Priority mail from [facility] directly to your home address. The monitor may also be mailed to a PO BOX if home delivery is not available.   It may take 3-5 days to receive your monitor after you have been enrolled.   Once you have received you monitor, please review enclosed instructions.  Your monitor has already been registered assigning a specific monitor serial # to you.   Applying the monitor   Shave hair from upper left chest.   Hold abrader disc by orange tab.  Rub abrader in 40 strokes over left upper chest as indicated in your monitor instructions.   Clean area with  4 enclosed alcohol pads .  Use all pads to assure are is cleaned thoroughly.  Let dry.   Apply patch as indicated in monitor instructions.  Patch will be place under collarbone on left side of chest with arrow pointing upward.   Rub patch adhesive wings for 2 minutes.Remove white label marked "1".  Remove white label marked "2".  Rub patch adhesive wings for 2 additional minutes.   While looking in a mirror, press and release button in center of patch.  A small green light will flash 3-4 times .  This will be your only indicator the monitor has been turned on.     Do not shower for the first 24 hours.  You may shower after the first 24 hours.   Press button if you feel a symptom. You will hear a small click.  Record Date, Time and Symptom in the Patient Log Book.   When you are ready to remove patch, follow instructions on last 2 pages of Patient Log Book.  Stick patch monitor onto last page of Patient Log Book.   Place Patient Log Book in [place] box.  Use locking tab on box and tape box closed securely.  The [employer] and [employer] has [employer] on it.  Please place in mailbox as soon as possible.  Your physician should have your test results approximately 7 days after the monitor has been mailed back to [facility].   Call [place] at [phone] if you have questions  regarding your ZIO XT patch monitor.  Call them immediately if you see an orange light blinking on your monitor.   If your monitor falls off in less than 4 days contact our Monitor department at [phone].  If your monitor becomes loose or falls off after 4 days call [employer] at [phone] for suggestions on securing your monitor.   CT coronary calcium score. This test is done at [address]. This is $150 out of pocket.   Coronary CalciumScan A coronary calcium scan is an imaging test used to look for deposits of calcium and other fatty materials (plaques) in the inner lining  of the blood vessels of the heart (coronary arteries). These deposits of calcium and plaques can partly clog and narrow the coronary arteries without producing any symptoms or warning signs. This puts a person at risk for a heart attack. This test can detect these deposits before symptoms develop. Tell a health care provider about:  Any allergies you have.  All medicines you are taking, including vitamins, herbs, eye drops, creams, and over-the-counter medicines.  Any problems you or family members have had with anesthetic medicines.  Any blood disorders you have.  Any surgeries you have had.  Any medical conditions you have.  Whether you are pregnant or may be pregnant. What are the risks? Generally, this is a safe procedure. However, problems may occur, including:  Harm to a pregnant woman and her unborn baby. This test involves the use of radiation. Radiation exposure can be dangerous to a pregnant woman and her unborn baby. If you are pregnant, you generally should not have this procedure done.  Slight increase in the risk of cancer. This is because of the radiation involved in the test. What happens before the procedure? No preparation is needed for this procedure. What happens during the procedure?  You will undress and remove any jewelry around your neck or chest.  You will put on a hospital gown.  Sticky electrodes will be placed on your chest. The electrodes will be connected to an electrocardiogram (ECG) machine to record a tracing of the electrical activity of your heart.  A CT scanner will take pictures of your heart. During this time, you will be asked to lie still and hold your breath for 2-3 seconds while a picture of your heart is being taken. The procedure may vary among health care providers and hospitals. What happens after the procedure?  You can get dressed.  You can return to your normal activities.  It is up to you to get the results of your test. Ask  your health care provider, or the department that is doing the test, when your results will be ready. Summary  A coronary calcium scan is an imaging test used to look for deposits of calcium and other fatty materials (plaques) in the inner lining of the blood vessels of the heart (coronary arteries).  Generally, this is a safe procedure. Tell your health care provider if you are pregnant or may be pregnant.  No preparation is needed for this procedure.  A CT scanner will take pictures of your heart.  You can return to your normal activities after the scan is done. This information is not intended to replace advice given to you by your health care provider. Make sure you discuss any questions you have with your health care provider. Document Released: 01/07/2008 Document Revised: 05/30/2016 Document Reviewed: 05/30/2016 Elsevier Interactive Patient Education  2017 [employer].  Follow-Up: [facility], you and your health needs are our priority.  As part of our continuing mission to provide you with exceptional heart care, we have created designated Provider Care Teams.  These Care Teams include your primary Cardiologist (physician) and Advanced Practice Providers (APPs -  Physician Assistants and Nurse Practitioners) who all work together to provide you with the care you need, when you need it.  We recommend signing up for the patient portal called "MyChart".  Sign up information is provided on this After Visit Summary.  MyChart is used to connect with patients for Virtual Visits (Telemedicine).  Patients are able to view lab/test results, encounter notes, upcoming appointments, etc.  Non-urgent messages can be sent to your provider as well.   To learn more about what you can do with MyChart, go to [email or web address].    Your next appointment:   3 month(s)  The format for your next appointment:   In Person  Provider:   Oswaldo Milian, MD

## 2019-12-19 NOTE — Telephone Encounter (Signed)
Enrolled patient for a 14 day Zio monitor to be mailed to patients home.  

## 2019-12-31 ENCOUNTER — Telehealth: Payer: Self-pay | Admitting: Cardiology

## 2019-12-31 NOTE — Telephone Encounter (Signed)
Pt is in [place] and visiting her mother in law that is terminally ill and worried that she received a message from [employer] ex that her monitor will be delivered today and there is no number for her to call asking them not to deliver and she is stressed about a package sitting on her porch.   I advised her not to worry and to relax and I will forward her message to our monitor nurses.. Pt had My chart message sent as well.

## 2019-12-31 NOTE — Telephone Encounter (Signed)
Patient calling in regards to her mychart message she sent yesterday about her heart monitor.

## 2019-12-31 NOTE — Telephone Encounter (Signed)
Spoke to patient and advised her there must have been a typo/miscomunication. I was told to order the monitor for 6/7 and not 6/17. Explained to patient monitor should be fine on her porch please inspect it before applying and look for the green blinking lights when starting it. If she needs a new one she would not be billed and would just need to call us to have another shipped out. Patient thanked me and knows to call if she needs further assistance

## 2020-01-07 ENCOUNTER — Other Ambulatory Visit: Payer: Self-pay

## 2020-01-07 ENCOUNTER — Ambulatory Visit (INDEPENDENT_AMBULATORY_CARE_PROVIDER_SITE_OTHER)
Admission: RE | Admit: 2020-01-07 | Discharge: 2020-01-07 | Disposition: A | Discharge status: Home / Self Care | Payer: Self-pay | Source: Ambulatory Visit | Attending: Cardiology | Admitting: Cardiology

## 2020-01-07 DIAGNOSIS — E785 Hyperlipidemia, unspecified: Secondary | ICD-10-CM

## 2020-01-08 ENCOUNTER — Other Ambulatory Visit: Payer: Self-pay | Admitting: Family Medicine

## 2020-01-09 ENCOUNTER — Other Ambulatory Visit: Payer: Self-pay

## 2020-01-09 ENCOUNTER — Ambulatory Visit (HOSPITAL_COMMUNITY): Payer: BLUE CROSS/BLUE SHIELD | Attending: Cardiology

## 2020-01-09 DIAGNOSIS — R002 Palpitations: Secondary | ICD-10-CM | POA: Insufficient documentation

## 2020-01-10 NOTE — Telephone Encounter (Signed)
Ct cardiac scoring printed and mailed to pt per pt request

## 2020-01-11 ENCOUNTER — Ambulatory Visit (INDEPENDENT_AMBULATORY_CARE_PROVIDER_SITE_OTHER): Payer: BLUE CROSS/BLUE SHIELD

## 2020-01-11 DIAGNOSIS — R002 Palpitations: Secondary | ICD-10-CM

## 2020-03-22 NOTE — Progress Notes (Signed)
Cardiology Office Note:    Date:  03/24/2020   ID:  Katie Todd, DOB 1960/09/01, MRN [id number]  PCP:  Owens Loffler, MD  Cardiologist:  No primary care provider on file.  Electrophysiologist:  None   Referring MD: Owens Loffler, MD   Chief Complaint  Patient presents with   Palpitations    History of Present Illness:    Katie Todd is a 59 y.o. female with a hx of histoplasmosis who presents for follow-up.  She was referred by Dr. Lorelei Todd for evaluation of tachycardia and shortness of breath, initially seen on 12/19/2019.  She reports that she has had issues with acute episodes of dizziness, tachycardia, and shortness of breath since 11/13/07.  Holter monitor at that time showed rare PACs, occasional PVCs.  Reports she has had 4 of these episodes in the last 8-month.  States that episodes are often preceded by abdominal cramps.  Then she will become short of breath and have dizziness and near syncopal episodes.  She denies any chest pain.  States that during episodes her heart beat is irregular.  Checked her BP during last episode was up to 140 over 90s (usually 100s over 60s).  Reports she exercises 6 miles per day on the elliptical.  Occasional dyspnea.  Denies any chest pain, lightheadedness, or syncope with exertion.  She smoked 1 to 2 packs/day x 15 years, quit in November 13, 1990.  Her father died of MI at age 26.  Mother had CHF, unclear cause.  Reports both her mother's sister and brother had aneurysms.  ABIs on 11/15/2019 were normal.  Echocardiogram on 01/09/2020 showed LVEF 50 to 55%, normal RV function, no significant valvular disease.  Zio patch x14 days on 02/17/2020 showed 22 episodes of SVT, longest lasting 21 seconds, two 4 beat episodes of NSVT, occasional PVCs (1.6%).  Calcium score on 01/07/2020 was 0.  Since last clinic visit, she reports that she has continued to have episodes of palpitations.  States he did not have any significant episodes when she wore her heart monitor.   However last Friday had 2 episodes where he felt like he was going to pass our.  During episode she reports that she gets short of breath and dizzy and feels like heart is racing.  Lasted for 2 to 3 minutes and resolved.  States that lying down, putting her feet up, and bearing down will help episodes break.    Past Medical History:  Diagnosis Date   Anxiety state, unspecified 02/23/2013   GERD (gastroesophageal reflux disease) 02/23/2013   Histoplasmosis 2004/11/12   Hot flashes, menopausal    HPV test positive    Insomnia    PAC (premature atrial contraction)    PVC (premature ventricular contraction)    Seasonal allergies 02/23/2013    Past Surgical History:  Procedure Laterality Date   COLPOSCOPY  2008-11-12   Negative   DILATION AND CURETTAGE OF UTERUS     missed ab   NOSE SURGERY      Current Medications: Current Meds  Medication Sig   albuterol (VENTOLIN HFA) 108 (90 Base) MCG/ACT inhaler Inhale 2 puffs into the lungs every 4 (four) hours as needed for wheezing.   aspirin 81 MG chewable tablet Chew 81 mg by mouth daily.   benzonatate (TESSALON PERLES) 100 MG capsule Take 1 capsule (100 mg total) by mouth 3 (three) times daily as needed.   Biotin 5000 MCG TABS Take 2 tablets by mouth daily.   calcium carbonate 1250 MG capsule Take 1,250  mg by mouth 2 (two) times daily with a meal.   cetirizine (ZYRTEC) 10 MG tablet Take 10 mg by mouth daily.   diclofenac (VOLTAREN) 75 MG EC tablet Take 1 tablet (75 mg total) by mouth 2 (two) times daily.   diphenhydrAMINE (BENADRYL ALLERGY) 25 mg capsule Take 50 mg by mouth every 6 (six) hours as needed.    docusate sodium (COLACE) 100 MG capsule Take 200 mg by mouth 2 (two) times daily.   doxylamine, Sleep, (UNISOM) 25 MG tablet Take 25 mg by mouth at bedtime as needed.    Flaxseed, Linseed, (FLAX SEED OIL) 1300 MG CAPS    fluticasone (FLONASE) 50 MCG/ACT nasal spray SPRAY 2 SPRAYS IN EACH NOSTRIL NIGHTLY   Magnesium 400 MG TABS  Take 1 tablet by mouth 2 (two) times daily.   montelukast (SINGULAIR) 10 MG tablet TAKE 1 TABLET BY MOUTH EVERYDAY AT BEDTIME   Multiple Vitamin (MULTIVITAMIN) tablet Take 1 tablet by mouth daily.   pseudoephedrine (SUDAFED) 30 MG tablet      Allergies:   Penicillins   Social History   Socioeconomic History   Marital status: Married    Spouse name: Not on file   Number of children: Not on file   Years of education: Not on file   Highest education level: Not on file  Occupational History   Not on file  Tobacco Use   Smoking status: Former Smoker    Types: Cigarettes    Quit date: 02/20/1990    Years since quitting: 30.1   Smokeless tobacco: Never Used  Vaping Use   Vaping Use: Never used  Substance and Sexual Activity   Alcohol use: Yes    Alcohol/week: 0.0 standard drinks    Comment: rarely   Drug use: No   Sexual activity: Yes    Partners: Male    Birth control/protection: Surgical    Comment: tubal ligation  Other Topics Concern   Not on file  Social History Narrative   Not on file   Social Determinants of Health   Financial Resource Strain:    Difficulty of Paying Living Expenses: Not on file  Food Insecurity:    Worried About Charity fundraiser in the Last Year: Not on file   [employer] of Food in the Last Year: Not on file  Transportation Needs:    Lack of Transportation (Medical): Not on file   Lack of Transportation (Non-Medical): Not on file  Physical Activity:    Days of Exercise per Week: Not on file   Minutes of Exercise per Session: Not on file  Stress:    Feeling of Stress : Not on file  Social Connections:    Frequency of Communication with Friends and Family: Not on file   Frequency of Social Gatherings with Friends and Family: Not on file   Attends Religious Services: Not on file   Active Member of Clubs or Organizations: Not on file   Attends Archivist Meetings: Not on file   Marital Status: Not on  file     Family History: The patient's family history includes Breast cancer (age of onset: 53) in her mother.  ROS:   Please see the history of present illness.    All other systems reviewed and are negative.  EKGs/Labs/Other Studies Reviewed:    The following studies were reviewed today:   EKG:  EKG is ordered today.  The ekg ordered today demonstrates normal sinus rhythm, rate 89, no ST/T abnormality, poor  R wave progression  Recent Labs: No results found for requested labs within last 8760 hours.  Recent Lipid Panel    Component Value Date/Time   CHOL 199 08/01/2018 0749   TRIG 65.0 08/01/2018 0749   HDL 72.80 08/01/2018 0749   CHOLHDL 3 08/01/2018 0749   VLDL 13.0 08/01/2018 0749   LDLCALC 113 (H) 08/01/2018 0749    Physical Exam:    VS:  BP (!) 142/80    Pulse 89    Ht 5\' 4"  (1.626 m)    Wt 140 lb (63.5 kg)    LMP 01/24/2014 Comment: october 2014 was the one prior to this July   SpO2 98%    BMI 24.03 kg/m     Wt Readings from Last 3 Encounters:  03/24/20 140 lb (63.5 kg)  12/19/19 134 lb 3.2 oz (60.9 kg)  12/11/19 129 lb 8 oz (58.7 kg)     GEN: in no acute distress HEENT: Normal NECK: No JVD; No carotid bruits LYMPHATICS: No lymphadenopathy CARDIAC: RRR, no murmurs, rubs, gallops RESPIRATORY:  Clear to auscultation without rales, wheezing or rhonchi  ABDOMEN: Soft, non-tender, non-distended MUSCULOSKELETAL:  No edema; No deformity  SKIN: Warm and dry NEUROLOGIC:  Alert and oriented x 3 PSYCHIATRIC:  Normal affect   ASSESSMENT:    1. SVT (supraventricular tachycardia) ([place])   2. Hyperlipidemia, unspecified hyperlipidemia type    PLAN:    SVT: Reported episodes of lightheadedness and palpitations that often break with vagal maneuvers, consistent with SVT.  Echocardiogram on 01/09/2020 showed LVEF 50 to 55%, normal RV function, no significant valvular disease.  Zio patch x14 days on 02/17/2020 showed 22 episodes of SVT, longest lasting 21 seconds, two 4  beat episodes of NSVT, occasional PVCs (1.6%).  -Start metoprolol 25 mg twice daily  Hyperlipidemia: Last LDL 113 on 08/01/2018.  10-year ASCVD risk score is 2%.   Calcium score on 01/07/2020 was 0.   RTC in 3 months  Medication Adjustments/Labs and Tests Ordered: Current medicines are reviewed at length with the patient today.  Concerns regarding medicines are outlined above.  Orders Placed This Encounter  Procedures   EKG 12-Lead   Meds ordered this encounter  Medications   metoprolol tartrate (LOPRESSOR) 25 MG tablet    Sig: Take 1 tablet (25 mg total) by mouth 2 (two) times daily.    Dispense:  180 tablet    Refill:  3    Patient Instructions  Medication Instructions:  START metoprolol tartrate (Lopressor) 25 mg two times daily  *If you need a refill on your cardiac medications before your next appointment, please call your pharmacy*  Follow-Up: At [facility], you and your health needs are our priority.  As part of our continuing mission to provide you with exceptional heart care, we have created designated Provider Care Teams.  These Care Teams include your primary Cardiologist (physician) and Advanced Practice Providers (APPs -  Physician Assistants and Nurse Practitioners) who all work together to provide you with the care you need, when you need it.  We recommend signing up for the patient portal called "MyChart".  Sign up information is provided on this After Visit Summary.  MyChart is used to connect with patients for Virtual Visits (Telemedicine).  Patients are able to view lab/test results, encounter notes, upcoming appointments, etc.  Non-urgent messages can be sent to your provider as well.   To learn more about what you can do with MyChart, go to [email or web address].    Your next appointment:  3 month(s)  The format for your next appointment:   In Person  Provider:   Oswaldo Milian, MD        Signed, Donato Heinz, MD  03/24/2020  7:36 PM    [place]

## 2020-03-24 ENCOUNTER — Other Ambulatory Visit: Payer: Self-pay

## 2020-03-24 ENCOUNTER — Encounter: Payer: Self-pay | Admitting: Cardiology

## 2020-03-24 ENCOUNTER — Ambulatory Visit (INDEPENDENT_AMBULATORY_CARE_PROVIDER_SITE_OTHER): Payer: BLUE CROSS/BLUE SHIELD | Admitting: Cardiology

## 2020-03-24 VITALS — BP 142/80 | HR 89 | Ht 64.0 in | Wt 140.0 lb

## 2020-03-24 DIAGNOSIS — I471 Supraventricular tachycardia: Secondary | ICD-10-CM

## 2020-03-24 DIAGNOSIS — E785 Hyperlipidemia, unspecified: Secondary | ICD-10-CM | POA: Diagnosis not present

## 2020-03-24 MED ORDER — METOPROLOL TARTRATE 25 MG PO TABS: 25.0000 mg | ORAL_TABLET | Freq: Two times a day (BID) | ORAL | 3 refills | Status: DC

## 2020-03-24 NOTE — Patient Instructions (Signed)
Medication Instructions:  START metoprolol tartrate (Lopressor) 25 mg two times daily  *If you need a refill on your cardiac medications before your next appointment, please call your pharmacy*  Follow-Up: At [facility], you and your health needs are our priority.  As part of our continuing mission to provide you with exceptional heart care, we have created designated Provider Care Teams.  These Care Teams include your primary Cardiologist (physician) and Advanced Practice Providers (APPs -  Physician Assistants and Nurse Practitioners) who all work together to provide you with the care you need, when you need it.  We recommend signing up for the patient portal called "MyChart".  Sign up information is provided on this After Visit Summary.  MyChart is used to connect with patients for Virtual Visits (Telemedicine).  Patients are able to view lab/test results, encounter notes, upcoming appointments, etc.  Non-urgent messages can be sent to your provider as well.   To learn more about what you can do with MyChart, go to [email or web address].    Your next appointment:   3 month(s)  The format for your next appointment:   In Person  Provider:   Oswaldo Milian, MD

## 2020-04-03 ENCOUNTER — Other Ambulatory Visit: Payer: Self-pay | Admitting: Family Medicine

## 2020-04-03 NOTE — Telephone Encounter (Signed)
Last office visit 12/11/2019 for hip pain/rash.  Last refilled 12/11/2019 for #60 with 3 refills.  No future appointments with PCP.

## 2020-04-06 ENCOUNTER — Telehealth: Payer: Self-pay | Admitting: Family Medicine

## 2020-04-06 DIAGNOSIS — R7989 Other specified abnormal findings of blood chemistry: Secondary | ICD-10-CM

## 2020-04-06 NOTE — Telephone Encounter (Signed)
This was done.

## 2020-04-06 NOTE — Telephone Encounter (Signed)
Pt is coming in next month for her CPE and is requesting when you do her lab work that you order a thyroid profile b/c her TSH has been high.  She is concerned b/c hers has been creeping up and thyroid disease runs in her family.  Thank you!

## 2020-04-15 ENCOUNTER — Telehealth: Payer: Self-pay | Admitting: Cardiology

## 2020-04-15 NOTE — Telephone Encounter (Signed)
Recommend switching from metoprolol to diltiazem CD 120 mg daily for her SVT.  Diltiazem can be used as treatment for SVT and may have some benefit if she does have Raynaud's.

## 2020-04-15 NOTE — Telephone Encounter (Signed)
New Message:     Pt is having problems with her fingertips going numb lasting for  from 15 minutes to an hour. Please call, pt is very concerned.

## 2020-04-15 NOTE — Telephone Encounter (Signed)
Spoke with Katie Todd, for sometime now she has noticed numbness in her middle and pointer fingers on the right hand. Today it also involved the left thumb. She reports her finger tips and fingernails go numb and turn a white color. It will eventually go away. She was recently tested for Reynold's by her medical doctor because she also has numbness in her toes and her feet will turn dark, she reports that test was negative. She has been on the metoprolol for about 4 weeks now and she feels this numbness is getting worse. This will happen 1 daily usually mid-day and she will feel tired prior to it occurring. Aware dr Gardiner Rhyme has left for today but will forward for his review.

## 2020-04-16 MED ORDER — DILTIAZEM HCL ER COATED BEADS 120 MG PO CP24
120.0000 mg | ORAL_CAPSULE | Freq: Every day | ORAL | 3 refills | Status: DC
Start: 2020-04-16 — End: 2020-05-25

## 2020-04-16 NOTE — Telephone Encounter (Signed)
Patient aware and verbalized understanding.  rx sent to pharmacy. 

## 2020-04-28 ENCOUNTER — Other Ambulatory Visit: Payer: Self-pay | Admitting: Family Medicine

## 2020-04-28 DIAGNOSIS — Z79899 Other long term (current) drug therapy: Secondary | ICD-10-CM

## 2020-04-28 DIAGNOSIS — Z131 Encounter for screening for diabetes mellitus: Secondary | ICD-10-CM

## 2020-04-28 DIAGNOSIS — R7989 Other specified abnormal findings of blood chemistry: Secondary | ICD-10-CM

## 2020-04-28 DIAGNOSIS — E785 Hyperlipidemia, unspecified: Secondary | ICD-10-CM

## 2020-05-04 ENCOUNTER — Other Ambulatory Visit: Payer: Self-pay

## 2020-05-04 ENCOUNTER — Other Ambulatory Visit (INDEPENDENT_AMBULATORY_CARE_PROVIDER_SITE_OTHER): Payer: BLUE CROSS/BLUE SHIELD

## 2020-05-04 DIAGNOSIS — Z79899 Other long term (current) drug therapy: Secondary | ICD-10-CM

## 2020-05-04 DIAGNOSIS — E785 Hyperlipidemia, unspecified: Secondary | ICD-10-CM

## 2020-05-04 DIAGNOSIS — R7989 Other specified abnormal findings of blood chemistry: Secondary | ICD-10-CM

## 2020-05-04 DIAGNOSIS — Z131 Encounter for screening for diabetes mellitus: Secondary | ICD-10-CM | POA: Diagnosis not present

## 2020-05-04 LAB — BASIC METABOLIC PANEL
BUN: 22 mg/dL (ref 6–23)
CO2: 30 mEq/L (ref 19–32)
Calcium: 10.1 mg/dL (ref 8.4–10.5)
Chloride: 103 mEq/L (ref 96–112)
Creatinine, Ser: 0.67 mg/dL (ref 0.40–1.20)
GFR: 95.77 mL/min (ref >60.00)
Glucose, Bld: 81 mg/dL (ref 70–99)
Potassium: 4.2 mEq/L (ref 3.5–5.1)
Sodium: 140 mEq/L (ref 135–145)

## 2020-05-04 LAB — LIPID PANEL
Cholesterol: 176 mg/dL (ref 0–200)
HDL: 81.2 mg/dL (ref >39.00)
LDL Cholesterol: 82 mg/dL (ref 0–99)
NonHDL: 94.52
Total CHOL/HDL Ratio: 2
Triglycerides: 63 mg/dL (ref 0.0–149.0)
VLDL: 12.6 mg/dL (ref 0.0–40.0)

## 2020-05-04 LAB — CBC WITH DIFFERENTIAL/PLATELET
Basophils Absolute: 0.1 10*3/uL (ref 0.0–0.1)
Basophils Relative: 0.9 % (ref 0.0–3.0)
Eosinophils Absolute: 0.1 10*3/uL (ref 0.0–0.7)
Eosinophils Relative: 1.7 % (ref 0.0–5.0)
HCT: 40.1 % (ref 36.0–46.0)
Hemoglobin: 13.6 g/dL (ref 12.0–15.0)
Lymphocytes Relative: 40.9 % (ref 12.0–46.0)
Lymphs Abs: 2.5 10*3/uL (ref 0.7–4.0)
MCHC: 33.8 g/dL (ref 30.0–36.0)
MCV: 93.4 fl (ref 78.0–100.0)
Monocytes Absolute: 0.5 10*3/uL (ref 0.1–1.0)
Monocytes Relative: 9.1 % (ref 3.0–12.0)
Neutro Abs: 2.8 10*3/uL (ref 1.4–7.7)
Neutrophils Relative %: 47.4 % (ref 43.0–77.0)
Platelets: 352 10*3/uL (ref 150.0–400.0)
RBC: 4.29 Mil/uL (ref 3.87–5.11)
RDW: 14.1 % (ref 11.5–15.5)
WBC: 6 10*3/uL (ref 4.0–10.5)

## 2020-05-04 LAB — TSH: TSH: 2.94 u[IU]/mL (ref 0.35–4.50)

## 2020-05-04 LAB — HEPATIC FUNCTION PANEL
ALT: 17 U/L (ref 0–35)
AST: 21 U/L (ref 0–37)
Albumin: 4.2 g/dL (ref 3.5–5.2)
Alkaline Phosphatase: 55 U/L (ref 39–117)
Bilirubin, Direct: 0.1 mg/dL (ref 0.0–0.3)
Total Bilirubin: 0.3 mg/dL (ref 0.2–1.2)
Total Protein: 6.4 g/dL (ref 6.0–8.3)

## 2020-05-04 LAB — HEMOGLOBIN A1C: Hgb A1c MFr Bld: 5.5 % (ref 4.6–6.5)

## 2020-05-04 LAB — T4, FREE: Free T4: 1.28 ng/dL (ref 0.60–1.60)

## 2020-05-04 LAB — T3, FREE: T3, Free: 5.8 pg/mL — ABNORMAL HIGH (ref 2.3–4.2)

## 2020-05-07 DIAGNOSIS — Z8249 Family history of ischemic heart disease and other diseases of the circulatory system: Secondary | ICD-10-CM | POA: Insufficient documentation

## 2020-05-11 ENCOUNTER — Other Ambulatory Visit: Payer: Self-pay

## 2020-05-11 ENCOUNTER — Ambulatory Visit (INDEPENDENT_AMBULATORY_CARE_PROVIDER_SITE_OTHER): Payer: BC Managed Care – PPO | Admitting: Family Medicine

## 2020-05-11 ENCOUNTER — Encounter: Payer: Self-pay | Admitting: Family Medicine

## 2020-05-11 VITALS — BP 120/68 | HR 99 | Temp 97.8°F | Resp 16 | Ht 64.25 in | Wt 137.0 lb

## 2020-05-11 DIAGNOSIS — Z0000 Encounter for general adult medical examination without abnormal findings: Secondary | ICD-10-CM

## 2020-05-11 NOTE — Progress Notes (Signed)
Katie Rehberg T. Dominiqua Cooner, MD, [place] at [facility] [place], [address]  Phone: [phone]  FAX: [phone]  Katie Todd - 59 y.o. female  MRN [id number]  Date of Birth: [birth year]  Date: 05/11/2020  PCP: Owens Loffler, MD  Referral: Owens Loffler, MD  Chief Complaint  Patient presents with  . CPE    This visit occurred during the SARS-CoV-2 public health emergency.  Safety protocols were in place, including screening questions prior to the visit, additional usage of staff PPE, and extensive cleaning of exam room while observing appropriate contact time as indicated for disinfecting solutions.   Patient Care Team: Owens Loffler, MD as PCP - General (Family Medicine) Subjective:   Katie Todd is a 59 y.o. pleasant patient who presents with the following:  Health Maintenance Summary Reviewed and updated, unless pt declines services.  Tobacco History Reviewed. Non-smoker Alcohol: No concerns, no excessive use Exercise Habits: Some activity, rec at least 30 mins 5 times a week, 6 days a week STD concerns: none Drug Use: None Lumps or breast concerns: no  Mammography is due  Pap in 2018, so she should be good until 2023 GYN -   Sleeps about four or 6 hours at night  Health Maintenance  Topic Date Due  . HIV Screening  Never done  . MAMMOGRAM  06/01/2019  . PAP SMEAR-Modifier  05/15/2020  . COLONOSCOPY  11/15/2020  . TETANUS/TDAP  09/15/2026  . INFLUENZA VACCINE  Completed  . COVID-19 Vaccine  Completed  . Hepatitis C Screening  Completed    Immunization History  Administered Date(s) Administered  . Influenza,inj,Quad PF,6+ Mos 05/15/2017, 06/08/2018, 04/09/2019  . PFIZER SARS-COV-2 Vaccination 10/02/2019, 10/31/2019  . Tdap 09/15/2016   Patient Active Problem List   Diagnosis Date Noted  . Family history of abdominal aortic aneurysm (AAA) x 2 d/c,  [place] 08/08/2018  . Seasonal allergies 02/23/2013  . GERD (gastroesophageal reflux disease) 02/23/2013  . Anxiety state, unspecified 02/23/2013  . History of chronic cough 09/10/2002    Past Medical History:  Diagnosis Date  . Anxiety state, unspecified 02/23/2013  . GERD (gastroesophageal reflux disease) 02/23/2013  . Histoplasmosis 2006  . HPV test positive   . Insomnia   . PAC (premature atrial contraction)   . PVC (premature ventricular contraction)   . Seasonal allergies 02/23/2013    Past Surgical History:  Procedure Laterality Date  . COLPOSCOPY  2010   Negative  . DILATION AND CURETTAGE OF UTERUS     missed ab  . NOSE SURGERY      Family History  Problem Relation Age of Onset  . Breast cancer Mother 86    Past Medical History, Surgical History, Social History, Family History, Problem List, Medications, and Allergies have been reviewed and updated if relevant.  Review of Systems: Pertinent positives are listed above.  Otherwise, a full 14 point review of systems has been done in full and it is negative except where it is noted positive.  Objective:   BP 120/68 (BP Location: Right Arm, Patient Position: Sitting, Cuff Size: Normal)   Pulse 99   Temp 97.8 F (36.6 C) (Temporal)   Resp 16   Ht 5' 4.25" (1.632 m)   Wt 137 lb (62.1 kg)   LMP 01/24/2014 Comment: october 2014 was the one prior to this July  SpO2 98%   BMI 23.33 kg/m  Ideal Body Weight: Weight in (lb) to  have BMI = 25: 146.5 No exam data present Depression screen [facility] 2/9 05/11/2020 08/08/2018 05/17/2017  Decreased Interest 0 0 0  Down, Depressed, Hopeless 0 0 0  PHQ - 2 Score 0 0 0  Altered sleeping 3 - -  Tired, decreased energy 3 - -  Change in appetite 3 - -  Feeling bad or failure about yourself  0 - -  Trouble concentrating 0 - -  Moving slowly or fidgety/restless 0 - -  Suicidal thoughts 0 - -  PHQ-9 Score 9 - -  Difficult doing work/chores Somewhat difficult - -     GEN: well  developed, well nourished, no acute distress Eyes: conjunctiva and lids normal, PERRLA, EOMI ENT: TM clear, nares clear, oral exam WNL Neck: supple, no lymphadenopathy, no thyromegaly, no JVD Pulm: clear to auscultation and percussion, respiratory effort normal CV: regular rate and rhythm, S1-S2, no murmur, rub or gallop, no bruits Chest: no scars, masses, no lumps BREAST: breast exam declined GI: soft, non-tender; no hepatosplenomegaly, masses; active bowel sounds all quadrants GU: GU exam declined Lymph: no cervical, axillary or inguinal adenopathy MSK: gait normal, muscle tone and strength WNL, no joint swelling, effusions, discoloration, crepitus  SKIN: clear, good turgor, color WNL, no rashes, lesions, or ulcerations Neuro: normal mental status, normal strength, sensation, and motion Psych: alert; oriented to person, place and time, normally interactive and not anxious or depressed in appearance.   All labs reviewed with patient. Results for orders placed or performed in visit on 05/04/20  T4, free  Result Value Ref Range   Free T4 1.28 0.60 - 1.60 ng/dL  T3, free  Result Value Ref Range   T3, Free 5.8 (H) 2.3 - 4.2 pg/mL  TSH  Result Value Ref Range   TSH 2.94 0.35 - 4.50 uIU/mL  Basic metabolic panel  Result Value Ref Range   Sodium 140 135 - 145 mEq/L   Potassium 4.2 3.5 - 5.1 mEq/L   Chloride 103 96 - 112 mEq/L   CO2 30 19 - 32 mEq/L   Glucose, Bld 81 70 - 99 mg/dL   BUN 22 6 - 23 mg/dL   Creatinine, Ser 0.67 0.40 - 1.20 mg/dL   GFR 95.77 >60.00 mL/min   Calcium 10.1 8.4 - 10.5 mg/dL  Hepatic function panel  Result Value Ref Range   Total Bilirubin 0.3 0.2 - 1.2 mg/dL   Bilirubin, Direct 0.1 0.0 - 0.3 mg/dL   Alkaline Phosphatase 55 39 - 117 U/L   AST 21 0 - 37 U/L   ALT 17 0 - 35 U/L   Total Protein 6.4 6.0 - 8.3 g/dL   Albumin 4.2 3.5 - 5.2 g/dL  CBC with Differential/Platelet  Result Value Ref Range   WBC 6.0 4.0 - 10.5 K/uL   RBC 4.29 3.87 - 5.11 Mil/uL    Hemoglobin 13.6 12.0 - 15.0 g/dL   HCT 40.1 36 - 46 %   MCV 93.4 78.0 - 100.0 fl   MCHC 33.8 30.0 - 36.0 g/dL   RDW 14.1 11.5 - 15.5 %   Platelets 352.0 150 - 400 K/uL   Neutrophils Relative % 47.4 43 - 77 %   Lymphocytes Relative 40.9 12 - 46 %   Monocytes Relative 9.1 3 - 12 %   Eosinophils Relative 1.7 0 - 5 %   Basophils Relative 0.9 0 - 3 %   Neutro Abs 2.8 1.4 - 7.7 K/uL   Lymphs Abs 2.5 0.7 - 4.0 K/uL  Monocytes Absolute 0.5 0.1 - 1.0 K/uL   Eosinophils Absolute 0.1 0.0 - 0.7 K/uL   Basophils Absolute 0.1 0.0 - 0.1 K/uL  Hemoglobin A1c  Result Value Ref Range   Hgb A1c MFr Bld 5.5 4.6 - 6.5 %  Lipid panel  Result Value Ref Range   Cholesterol 176 0 - 200 mg/dL   Triglycerides 63.0 0 - 149 mg/dL   HDL 81.20 >39.00 mg/dL   VLDL 12.6 0.0 - 40.0 mg/dL   LDL Cholesterol 82 0 - 99 mg/dL   Total CHOL/HDL Ratio 2    NonHDL 94.52    No results found.  Assessment and Plan:     ICD-10-CM   1. Healthcare maintenance  Z00.00    Globally, she is doing pretty well.  She does have some occasional SVT, and she is on Cardizem and followed by cardiology.  Sac from this her exam is entirely normal, she has normal blood pressure, excellent lipids, and perfectly normal weight.  Congratulated her on her active lifestyle and encouraged her to continue to eat well and workout.  Health Maintenance Exam: The patient's preventative maintenance and recommended screening tests for an annual wellness exam were reviewed in full today. Brought up to date unless services declined.  Counselled on the importance of diet, exercise, and its role in overall health and mortality. The patient's FH and SH was reviewed, including their home life, tobacco status, and drug and alcohol status.  Follow-up in 1 year for physical exam or additional follow-up below.  Follow-up: No follow-ups on file. Or follow-up in 1 year if not noted.  Future Appointments  Date Time Provider [place]    06/23/2020 10:20 AM Donato Heinz, MD [facility]    No orders of the defined types were placed in this encounter.  There are no discontinued medications. No orders of the defined types were placed in this encounter.   Signed,  Maud Deed. Candela Krul, MD   Allergies as of 05/11/2020      Reactions   Penicillins Anaphylaxis      Medication List       Accurate as of May 11, 2020  3:04 PM. If you have any questions, ask your nurse or doctor.        albuterol 108 (90 Base) MCG/ACT inhaler Commonly known as: VENTOLIN HFA Inhale 2 puffs into the lungs every 4 (four) hours as needed for wheezing.   aspirin 81 MG chewable tablet Chew 81 mg by mouth daily.   Benadryl Allergy 25 mg capsule Generic drug: diphenhydrAMINE Take 50 mg by mouth every 6 (six) hours as needed.   benzonatate 100 MG capsule Commonly known as: Tessalon Perles Take 1 capsule (100 mg total) by mouth 3 (three) times daily as needed.   Biotin 5000 MCG Tabs Take 2 tablets by mouth daily.   calcium carbonate 1250 MG capsule Take 1,250 mg by mouth 2 (two) times daily with a meal.   cetirizine 10 MG tablet Commonly known as: ZYRTEC Take 10 mg by mouth daily.   diclofenac 75 MG EC tablet Commonly known as: VOLTAREN TAKE 1 TABLET BY MOUTH TWICE A DAY   diltiazem 120 MG 24 hr capsule Commonly known as: CARDIZEM CD Take 1 capsule (120 mg total) by mouth daily.   docusate sodium 100 MG capsule Commonly known as: COLACE Take 200 mg by mouth 2 (two) times daily.   doxylamine (Sleep) 25 MG tablet Commonly known as: UNISOM Take 25 mg by mouth at bedtime  as needed.   Flax Seed Oil 1300 MG Caps   fluticasone 50 MCG/ACT nasal spray Commonly known as: FLONASE SPRAY 2 SPRAYS IN EACH NOSTRIL NIGHTLY   Magnesium 400 MG Tabs Take 1 tablet by mouth 2 (two) times daily.   montelukast 10 MG tablet Commonly known as: SINGULAIR TAKE 1 TABLET BY MOUTH EVERYDAY AT BEDTIME   multivitamin  tablet Take 1 tablet by mouth daily.   Sudafed 30 MG tablet Generic drug: pseudoephedrine

## 2020-05-13 ENCOUNTER — Other Ambulatory Visit: Payer: Self-pay | Admitting: Family Medicine

## 2020-05-21 NOTE — Telephone Encounter (Signed)
Recommend increasing diltiazem dose to 180 mg daily

## 2020-05-25 MED ORDER — DILTIAZEM HCL ER COATED BEADS 180 MG PO CP24
180.0000 mg | ORAL_CAPSULE | Freq: Every day | ORAL | 3 refills | Status: DC
Start: 2020-05-25 — End: 2020-06-23

## 2020-06-04 ENCOUNTER — Ambulatory Visit: Payer: BLUE CROSS/BLUE SHIELD | Attending: Internal Medicine

## 2020-06-04 DIAGNOSIS — Z23 Encounter for immunization: Secondary | ICD-10-CM

## 2020-06-04 NOTE — Progress Notes (Signed)
   Covid-19 Vaccination Clinic  Name:  Signora Zucco    MRN: [id number] DOB: October 20, 1960  06/04/2020  Ms. Schimpf was observed post Covid-19 immunization for 15 minutes without incident. She was provided with Vaccine Information Sheet and instruction to access the V-Safe system.   Ms. Watterson was instructed to call 911 with any severe reactions post vaccine: Marland Kitchen Difficulty breathing  . Swelling of face and throat  . A fast heartbeat  . A bad rash all over body  . Dizziness and weakness

## 2020-06-21 NOTE — Progress Notes (Signed)
Cardiology Office Note:    Date:  06/24/2020   ID:  Katie Todd, DOB 28-Oct-1960, MRN [id number]  PCP:  Owens Loffler, MD  Cardiologist:  No primary care provider on file.  Electrophysiologist:  None   Referring MD: Owens Loffler, MD   Chief Complaint  Patient presents with  . Irregular Heart Beat    History of Present Illness:    Katie Todd is a 59 y.o. female with a hx of histoplasmosis who presents for follow-up.  She was referred by Dr. Lorelei Pont for evaluation of tachycardia and shortness of breath, initially seen on 12/19/2019.  She reports that she has had issues with acute episodes of dizziness, tachycardia, and shortness of breath since 10/23/2007.  Holter monitor at that time showed rare PACs, occasional PVCs.  Reports she has had 4 of these episodes in the last 52-month.  States that episodes are often preceded by abdominal cramps.  Then she will become short of breath and have dizziness and near syncopal episodes.  She denies any chest pain.  States that during episodes her heart beat is irregular.  Checked her BP during last episode was up to 140 over 90s (usually 100s over 60s).  Reports she exercises 6 miles per day on the elliptical.  Occasional dyspnea.  Denies any chest pain, lightheadedness, or syncope with exertion.  She smoked 1 to 2 packs/day x 15 years, quit in 10/23/90.  Her father died of MI at age 81.  Mother had CHF, unclear cause.  Reports both her mother's sister and brother had aneurysms.  ABIs on 11/15/2019 were normal.  Echocardiogram on 01/09/2020 showed LVEF 50 to 55%, normal RV function, no significant valvular disease.  Zio patch x14 days on 02/17/2020 showed 22 episodes of SVT, longest lasting 21 seconds, two 4 beat episodes of NSVT, occasional PVCs (1.6%).  Calcium score on 01/07/2020 was 0.  Since last clinic visit, reports she had a stop diltiazem, as felt that it was worsening her chronic cough.  She switched to metoprolol and reports cough has improved.  She  denies any recent palpitations.    Past Medical History:  Diagnosis Date  . Anxiety state, unspecified 02/23/2013  . GERD (gastroesophageal reflux disease) 02/23/2013  . Histoplasmosis Oct 22, 2004  . HPV test positive   . Insomnia   . PAC (premature atrial contraction)   . PVC (premature ventricular contraction)   . Seasonal allergies 02/23/2013    Past Surgical History:  Procedure Laterality Date  . COLPOSCOPY  2010   Negative  . DILATION AND CURETTAGE OF UTERUS     missed ab  . NOSE SURGERY      Current Medications: Current Meds  Medication Sig  . albuterol (VENTOLIN HFA) 108 (90 Base) MCG/ACT inhaler Inhale 2 puffs into the lungs every 4 (four) hours as needed for wheezing.  Marland Kitchen aspirin 81 MG chewable tablet Chew 81 mg by mouth daily.  . benzonatate (TESSALON PERLES) 100 MG capsule Take 1 capsule (100 mg total) by mouth 3 (three) times daily as needed.  . Biotin 5000 MCG TABS Take 2 tablets by mouth daily.  . calcium carbonate 1250 MG capsule Take 1,250 mg by mouth 2 (two) times daily with a meal.  . cetirizine (ZYRTEC) 10 MG tablet Take 10 mg by mouth daily.  Marland Kitchen Dextromethorphan-guaiFENesin (ROBITUSSIN DM PO)   . diclofenac (VOLTAREN) 75 MG EC tablet TAKE 1 TABLET BY MOUTH TWICE A DAY  . diphenhydrAMINE (BENADRYL ALLERGY) 25 mg capsule Take 50 mg by mouth every  6 (six) hours as needed.   . docusate sodium (COLACE) 100 MG capsule Take 200 mg by mouth 2 (two) times daily.  Marland Kitchen doxylamine, Sleep, (UNISOM) 25 MG tablet Take 25 mg by mouth at bedtime as needed.   . Flaxseed, Linseed, (FLAX SEED OIL) 1300 MG CAPS   . fluticasone (FLONASE) 50 MCG/ACT nasal spray SPRAY 2 SPRAYS IN EACH NOSTRIL NIGHTLY  . Magnesium 400 MG TABS Take 1 tablet by mouth 2 (two) times daily.  . montelukast (SINGULAIR) 10 MG tablet TAKE 1 TABLET BY MOUTH EVERYDAY AT BEDTIME  . Multiple Vitamin (MULTIVITAMIN) tablet Take 1 tablet by mouth daily.  . pseudoephedrine (SUDAFED) 30 MG tablet   . [DISCONTINUED] diltiazem  (CARDIZEM CD) 180 MG 24 hr capsule Take 1 capsule (180 mg total) by mouth daily.  . [DISCONTINUED] metoprolol tartrate (LOPRESSOR) 25 MG tablet Take 25 mg by mouth 2 (two) times daily.     Allergies:   Penicillins   Social History   Socioeconomic History  . Marital status: Married    Spouse name: Not on file  . Number of children: Not on file  . Years of education: Not on file  . Highest education level: Not on file  Occupational History  . Not on file  Tobacco Use  . Smoking status: Former Smoker    Types: Cigarettes    Quit date: 02/20/1990    Years since quitting: 30.3  . Smokeless tobacco: Never Used  Vaping Use  . Vaping Use: Never used  Substance and Sexual Activity  . Alcohol use: Yes    Alcohol/week: 0.0 standard drinks    Comment: rarely  . Drug use: No  . Sexual activity: Yes    Partners: Male    Birth control/protection: Surgical    Comment: tubal ligation  Other Topics Concern  . Not on file  Social History Narrative  . Not on file   Social Determinants of Health   Financial Resource Strain:   . Difficulty of Paying Living Expenses: Not on file  Food Insecurity:   . Worried About Charity fundraiser in the Last Year: Not on file  . Ran Out of Food in the Last Year: Not on file  Transportation Needs:   . Lack of Transportation (Medical): Not on file  . Lack of Transportation (Non-Medical): Not on file  Physical Activity:   . Days of Exercise per Week: Not on file  . Minutes of Exercise per Session: Not on file  Stress:   . Feeling of Stress : Not on file  Social Connections:   . Frequency of Communication with Friends and Family: Not on file  . Frequency of Social Gatherings with Friends and Family: Not on file  . Attends Religious Services: Not on file  . Active Member of Clubs or Organizations: Not on file  . Attends Archivist Meetings: Not on file  . Marital Status: Not on file     Family History: The patient's family history  includes Breast cancer (age of onset: 82) in her mother.  ROS:   Please see the history of present illness.    All other systems reviewed and are negative.  EKGs/Labs/Other Studies Reviewed:    The following studies were reviewed today:   EKG:  EKG is ordered today.  The ekg ordered today demonstrates normal sinus rhythm, rate 62, no ST/T abnormality  Recent Labs: 05/04/2020: ALT 17; BUN 22; Creatinine, Ser 0.67; Hemoglobin 13.6; Platelets 352.0; Potassium 4.2; Sodium 140; TSH  2.94  Recent Lipid Panel    Component Value Date/Time   CHOL 176 05/04/2020 0907   TRIG 63.0 05/04/2020 0907   HDL 81.20 05/04/2020 0907   CHOLHDL 2 05/04/2020 0907   VLDL 12.6 05/04/2020 0907   LDLCALC 82 05/04/2020 0907    Physical Exam:    VS:  BP 108/74   Pulse 62   Ht 5\' 4"  (1.626 m)   Wt 138 lb (62.6 kg)   LMP 01/24/2014 Comment: october 2014 was the one prior to this July  BMI 23.69 kg/m     Wt Readings from Last 3 Encounters:  06/23/20 138 lb (62.6 kg)  05/11/20 137 lb (62.1 kg)  03/24/20 140 lb (63.5 kg)     GEN: in no acute distress HEENT: Normal NECK: No JVD; No carotid bruits LYMPHATICS: No lymphadenopathy CARDIAC: RRR, no murmurs, rubs, gallops RESPIRATORY:  Clear to auscultation without rales, wheezing or rhonchi  ABDOMEN: Soft, non-tender, non-distended MUSCULOSKELETAL:  No edema; No deformity  SKIN: Warm and dry NEUROLOGIC:  Alert and oriented x 3 PSYCHIATRIC:  Normal affect   ASSESSMENT:    1. SVT (supraventricular tachycardia) ([place])   2. Chronic cough   3. Hyperlipidemia, unspecified hyperlipidemia type    PLAN:    SVT: Reported episodes of lightheadedness and palpitations that often break with vagal maneuvers, consistent with SVT.  Echocardiogram on 01/09/2020 showed LVEF 50 to 55%, normal RV function, no significant valvular disease.  Zio patch x14 days on 02/17/2020 showed 22 episodes of SVT, longest lasting 21 seconds, two 4 beat episodes of NSVT, occasional PVCs  (1.6%).  -Reported symptoms of Raynaud's that worsened with starting metoprolol, switched to diltiazem with improvement.  Unfortunately reports that diltiazem worsened her chronic cough, so she switched back to metoprolol.  Reports she is tolerating metoprolol well currently and has not had any episodes of palpitations since starting metoprolol.  Would continue metoprolol for now, if unable to tolerate and continuing to have episodes of SVT, then will refer to EP for ablation evaluation  Hyperlipidemia: Last LDL 113 on 08/01/2018.  10-year ASCVD risk score is 2%.   Calcium score on 01/07/2020 was 0.  Chronic cough: Has been attributed to histoplasmosis.  Will refer to pulmonology for further evaluation  RTC in 6 months  Medication Adjustments/Labs and Tests Ordered: Current medicines are reviewed at length with the patient today.  Concerns regarding medicines are outlined above.  Orders Placed This Encounter  Procedures  . Ambulatory referral to Pulmonology  . EKG 12-Lead   Meds ordered this encounter  Medications  . metoprolol succinate (TOPROL XL) 25 MG 24 hr tablet    Sig: Take 1 tablet (25 mg total) by mouth daily.    Dispense:  90 tablet    Refill:  3    STOP dilt    Patient Instructions  Medication Instructions:  STOP diltiazem  START metoprolol succinate (Toprol XL) 25 mg daily  *If you need a refill on your cardiac medications before your next appointment, please call your pharmacy*  Follow-Up: At [facility], you and your health needs are our priority.  As part of our continuing mission to provide you with exceptional heart care, we have created designated Provider Care Teams.  These Care Teams include your primary Cardiologist (physician) and Advanced Practice Providers (APPs -  Physician Assistants and Nurse Practitioners) who all work together to provide you with the care you need, when you need it.  We recommend signing up for the patient portal called "  MyChart".  Sign  up information is provided on this After Visit Summary.  MyChart is used to connect with patients for Virtual Visits (Telemedicine).  Patients are able to view lab/test results, encounter notes, upcoming appointments, etc.  Non-urgent messages can be sent to your provider as well.   To learn more about what you can do with MyChart, go to [email or web address].    Your next appointment:   6 month(s)  The format for your next appointment:   In Person  Provider:   Oswaldo Milian, MD   Other Instructions You have been referred to: Pulmonology      Signed, Donato Heinz, MD  06/24/2020 4:15 PM    [place]

## 2020-06-23 ENCOUNTER — Encounter: Payer: Self-pay | Admitting: Cardiology

## 2020-06-23 ENCOUNTER — Ambulatory Visit (INDEPENDENT_AMBULATORY_CARE_PROVIDER_SITE_OTHER): Payer: BC Managed Care – PPO | Admitting: Cardiology

## 2020-06-23 ENCOUNTER — Other Ambulatory Visit: Payer: Self-pay

## 2020-06-23 VITALS — BP 108/74 | HR 62 | Ht 64.0 in | Wt 138.0 lb

## 2020-06-23 DIAGNOSIS — E785 Hyperlipidemia, unspecified: Secondary | ICD-10-CM

## 2020-06-23 DIAGNOSIS — I471 Supraventricular tachycardia: Secondary | ICD-10-CM

## 2020-06-23 DIAGNOSIS — R053 Chronic cough: Secondary | ICD-10-CM | POA: Diagnosis not present

## 2020-06-23 MED ORDER — METOPROLOL SUCCINATE ER 25 MG PO TB24
25.0000 mg | ORAL_TABLET | Freq: Every day | ORAL | 3 refills | Status: DC
Start: 2020-06-23 — End: 2021-04-15

## 2020-06-23 NOTE — Patient Instructions (Signed)
Medication Instructions:  STOP diltiazem  START metoprolol succinate (Toprol XL) 25 mg daily  *If you need a refill on your cardiac medications before your next appointment, please call your pharmacy*  Follow-Up: At [facility], you and your health needs are our priority.  As part of our continuing mission to provide you with exceptional heart care, we have created designated Provider Care Teams.  These Care Teams include your primary Cardiologist (physician) and Advanced Practice Providers (APPs -  Physician Assistants and Nurse Practitioners) who all work together to provide you with the care you need, when you need it.  We recommend signing up for the patient portal called "MyChart".  Sign up information is provided on this After Visit Summary.  MyChart is used to connect with patients for Virtual Visits (Telemedicine).  Patients are able to view lab/test results, encounter notes, upcoming appointments, etc.  Non-urgent messages can be sent to your provider as well.   To learn more about what you can do with MyChart, go to [email or web address].    Your next appointment:   6 month(s)  The format for your next appointment:   In Person  Provider:   Oswaldo Milian, MD   Other Instructions You have been referred to: Pulmonology

## 2020-07-01 ENCOUNTER — Telehealth: Payer: Self-pay | Admitting: Cardiology

## 2020-07-01 NOTE — Telephone Encounter (Signed)
Spoke with patient regarding Pulmonary Consult appointment scheduled Wednesday 09/02/20 at 9:00am arrival time is 8:45 am  [address] , [address]--phone is [phone].  Will mail information to patient

## 2020-07-26 ENCOUNTER — Other Ambulatory Visit: Payer: Self-pay | Admitting: Family Medicine

## 2020-08-06 ENCOUNTER — Telehealth: Payer: Self-pay | Admitting: Orthopedic Surgery

## 2020-08-06 NOTE — Telephone Encounter (Signed)
Patient called to ask, since her surgery has been cancelled/postponed, if something may be prescribed in the meantime?

## 2020-08-06 NOTE — Telephone Encounter (Signed)
This patient has not been seen in the office. Closing encounter.

## 2020-09-02 ENCOUNTER — Institutional Professional Consult (permissible substitution): Payer: BC Managed Care – PPO | Admitting: Internal Medicine

## 2020-09-27 ENCOUNTER — Other Ambulatory Visit: Payer: Self-pay | Admitting: Family Medicine

## 2020-09-28 NOTE — Telephone Encounter (Signed)
Last office visit 05/11/2020 for CPE.  Last refilled 04/03/2020 for #190 with 12 refill. No future appointments.

## 2021-01-06 MED ORDER — DOXYCYCLINE HYCLATE 100 MG PO TABS: 100.0000 mg | ORAL_TABLET | Freq: Two times a day (BID) | ORAL | 0 refills | Status: AC

## 2021-04-01 ENCOUNTER — Other Ambulatory Visit: Payer: Self-pay | Admitting: Family Medicine

## 2021-04-01 NOTE — Telephone Encounter (Signed)
Last office visit 05/11/2020 for CPE.  Last refilled 09/28/2020 for #180 with 1 refill.  No future appointments.

## 2021-04-15 ENCOUNTER — Telehealth: Payer: Self-pay | Admitting: Family Medicine

## 2021-04-15 ENCOUNTER — Other Ambulatory Visit: Payer: Self-pay | Admitting: Cardiology

## 2021-04-15 NOTE — Telephone Encounter (Signed)
Please schedule CPE with Dr. Lorelei Pont for sometime after 05/11/2021.

## 2021-04-15 NOTE — Telephone Encounter (Signed)
Called patient and got her scheduled for 10/20 @ 920

## 2021-04-15 NOTE — Telephone Encounter (Signed)
This is Dr. Schumann's pt 

## 2021-05-13 ENCOUNTER — Other Ambulatory Visit (INDEPENDENT_AMBULATORY_CARE_PROVIDER_SITE_OTHER): Payer: BC Managed Care – PPO

## 2021-05-13 ENCOUNTER — Other Ambulatory Visit: Payer: Self-pay

## 2021-05-13 ENCOUNTER — Ambulatory Visit (INDEPENDENT_AMBULATORY_CARE_PROVIDER_SITE_OTHER): Payer: BC Managed Care – PPO | Admitting: Family Medicine

## 2021-05-13 ENCOUNTER — Encounter: Payer: Self-pay | Admitting: Family Medicine

## 2021-05-13 ENCOUNTER — Other Ambulatory Visit: Payer: Self-pay | Admitting: Family Medicine

## 2021-05-13 VITALS — BP 94/70 | HR 66 | Temp 97.8°F | Ht 64.5 in | Wt 137.2 lb

## 2021-05-13 DIAGNOSIS — J9801 Acute bronchospasm: Secondary | ICD-10-CM | POA: Diagnosis not present

## 2021-05-13 DIAGNOSIS — Z0000 Encounter for general adult medical examination without abnormal findings: Secondary | ICD-10-CM

## 2021-05-13 DIAGNOSIS — R5383 Other fatigue: Secondary | ICD-10-CM | POA: Diagnosis not present

## 2021-05-13 DIAGNOSIS — Z1211 Encounter for screening for malignant neoplasm of colon: Secondary | ICD-10-CM | POA: Diagnosis not present

## 2021-05-13 DIAGNOSIS — Z79899 Other long term (current) drug therapy: Secondary | ICD-10-CM | POA: Diagnosis not present

## 2021-05-13 DIAGNOSIS — R053 Chronic cough: Secondary | ICD-10-CM | POA: Diagnosis not present

## 2021-05-13 DIAGNOSIS — Z131 Encounter for screening for diabetes mellitus: Secondary | ICD-10-CM | POA: Diagnosis not present

## 2021-05-13 DIAGNOSIS — Z13220 Encounter for screening for lipoid disorders: Secondary | ICD-10-CM

## 2021-05-13 LAB — CBC WITH DIFFERENTIAL/PLATELET
Basophils Absolute: 0.1 10*3/uL (ref 0.0–0.1)
Basophils Relative: 1.1 % (ref 0.0–3.0)
Eosinophils Absolute: 0.1 10*3/uL (ref 0.0–0.7)
Eosinophils Relative: 1.8 % (ref 0.0–5.0)
HCT: 40.4 % (ref 36.0–46.0)
Hemoglobin: 13.7 g/dL (ref 12.0–15.0)
Lymphocytes Relative: 41.2 % (ref 12.0–46.0)
Lymphs Abs: 2 10*3/uL (ref 0.7–4.0)
MCHC: 33.8 g/dL (ref 30.0–36.0)
MCV: 91.6 fl (ref 78.0–100.0)
Monocytes Absolute: 0.4 10*3/uL (ref 0.1–1.0)
Monocytes Relative: 8.9 % (ref 3.0–12.0)
Neutro Abs: 2.3 10*3/uL (ref 1.4–7.7)
Neutrophils Relative %: 47 % (ref 43.0–77.0)
Platelets: 361 10*3/uL (ref 150.0–400.0)
RBC: 4.42 Mil/uL (ref 3.87–5.11)
RDW: 13.3 % (ref 11.5–15.5)
WBC: 4.8 10*3/uL (ref 4.0–10.5)

## 2021-05-13 LAB — TSH: TSH: 3.46 u[IU]/mL (ref 0.35–5.50)

## 2021-05-13 LAB — BASIC METABOLIC PANEL
BUN: 14 mg/dL (ref 6–23)
CO2: 31 mEq/L (ref 19–32)
Calcium: 9.8 mg/dL (ref 8.4–10.5)
Chloride: 104 mEq/L (ref 96–112)
Creatinine, Ser: 0.85 mg/dL (ref 0.40–1.20)
GFR: 74.33 mL/min (ref >60.00)
Glucose, Bld: 91 mg/dL (ref 70–99)
Potassium: 4.4 mEq/L (ref 3.5–5.1)
Sodium: 141 mEq/L (ref 135–145)

## 2021-05-13 LAB — LIPID PANEL
Cholesterol: 200 mg/dL (ref 0–200)
HDL: 79.3 mg/dL (ref >39.00)
LDL Cholesterol: 109 mg/dL — ABNORMAL HIGH (ref 0–99)
NonHDL: 120.97
Total CHOL/HDL Ratio: 3
Triglycerides: 59 mg/dL (ref 0.0–149.0)
VLDL: 11.8 mg/dL (ref 0.0–40.0)

## 2021-05-13 LAB — HEPATIC FUNCTION PANEL
ALT: 17 U/L (ref 0–35)
AST: 20 U/L (ref 0–37)
Albumin: 4.3 g/dL (ref 3.5–5.2)
Alkaline Phosphatase: 57 U/L (ref 39–117)
Bilirubin, Direct: 0.1 mg/dL (ref 0.0–0.3)
Total Bilirubin: 0.5 mg/dL (ref 0.2–1.2)
Total Protein: 6.4 g/dL (ref 6.0–8.3)

## 2021-05-13 LAB — T3, FREE: T3, Free: 3.9 pg/mL (ref 2.3–4.2)

## 2021-05-13 LAB — HEMOGLOBIN A1C: Hgb A1c MFr Bld: 5.4 % (ref 4.6–6.5)

## 2021-05-13 LAB — T4, FREE: Free T4: 1.19 ng/dL (ref 0.60–1.60)

## 2021-05-13 MED ORDER — SPACER/AERO-HOLDING CHAMBERS DEVI: 0 refills | Status: AC

## 2021-05-13 NOTE — Progress Notes (Signed)
Katie Joslyn T. Ronell Boldin, MD, [place] at [facility] [place], [address]  Phone: [phone]  FAX: [phone]  Katie Todd - 60 y.o. female  MRN [id number]  Date of Birth: 01/16/1961  Date: 05/13/2021  PCP: Owens Loffler, MD  Referral: Owens Loffler, MD  Chief Complaint  Patient presents with   Annual Exam    This visit occurred during the SARS-CoV-2 public health emergency.  Safety protocols were in place, including screening questions prior to the visit, additional usage of staff PPE, and extensive cleaning of exam room while observing appropriate contact time as indicated for disinfecting solutions.   Patient Care Team: Owens Loffler, MD as PCP - General (Family Medicine) Subjective:   Katie Todd is a 60 y.o. pleasant patient who presents with the following:  Health Maintenance Summary Reviewed and updated, unless pt declines services.  Tobacco History Reviewed. Non-smoker Alcohol: No concerns, no excessive use Exercise Habits: Some activity, rec at least 30 mins 5 times a week - elliptical three or four and usually 5 days a week.  For one hour.  Drug Use: None Lumps or breast concerns: no  Mammo GYN Bivalent covid - all at [facility] All vaccines  Husband has 2 ongoing cancers.  Colon and prostate.  Feeling really tired. Exhausted all the time.  More than normal.  Does not take a nap. She does take Unisom, and it only works for a few hours, she does toss and turn.  Sleeps and in bed for about eight hour, wakes up multiple times during the night.   Spacer for an inhaler.    Health Maintenance  Topic Date Due   Pneumococcal Vaccine 89-44 Years old (1 - PCV) Never done   HIV Screening  Never done   Zoster Vaccines- Shingrix (1 of 2) Never done   MAMMOGRAM  06/01/2019   COVID-19 Vaccine (4 - Booster for Pfizer series) 07/30/2020   COLONOSCOPY (Pts 45-10yrs Insurance coverage will need to be  confirmed)  11/15/2020   INFLUENZA VACCINE  02/22/2021   PAP SMEAR-Modifier  05/15/2022   TETANUS/TDAP  09/15/2026   Hepatitis C Screening  Completed   HPV VACCINES  Aged Out    Immunization History  Administered Date(s) Administered   Influenza,inj,Quad PF,6+ Mos 05/15/2017, 06/08/2018, 04/09/2019, 03/17/2020   PFIZER(Purple Top)SARS-COV-2 Vaccination 10/02/2019, 10/31/2019, 06/04/2020   Tdap 09/15/2016   Patient Active Problem List   Diagnosis Date Noted   FHx: SVT (supraventricular tachycardia) 05/07/2020   Family history of abdominal aortic aneurysm (AAA) x 2 d/c, [place] 08/08/2018   Seasonal allergies 02/23/2013   GERD (gastroesophageal reflux disease) 02/23/2013   Anxiety state, unspecified 02/23/2013   Multiple lung nodules on CT 12/09/2005   History of chronic cough 09/10/2002    Past Medical History:  Diagnosis Date   Anxiety state, unspecified 02/23/2013   GERD (gastroesophageal reflux disease) 02/23/2013   Histoplasmosis 2006   Insomnia    PAC (premature atrial contraction)    PVC (premature ventricular contraction)    Seasonal allergies 02/23/2013    Past Surgical History:  Procedure Laterality Date   COLPOSCOPY  2010   Negative   DILATION AND CURETTAGE OF UTERUS     missed ab   NOSE SURGERY      Family History  Problem Relation Age of Onset   Breast cancer Mother 53    Past Medical History, Surgical History, Social History, Family History, Problem List, Medications, and Allergies have been reviewed and updated  if relevant.  Review of Systems: Pertinent positives are listed above.  Otherwise, a full 14 point review of systems has been done in full and it is negative except where it is noted positive.  Objective:   BP 94/70   Pulse 66   Temp 97.8 F (36.6 C) (Temporal)   Ht 5' 4.5" (1.638 m)   Wt 137 lb 4 oz (62.3 kg)   LMP 01/24/2014 Comment: october 2014 was the one prior to this July  SpO2 99%   BMI 23.20 kg/m  Ideal Body Weight: Weight  in (lb) to have BMI = 25: 147.6 No results found. Depression screen [facility] 2/9 05/13/2021 05/11/2020 08/08/2018 05/17/2017  Decreased Interest 0 0 0 0  Down, Depressed, Hopeless 0 0 0 0  PHQ - 2 Score 0 0 0 0  Altered sleeping - 3 - -  Tired, decreased energy - 3 - -  Change in appetite - 3 - -  Feeling bad or failure about yourself  - 0 - -  Trouble concentrating - 0 - -  Moving slowly or fidgety/restless - 0 - -  Suicidal thoughts - 0 - -  PHQ-9 Score - 9 - -  Difficult doing work/chores - Somewhat difficult - -     GEN: well developed, well nourished, no acute distress Eyes: conjunctiva and lids normal, PERRLA, EOMI ENT: TM clear, nares clear, oral exam WNL Neck: supple, no lymphadenopathy, no thyromegaly, no JVD Pulm: clear to auscultation and percussion, respiratory effort normal CV: regular rate and rhythm, S1-S2, no murmur, rub or gallop, no bruits Chest: no scars, masses, no lumps BREAST: breast exam declined GI: soft, non-tender; no hepatosplenomegaly, masses; active bowel sounds all quadrants GU: GU exam declined Lymph: no cervical, axillary or inguinal adenopathy MSK: gait normal, muscle tone and strength WNL, no joint swelling, effusions, discoloration, crepitus  SKIN: clear, good turgor, color WNL, no rashes, lesions, or ulcerations Neuro: normal mental status, normal strength, sensation, and motion Psych: alert; oriented to person, place and time, normally interactive and not anxious or depressed in appearance.   All labs reviewed with patient. Results for orders placed or performed in visit on 05/04/20  T4, free  Result Value Ref Range   Free T4 1.28 0.60 - 1.60 ng/dL  T3, free  Result Value Ref Range   T3, Free 5.8 (H) 2.3 - 4.2 pg/mL  TSH  Result Value Ref Range   TSH 2.94 0.35 - 4.50 uIU/mL  Basic metabolic panel  Result Value Ref Range   Sodium 140 135 - 145 mEq/L   Potassium 4.2 3.5 - 5.1 mEq/L   Chloride 103 96 - 112 mEq/L   CO2 30 19 - 32 mEq/L    Glucose, Bld 81 70 - 99 mg/dL   BUN 22 6 - 23 mg/dL   Creatinine, Ser 0.67 0.40 - 1.20 mg/dL   GFR 95.77 >60.00 mL/min   Calcium 10.1 8.4 - 10.5 mg/dL  Hepatic function panel  Result Value Ref Range   Total Bilirubin 0.3 0.2 - 1.2 mg/dL   Bilirubin, Direct 0.1 0.0 - 0.3 mg/dL   Alkaline Phosphatase 55 39 - 117 U/L   AST 21 0 - 37 U/L   ALT 17 0 - 35 U/L   Total Protein 6.4 6.0 - 8.3 g/dL   Albumin 4.2 3.5 - 5.2 g/dL  CBC with Differential/Platelet  Result Value Ref Range   WBC 6.0 4.0 - 10.5 K/uL   RBC 4.29 3.87 - 5.11 Mil/uL  Hemoglobin 13.6 12.0 - 15.0 g/dL   HCT 40.1 36.0 - 46.0 %   MCV 93.4 78.0 - 100.0 fl   MCHC 33.8 30.0 - 36.0 g/dL   RDW 14.1 11.5 - 15.5 %   Platelets 352.0 150.0 - 400.0 K/uL   Neutrophils Relative % 47.4 43.0 - 77.0 %   Lymphocytes Relative 40.9 12.0 - 46.0 %   Monocytes Relative 9.1 3.0 - 12.0 %   Eosinophils Relative 1.7 0.0 - 5.0 %   Basophils Relative 0.9 0.0 - 3.0 %   Neutro Abs 2.8 1.4 - 7.7 K/uL   Lymphs Abs 2.5 0.7 - 4.0 K/uL   Monocytes Absolute 0.5 0.1 - 1.0 K/uL   Eosinophils Absolute 0.1 0.0 - 0.7 K/uL   Basophils Absolute 0.1 0.0 - 0.1 K/uL  Hemoglobin A1c  Result Value Ref Range   Hgb A1c MFr Bld 5.5 4.6 - 6.5 %  Lipid panel  Result Value Ref Range   Cholesterol 176 0 - 200 mg/dL   Triglycerides 63.0 0.0 - 149.0 mg/dL   HDL 81.20 >39.00 mg/dL   VLDL 12.6 0.0 - 40.0 mg/dL   LDL Cholesterol 82 0 - 99 mg/dL   Total CHOL/HDL Ratio 2    NonHDL 94.52    No results found.  Assessment and Plan:     ICD-10-CM   1. Healthcare maintenance  Z00.00     2. Screening for malignant neoplasm of colon  Z12.11 Cologuard    3. Bronchospasm  J98.01 PR SPACER WITHOUT MASK     Globally, she is doing well and I do not have any major concerns.  She is going to [place] to get all of her vaccines at a local pharmacy.  It is time for her to get colon cancer screening, and we will arrange her to get Cologuard. She is also going to see  gynecology, it is time for her routine Pap management.  Health Maintenance Exam: The patient's preventative maintenance and recommended screening tests for an annual wellness exam were reviewed in full today. Brought up to date unless services declined.  Counselled on the importance of diet, exercise, and its role in overall health and mortality. The patient's FH and SH was reviewed, including their home life, tobacco status, and drug and alcohol status.  Follow-up in 1 year for physical exam or additional follow-up below.  Follow-up: No follow-ups on file. Or follow-up in 1 year if not noted.  No future appointments.  No orders of the defined types were placed in this encounter.  Medications Discontinued During This Encounter  Medication Reason   benzonatate (TESSALON PERLES) 100 MG capsule Completed Course   Orders Placed This Encounter  Procedures   Cologuard   PR SPACER WITHOUT MASK    Signed,  Akoni Parton T. Kathlyn Leachman, MD   Allergies as of 05/13/2021       Reactions   Penicillins Anaphylaxis        Medication List        Accurate as of May 13, 2021 10:37 AM. If you have any questions, ask your nurse or doctor.          STOP taking these medications    benzonatate 100 MG capsule Commonly known as: Best boy Stopped by: Owens Loffler, MD       TAKE these medications    albuterol 108 (90 Base) MCG/ACT inhaler Commonly known as: VENTOLIN HFA INHALE 2 PUFFS INTO THE LUNGS EVERY 4 HOURS AS NEEDED FOR WHEEZE What changed: See the  new instructions. Changed by: Owens Loffler, MD   aspirin 81 MG chewable tablet Chew 81 mg by mouth daily.   Biotin 5000 MCG Tabs Take 2 tablets by mouth daily.   calcium carbonate 1250 MG capsule Take 1,250 mg by mouth 2 (two) times daily with a meal.   cetirizine 10 MG tablet Commonly known as: ZYRTEC Take 10 mg by mouth daily.   diclofenac 75 MG EC tablet Commonly known as: VOLTAREN TAKE 1 TABLET BY  MOUTH TWICE A DAY   diphenhydrAMINE 25 mg capsule Commonly known as: BENADRYL Take 50 mg by mouth every 6 (six) hours as needed.   docusate sodium 100 MG capsule Commonly known as: COLACE Take 200 mg by mouth 2 (two) times daily.   doxylamine (Sleep) 25 MG tablet Commonly known as: UNISOM Take 25 mg by mouth at bedtime as needed.   Flax Seed Oil 1300 MG Caps   fluticasone 50 MCG/ACT nasal spray Commonly known as: FLONASE SPRAY 2 SPRAYS IN EACH NOSTRIL NIGHTLY   Magnesium 400 MG Tabs Take 1 tablet by mouth 2 (two) times daily.   metoprolol succinate 25 MG 24 hr tablet Commonly known as: TOPROL-XL TAKE 1 TABLET BY MOUTH EVERY DAY   montelukast 10 MG tablet Commonly known as: SINGULAIR TAKE 1 TABLET BY MOUTH EVERYDAY AT BEDTIME   multivitamin tablet Take 1 tablet by mouth daily.   pseudoephedrine 30 MG tablet Commonly known as: SUDAFED   ROBITUSSIN DM PO

## 2021-05-13 NOTE — Addendum Note (Signed)
Addended by: Carter Kitten on: 05/13/2021 04:42 PM   Modules accepted: Orders

## 2021-05-21 NOTE — Progress Notes (Signed)
Katie Todd,   This was a DME order - not given in office.  (Spacer)

## 2021-05-31 ENCOUNTER — Telehealth: Payer: Self-pay | Admitting: *Deleted

## 2021-05-31 NOTE — Telephone Encounter (Signed)
Called patient to follow-up on an appointment that she scheduled with Dr. Lorelei Pont 06/02/21 at 11:20 am. Patient stated that she has been sick for about a week and she did a home covid test today and it was negative. Patient stated that she has had some SOB off and on for about a week and felt really bad last night. Patient denies a fever but has had chills. Patient stated that she has a history of lung nodules . Patient stated that she has an inhaler that she uses for her SOB, Patient stated that she is feeling some better today. Patient stated that her oxygent level has been 98%. Patient stated that she is at a point where she thinks that she may need an antibiotic. 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  Patient stated that she knows to go to the ER if her symptoms get worse. Patient stated that she scheduled the appointment for Wednesday and if she continues to feel better she is going to call and cancel the appointment.  Patient started that her chest hurts from her cough and it is not heart related. ER precautions again reviewed with patient and she verbalized understanding.

## 2021-06-02 ENCOUNTER — Other Ambulatory Visit: Payer: Self-pay

## 2021-06-02 ENCOUNTER — Ambulatory Visit (INDEPENDENT_AMBULATORY_CARE_PROVIDER_SITE_OTHER): Payer: BC Managed Care – PPO | Admitting: Family Medicine

## 2021-06-02 ENCOUNTER — Ambulatory Visit (INDEPENDENT_AMBULATORY_CARE_PROVIDER_SITE_OTHER)
Admission: RE | Admit: 2021-06-02 | Discharge: 2021-06-02 | Disposition: A | Discharge status: Home / Self Care | Payer: BC Managed Care – PPO | Source: Ambulatory Visit | Attending: Family Medicine | Admitting: Family Medicine

## 2021-06-02 VITALS — BP 100/74 | HR 76 | Temp 97.3°F | Ht 64.5 in | Wt 142.4 lb

## 2021-06-02 DIAGNOSIS — J189 Pneumonia, unspecified organism: Secondary | ICD-10-CM

## 2021-06-02 DIAGNOSIS — R059 Cough, unspecified: Secondary | ICD-10-CM | POA: Diagnosis not present

## 2021-06-02 DIAGNOSIS — R052 Subacute cough: Secondary | ICD-10-CM

## 2021-06-02 MED ORDER — DOXYCYCLINE HYCLATE 100 MG PO TABS: 100.0000 mg | ORAL_TABLET | Freq: Two times a day (BID) | ORAL | 0 refills | Status: DC

## 2021-06-02 NOTE — Progress Notes (Signed)
Katie Todd T. Cavin Longman, MD, [place] at [facility] [place], [address]  Phone: [phone]  FAX: [phone]  Katie Todd - 60 y.o. female  MRN [id number]  Date of Birth: 1961-06-23  Date: 06/02/2021  PCP: Owens Loffler, MD  Referral: Owens Loffler, MD  Chief Complaint  Patient presents with   Cough    Negative Covid Test on Monday   Chills    This visit occurred during the SARS-CoV-2 public health emergency.  Safety protocols were in place, including screening questions prior to the visit, additional usage of staff PPE, and extensive cleaning of exam room while observing appropriate contact time as indicated for disinfecting solutions.   Subjective:   Katie Todd is a 60 y.o. very pleasant female patient with Body mass index is 24.06 kg/m. 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  She has been sick for about 2 to 3 weeks, and she got sick the week after her health maintenance exam.  She has continued to have some shortness of breath with exertion.  She is also had persisting cough.  Does feel like she is gotten worse now throughout the day.  She does think she might be a little bit better than her initial onset of symptoms.  Multiple COVID-19 tests have been negative.  Her symptoms are such that she is not able to exercise like her normal pattern.  She has been having some chills, but she has not had any kind of systemic fever.  Flushed sinuses, pepto.  All manner of outpatient conservative care has not been successful at this point.  Pain in chest from coughing. Aspirated about a week ago.  Very bad.   OTC many meds.   Review of Systems is noted in the HPI, as appropriate  Objective:   BP 100/74   Pulse 76   Temp (!) 97.3 F (36.3 C) (Temporal)   Ht 5' 4.5" (1.638 m)   Wt 142 lb 6 oz (64.6 kg)   LMP 01/24/2014 Comment: october 2014 was the one prior to  this July  SpO2 98%   BMI 24.06 kg/m   GEN: No acute distress; alert,appropriate. PULM: Breathing comfortably in no respiratory distress PSYCH: Normally interactive.  CV: RRR, no m/g/r  PULM: Normal respiratory rate, no accessory muscle use. No wheezes, crackles or rhonchi   Laboratory and Imaging Data: DG Chest 2 View  Result Date: 06/03/2021 CLINICAL DATA:  Prolonged cough for 2-3 weeks. EXAM: CHEST - 2 VIEW COMPARISON:  X-ray chest 05/01/2019. FINDINGS: The heart size and mediastinal contours are within normal limits. Scattered calcified granulomas, unchanged. No focal airspace consolidation, pleural effusion, or pneumothorax. The visualized skeletal structures are unremarkable. IMPRESSION: No active cardiopulmonary disease. Electronically Signed   By: Davina Poke D.O.   On: 06/03/2021 08:13     Assessment and Plan:     ICD-10-CM   1. Subacute cough  R05.2 DG Chest 2 View    2. Atypical pneumonia  J18.9 DG Chest 2 View     Highest likelihood on differential is atypical pneumonia.  Pertussis cannot be excluded.  Resolving influenza also possible.  We will check a chest x-ray also to rule out an obvious aspiration pneumonia.  CXR appears normal.  Continue treatment for atypical pneumonia.   Meds ordered this encounter  Medications   doxycycline (VIBRA-TABS) 100 MG tablet    Sig: Take 1 tablet (100 mg total)  by mouth 2 (two) times daily.    Dispense:  20 tablet    Refill:  0   There are no discontinued medications. Orders Placed This Encounter  Procedures   DG Chest 2 View    Follow-up: No follow-ups on file.  Dragon Medical One speech-to-text software was used for transcription in this dictation.  Possible transcriptional errors can occur using Editor, commissioning.   Signed,  Maud Deed. Takai Chiaramonte, MD   Outpatient Encounter Medications as of 06/02/2021  Medication Sig   albuterol (VENTOLIN HFA) 108 (90 Base) MCG/ACT inhaler INHALE 2 PUFFS INTO THE LUNGS EVERY 4  HOURS AS NEEDED FOR WHEEZE   aspirin 81 MG chewable tablet Chew 81 mg by mouth daily.   Biotin 5000 MCG TABS Take 2 tablets by mouth daily.   calcium carbonate 1250 MG capsule Take 1,250 mg by mouth 2 (two) times daily with a meal.   cetirizine (ZYRTEC) 10 MG tablet Take 10 mg by mouth daily.   Dextromethorphan-guaiFENesin (ROBITUSSIN DM PO)    diclofenac (VOLTAREN) 75 MG EC tablet TAKE 1 TABLET BY MOUTH TWICE A DAY   diphenhydrAMINE (BENADRYL) 25 mg capsule Take 50 mg by mouth every 6 (six) hours as needed.    docusate sodium (COLACE) 100 MG capsule Take 200 mg by mouth 2 (two) times daily.   doxycycline (VIBRA-TABS) 100 MG tablet Take 1 tablet (100 mg total) by mouth 2 (two) times daily.   doxylamine, Sleep, (UNISOM) 25 MG tablet Take 25 mg by mouth at bedtime as needed.    famotidine (PEPCID) 20 MG tablet Take 20 mg by mouth daily.   Flaxseed, Linseed, (FLAX SEED OIL) 1300 MG CAPS    fluticasone (FLONASE) 50 MCG/ACT nasal spray SPRAY 2 SPRAYS IN EACH NOSTRIL NIGHTLY   Magnesium 400 MG TABS Take 1 tablet by mouth 2 (two) times daily.   metoprolol succinate (TOPROL-XL) 25 MG 24 hr tablet TAKE 1 TABLET BY MOUTH EVERY DAY   montelukast (SINGULAIR) 10 MG tablet TAKE 1 TABLET BY MOUTH EVERYDAY AT BEDTIME   Multiple Vitamin (MULTIVITAMIN) tablet Take 1 tablet by mouth daily.   pseudoephedrine (SUDAFED) 30 MG tablet    Spacer/Aero-Holding Chambers DEVI Use with Albuterol inhaler   No facility-administered encounter medications on file as of 06/02/2021.

## 2021-06-03 ENCOUNTER — Encounter: Payer: Self-pay | Admitting: Family Medicine

## 2021-08-19 ENCOUNTER — Other Ambulatory Visit: Payer: Self-pay

## 2021-08-19 ENCOUNTER — Ambulatory Visit (INDEPENDENT_AMBULATORY_CARE_PROVIDER_SITE_OTHER): Payer: BC Managed Care – PPO | Admitting: Family Medicine

## 2021-08-19 ENCOUNTER — Encounter: Payer: Self-pay | Admitting: Family Medicine

## 2021-08-19 VITALS — BP 130/90 | HR 123 | Temp 98.3°F | Ht 64.5 in | Wt 148.2 lb

## 2021-08-19 DIAGNOSIS — R0602 Shortness of breath: Secondary | ICD-10-CM | POA: Diagnosis not present

## 2021-08-19 DIAGNOSIS — Z8619 Personal history of other infectious and parasitic diseases: Secondary | ICD-10-CM

## 2021-08-19 DIAGNOSIS — Z87891 Personal history of nicotine dependence: Secondary | ICD-10-CM

## 2021-08-19 DIAGNOSIS — R053 Chronic cough: Secondary | ICD-10-CM

## 2021-08-19 MED ORDER — DOXYCYCLINE HYCLATE 100 MG PO TABS: 100.0000 mg | ORAL_TABLET | Freq: Two times a day (BID) | ORAL | 0 refills | Status: DC

## 2021-08-19 NOTE — Progress Notes (Addendum)
Katie Hoefling T. Cellie Dardis, MD, [place] at [facility] [place], [address]  Phone: [phone]   FAX: [phone]  Katie Todd - 61 y.o. female   MRN [id number]   Date of Birth: 05/28/1961  Date: 08/19/2021   PCP: Owens Loffler, MD   Referral: Owens Loffler, MD  Chief Complaint  Patient presents with   Cough    Chronic x 16 years-Getting worse as she ages    This visit occurred during the SARS-CoV-2 public health emergency.  Safety protocols were in place, including screening questions prior to the visit, additional usage of staff PPE, and extensive cleaning of exam room while observing appropriate contact time as indicated for disinfecting solutions.   Subjective:   Katie Todd is a 61 y.o. very pleasant female patient with Body mass index is 25.05 kg/m. who presents with the following:  Chronic cough:  Historically found to have histoplasmosis in [place]. In the past, she has had very large-scale work-ups by pulmonology, ENT, as well as gastroenterology, but this was approaching 10 years ago or more when she was living in [place].  Right now she is quite upset, and this has been worsening over the last 16 years.  She will have good days or bad days, but right now she is coughing daily and this is having a significant, dramatic impact on her quality of life.  It is inhibiting her her ability to exercise, and she is normally quite active.  I did see her in June 02, 2021, at that point I did put her on some oral doxycycline for presumed atypical pneumonia.  She did have a normal chest x-ray at that time.  I did look at her coronary calcium score CT.  There are some small calcified granulomas that they visualized.  No concern for potential neoplastic disease.  No effusion.  The older she gets, it is becoming harder and harder.  Now retied essentially from her coughing.   Now SOB and then it went away, then everything  went competely away.   Bad days all of the time and will get really hungry. Will choke sometimes. Also trying an inhaler.  Will sit there  Some day shave been really bad.  When starts it will stop.   2015 normal spirometry.  Hands will be cold sometimes.  ABI normal, 2022 2022 echo normal 2021, coronary calcium score  15 years smoked, last in 1990  Review of Systems is noted in the HPI, as appropriate  Patient Active Problem List   Diagnosis Date Noted   FHx: SVT (supraventricular tachycardia) 05/07/2020   Family history of abdominal aortic aneurysm (AAA) x 2 d/c, [place] 08/08/2018   Seasonal allergies 02/23/2013   GERD (gastroesophageal reflux disease) 02/23/2013   Anxiety state, unspecified 02/23/2013   Multiple lung nodules on CT 12/09/2005   History of chronic cough 09/10/2002    Past Medical History:  Diagnosis Date   Anxiety state, unspecified 02/23/2013   GERD (gastroesophageal reflux disease) 02/23/2013   Histoplasmosis 2006   Insomnia    PAC (premature atrial contraction)    PVC (premature ventricular contraction)    Seasonal allergies 02/23/2013    Past Surgical History:  Procedure Laterality Date   COLPOSCOPY  2010   Negative   DILATION AND CURETTAGE OF UTERUS     missed ab   NOSE SURGERY      Family History  Problem Relation Age of Onset   Breast cancer Mother 89  Objective:   BP 130/90    Pulse (!) 123    Temp 98.3 F (36.8 C) (Temporal)    Ht 5' 4.5" (1.638 m)    Wt 148 lb 4 oz (67.2 kg)    LMP 01/24/2014 Comment: october 2014 was the one prior to this July   SpO2 100%    BMI 25.05 kg/m   GEN: No acute distress; alert,appropriate. PULM: Breathing comfortably in no respiratory distress PSYCH: Normally interactive.  CV: RRR, no m/g/r  PULM: Normal respiratory rate, no accessory muscle use. No wheezes, crackles or rhonchi   Laboratory and Imaging Data: EXAM: OVER-READ INTERPRETATION  CT CHEST   The following report is an over-read  performed by radiologist Dr. Vinnie Langton of [facility], [place] on 01/07/2020. This over-read does not include interpretation of cardiac or coronary anatomy or pathology. The coronary calcium score interpretation by the cardiologist is attached.   COMPARISON:  None.   FINDINGS: Several small calcified granulomas are noted in the left lower lobe. Multiple densely calcified left hilar lymph nodes are incidentally noted. Within the visualized portions of the thorax there are no other suspicious appearing pulmonary nodules or masses, there is no acute consolidative airspace disease, no pleural effusions, no pneumothorax and no lymphadenopathy. Visualized portions of the upper abdomen are unremarkable. There are no aggressive appearing lytic or blastic lesions noted in the visualized portions of the skeleton.   IMPRESSION: 1. Old granulomatous disease, as above.   Electronically Signed: By: Vinnie Langton M.D. On: 01/07/2020 11:32  Assessment and Plan:     ICD-10-CM   1. Chronic cough  R05.3 CT Chest W Contrast    Ambulatory referral to Pulmonology    2. History of histoplasmosis  Z86.19 CT Chest W Contrast    Ambulatory referral to Pulmonology    3. History of smoking 10-25 pack years  Z87.891 CT Chest W Contrast    Ambulatory referral to Pulmonology    4. SOB (shortness of breath)  R06.02 CT Chest W Contrast    Ambulatory referral to Pulmonology     Patient has had a progressive cough that is now bothering her almost on a daily basis.  She was a remote smoker.  She has had some fairly recent chest x-ray and scan without evidence for concern for neoplasm.  She is also getting some occasional shortness of breath now.  On review of her coronary calcium score, which did have a score of 0, on the secondary reading of the chest, there were several small calcified granulomas in the left lower lobe.  She was diagnosed with histoplasmosis years ago.  Regardless, with  progressive coughing and no clear source, prior granulomatous disease, obtain a CT of the chest with contrast.  Regardless, I think that she needs to have additional pulmonary logical evaluation.  I appreciate their assistance.  We can feel pretty good that this is not her heart.  Coronary calcium score is 0.  Echocardiogram is normal.  Addendum: 09/01/21 2:24 PM   CT has returned.  Multiple nodules.  Multiple calcified nodules.  Probable prior granulomatous disease.  Chronic cough greater than 10 years.  Worsening chronic cough.  Fairly debilitating for the patient.  I would like to get a formal pulmonary consultation. Electronically Signed  By: Owens Loffler, MD On: 09/01/2021 2:24 PM   CT Chest W Contrast  Result Date: 09/01/2021 CLINICAL DATA:  Chronic cough. History of histoplasmosis. History of smoking 10-25 pack years. Shortness of breath. Cough, chronic/persisting greater than  8 weeks, field empiric treatment. History of histoplasmosis and no high years ago. EXAM: CT CHEST WITH CONTRAST TECHNIQUE: Multidetector CT imaging of the chest was performed during intravenous contrast administration. RADIATION DOSE REDUCTION: This exam was performed according to the departmental dose-optimization program which includes automated exposure control, adjustment of the mA and/or kV according to patient size and/or use of iterative reconstruction technique. CONTRAST:  21mL ISOVUE-300 IOPAMIDOL (ISOVUE-300) INJECTION 61% COMPARISON:  Chest radiograph 06/02/2021. Included portions from cardiac CT 01/07/2020 FINDINGS: Cardiovascular: The thoracic aorta is normal in caliber. Conventional branching pattern from the aortic arch. The heart is normal in size. No pericardial effusion. Mediastinum/Nodes: Calcified left hilar lymph nodes consistent with prior granulomatous disease. There is no noncalcified adenopathy. No visualized thyroid nodule. The distal esophagus is patulous, no significant wall thickening.  Lungs/Pleura: Minimal biapical pleuroparenchymal scarring. Again seen calcified nodules in the left lower lobe consistent with prior granulomatous disease. There is several noncalcified nodules in both lungs. Right upper lobe nodules measuring 3 mm on series 3, image 20 and image 28. perifissural right lower lobe nodule measuring 3 mm series 3, image 80. 2 mm left lower lobe nodule series 3, image 105. 4 mm nodule in the lower right lower lobe series 3, image 139. No confluent consolidation or dominant pulmonary mass. The trachea and central bronchi are patent without bronchial wall thickening. No pleural fluid. No findings of interstitial lung disease. Upper Abdomen: No acute or unexpected findings. Incidental splenule anterior to the spleen, benign. Musculoskeletal: There are no acute or suspicious osseous abnormalities. No chest wall soft tissue abnormalities. IMPRESSION: 1. No acute findings or explanation for cough. 2. Sequela of prior granulomatous disease with calcified left hilar lymph nodes and left lower lobe pulmonary nodules. 3. There are also a few noncalcified pulmonary nodules in both lungs, largest measuring up to 4 mm. This may also be related to prior granulomatous disease. No follow-up needed if patient is low-risk (and has no known or suspected primary neoplasm). Non-contrast chest CT can be considered in 12 months if patient is high-risk. This recommendation follows the consensus statement: Guidelines for Management of Incidental Pulmonary Nodules Detected on CT Images: From the Fleischner Society 2017; Radiology 2017; 284:228-243. Electronically Signed   By: Keith Rake M.D.   On: 09/01/2021 12:13     Meds ordered this encounter  Medications   doxycycline (VIBRA-TABS) 100 MG tablet    Sig: Take 1 tablet (100 mg total) by mouth 2 (two) times daily.    Dispense:  28 tablet    Refill:  0   Medications Discontinued During This Encounter  Medication Reason   famotidine (PEPCID) 20 MG  tablet Completed Course   Flaxseed, Linseed, (FLAX SEED OIL) 1300 MG CAPS Completed Course   doxycycline (VIBRA-TABS) 100 MG tablet Completed Course   pseudoephedrine (SUDAFED) 30 MG tablet Change in therapy   Orders Placed This Encounter  Procedures   CT Chest W Contrast   Ambulatory referral to Pulmonology    Follow-up: No follow-ups on file.  Dragon [employer] One speech-to-text software was used for transcription in this dictation.  Possible transcriptional errors can occur using Editor, commissioning.   Signed,  Maud Deed. Michela Herst, MD   Outpatient Encounter Medications as of 08/19/2021  Medication Sig   albuterol (VENTOLIN HFA) 108 (90 Base) MCG/ACT inhaler INHALE 2 PUFFS INTO THE LUNGS EVERY 4 HOURS AS NEEDED FOR WHEEZE   aspirin 81 MG chewable tablet Chew 81 mg by mouth daily.   Biotin 5000 MCG  TABS Take 2 tablets by mouth daily.   calcium carbonate 1250 MG capsule Take 1,250 mg by mouth 2 (two) times daily with a meal.   cetirizine (ZYRTEC) 10 MG tablet Take 10 mg by mouth daily.   Dextromethorphan-guaiFENesin (ROBITUSSIN DM PO)    diclofenac (VOLTAREN) 75 MG EC tablet TAKE 1 TABLET BY MOUTH TWICE A DAY   diphenhydrAMINE (BENADRYL) 25 mg capsule Take 50 mg by mouth every 6 (six) hours as needed.    docusate sodium (COLACE) 100 MG capsule Take 200 mg by mouth 2 (two) times daily.   doxycycline (VIBRA-TABS) 100 MG tablet Take 1 tablet (100 mg total) by mouth 2 (two) times daily.   doxylamine, Sleep, (UNISOM) 25 MG tablet Take 25 mg by mouth at bedtime as needed.    famotidine (PEPCID) 20 MG tablet Take 40 mg by mouth daily.   fluticasone (FLONASE) 50 MCG/ACT nasal spray SPRAY 2 SPRAYS IN EACH NOSTRIL NIGHTLY   Guaifenesin (MUCINEX MAXIMUM STRENGTH) 1200 MG TB12 Take 1 tablet by mouth in the morning and at bedtime.   Magnesium 400 MG TABS Take 1 tablet by mouth 2 (two) times daily.   metoprolol succinate (TOPROL-XL) 25 MG 24 hr tablet TAKE 1 TABLET BY MOUTH EVERY DAY   montelukast  (SINGULAIR) 10 MG tablet TAKE 1 TABLET BY MOUTH EVERYDAY AT BEDTIME   Multiple Vitamin (MULTIVITAMIN) tablet Take 1 tablet by mouth daily.   pseudoephedrine (SUDAFED 12 HOUR) 120 MG 12 hr tablet Take 120 mg by mouth daily.   Spacer/Aero-Holding Dorise Bullion Use with Albuterol inhaler   [DISCONTINUED] doxycycline (VIBRA-TABS) 100 MG tablet Take 1 tablet (100 mg total) by mouth 2 (two) times daily.   [DISCONTINUED] famotidine (PEPCID) 20 MG tablet Take 20 mg by mouth daily.   [DISCONTINUED] Flaxseed, Linseed, (FLAX SEED OIL) 1300 MG CAPS    [DISCONTINUED] pseudoephedrine (SUDAFED) 30 MG tablet    No facility-administered encounter medications on file as of 08/19/2021.

## 2021-08-31 ENCOUNTER — Ambulatory Visit
Admission: RE | Admit: 2021-08-31 | Discharge: 2021-08-31 | Disposition: A | Discharge status: Home / Self Care | Payer: BC Managed Care – PPO | Source: Ambulatory Visit | Attending: Family Medicine | Admitting: Family Medicine

## 2021-08-31 DIAGNOSIS — Z87891 Personal history of nicotine dependence: Secondary | ICD-10-CM

## 2021-08-31 DIAGNOSIS — D71 Functional disorders of polymorphonuclear neutrophils: Secondary | ICD-10-CM | POA: Diagnosis not present

## 2021-08-31 DIAGNOSIS — B399 Histoplasmosis, unspecified: Secondary | ICD-10-CM | POA: Diagnosis not present

## 2021-08-31 DIAGNOSIS — I898 Other specified noninfective disorders of lymphatic vessels and lymph nodes: Secondary | ICD-10-CM | POA: Diagnosis not present

## 2021-08-31 DIAGNOSIS — J984 Other disorders of lung: Secondary | ICD-10-CM | POA: Diagnosis not present

## 2021-08-31 DIAGNOSIS — Z8619 Personal history of other infectious and parasitic diseases: Secondary | ICD-10-CM

## 2021-08-31 DIAGNOSIS — R0602 Shortness of breath: Secondary | ICD-10-CM

## 2021-08-31 DIAGNOSIS — R053 Chronic cough: Secondary | ICD-10-CM

## 2021-08-31 MED ORDER — IOPAMIDOL (ISOVUE-300) INJECTION 61%
75.0000 mL | Freq: Once | INTRAVENOUS | Status: AC | PRN
  Administered 2021-08-31: 75 mL via INTRAVENOUS

## 2021-09-01 ENCOUNTER — Encounter: Payer: Self-pay | Admitting: Family Medicine

## 2021-09-01 NOTE — Addendum Note (Signed)
Addended by: Owens Loffler on: 09/01/2021 02:24 PM   Modules accepted: Orders

## 2021-09-10 ENCOUNTER — Encounter: Payer: Self-pay | Admitting: Family Medicine

## 2021-09-26 ENCOUNTER — Other Ambulatory Visit: Payer: Self-pay

## 2021-09-26 ENCOUNTER — Telehealth: Payer: Self-pay

## 2021-09-26 ENCOUNTER — Ambulatory Visit
Admission: EM | Admit: 2021-09-26 | Discharge: 2021-09-26 | Disposition: A | Discharge status: Home / Self Care | Payer: BC Managed Care – PPO | Attending: Urgent Care | Admitting: Urgent Care

## 2021-09-26 DIAGNOSIS — R0989 Other specified symptoms and signs involving the circulatory and respiratory systems: Secondary | ICD-10-CM

## 2021-09-26 DIAGNOSIS — R070 Pain in throat: Secondary | ICD-10-CM | POA: Diagnosis not present

## 2021-09-26 DIAGNOSIS — R0981 Nasal congestion: Secondary | ICD-10-CM

## 2021-09-26 DIAGNOSIS — U071 COVID-19: Secondary | ICD-10-CM | POA: Diagnosis not present

## 2021-09-26 DIAGNOSIS — R052 Subacute cough: Secondary | ICD-10-CM

## 2021-09-26 MED ORDER — PROMETHAZINE-DM 6.25-15 MG/5ML PO SYRP: 5.0000 mL | ORAL_SOLUTION | Freq: Four times a day (QID) | ORAL | 0 refills | Status: DC | PRN

## 2021-09-26 MED ORDER — PAXLOVID (300/100) 20 X 150 MG & 10 X 100MG PO TBPK: 1.0000 | ORAL_TABLET | Freq: Two times a day (BID) | ORAL | 0 refills | Status: DC

## 2021-09-26 MED ORDER — PAXLOVID (300/100) 20 X 150 MG & 10 X 100MG PO TBPK: ORAL_TABLET | ORAL | 0 refills | Status: DC

## 2021-09-26 NOTE — ED Provider Notes (Signed)
?[place] ? ? ?MRN: [id number] DOB: 1960-09-14 ? ?Subjective:  ? ?Katie Todd is a 61 y.o. female presenting for 1 day history of sinus congestion, throat pain and chest congestion.  Patient has a chronic cough, and is supposed to see a pulmonologist soon.  Has been taking care of her husband who tested positive for COVID.  She ran a test and was positive at home too.  Does not want a repeat COVID confirmation test.  Would like to get Paxlovid.  No chest pain, shortness of breath or wheezing.  She does have an albuterol inhaler.  Has not had to use steroids for a long time.  She does take allergy medications. ? ?No current facility-administered medications for this encounter. ? ?Current Outpatient Medications:  ?  albuterol (VENTOLIN HFA) 108 (90 Base) MCG/ACT inhaler, INHALE 2 PUFFS INTO THE LUNGS EVERY 4 HOURS AS NEEDED FOR WHEEZE, Disp: 8.5 each, Rfl: 2 ?  aspirin 81 MG chewable tablet, Chew 81 mg by mouth daily., Disp: , Rfl:  ?  Biotin 5000 MCG TABS, Take 2 tablets by mouth daily., Disp: , Rfl:  ?  calcium carbonate 1250 MG capsule, Take 1,250 mg by mouth 2 (two) times daily with a meal., Disp: , Rfl:  ?  cetirizine (ZYRTEC) 10 MG tablet, Take 10 mg by mouth daily., Disp: , Rfl:  ?  Dextromethorphan-guaiFENesin (ROBITUSSIN DM PO), , Disp: , Rfl:  ?  diclofenac (VOLTAREN) 75 MG EC tablet, TAKE 1 TABLET BY MOUTH TWICE A DAY, Disp: 180 tablet, Rfl: 1 ?  diphenhydrAMINE (BENADRYL) 25 mg capsule, Take 50 mg by mouth every 6 (six) hours as needed. , Disp: , Rfl:  ?  docusate sodium (COLACE) 100 MG capsule, Take 200 mg by mouth 2 (two) times daily., Disp: , Rfl:  ?  doxycycline (VIBRA-TABS) 100 MG tablet, Take 1 tablet (100 mg total) by mouth 2 (two) times daily., Disp: 28 tablet, Rfl: 0 ?  doxylamine, Sleep, (UNISOM) 25 MG tablet, Take 25 mg by mouth at bedtime as needed. , Disp: , Rfl:  ?  famotidine (PEPCID) 20 MG tablet, Take 40 mg by mouth daily., Disp: , Rfl:  ?  fluticasone (FLONASE) 50  MCG/ACT nasal spray, SPRAY 2 SPRAYS IN EACH NOSTRIL NIGHTLY, Disp: , Rfl: 11 ?  Guaifenesin (MUCINEX MAXIMUM STRENGTH) 1200 MG TB12, Take 1 tablet by mouth in the morning and at bedtime., Disp: , Rfl:  ?  Magnesium 400 MG TABS, Take 1 tablet by mouth 2 (two) times daily., Disp: , Rfl:  ?  metoprolol succinate (TOPROL-XL) 25 MG 24 hr tablet, TAKE 1 TABLET BY MOUTH EVERY DAY, Disp: 90 tablet, Rfl: 3 ?  montelukast (SINGULAIR) 10 MG tablet, TAKE 1 TABLET BY MOUTH EVERYDAY AT BEDTIME, Disp: 90 tablet, Rfl: 3 ?  Multiple Vitamin (MULTIVITAMIN) tablet, Take 1 tablet by mouth daily., Disp: , Rfl:  ?  pseudoephedrine (SUDAFED 12 HOUR) 120 MG 12 hr tablet, Take 120 mg by mouth daily., Disp: , Rfl:  ?  Spacer/Aero-Holding Chambers DEVI, Use with Albuterol inhaler, Disp: 1 each, Rfl: 0  ? ?Allergies  ?Allergen Reactions  ? Penicillins Anaphylaxis  ? ? ?Past Medical History:  ?Diagnosis Date  ? Anxiety state, unspecified 02/23/2013  ? GERD (gastroesophageal reflux disease) 02/23/2013  ? Histoplasmosis 2006  ? Insomnia   ? PAC (premature atrial contraction)   ? PVC (premature ventricular contraction)   ? Seasonal allergies 02/23/2013  ?  ? ?Past Surgical History:  ?Procedure Laterality Date  ?  COLPOSCOPY  2010  ? Negative  ? DILATION AND CURETTAGE OF UTERUS    ? missed ab  ? NOSE SURGERY    ? ? ?Family History  ?Problem Relation Age of Onset  ? Breast cancer Mother 44  ? ? ?Social History  ? ?Tobacco Use  ? Smoking status: Former  ?  Types: Cigarettes  ?  Quit date: 02/20/1990  ?  Years since quitting: 31.6  ? Smokeless tobacco: Never  ?Vaping Use  ? Vaping Use: Never used  ?Substance Use Topics  ? Alcohol use: Yes  ?  Alcohol/week: 0.0 standard drinks  ?  Comment: rarely  ? Drug use: No  ? ? ?ROS ? ? ?Objective:  ? ?Vitals: ?BP 125/80 (BP Location: Right Arm)   Pulse 94   Temp 98.9 ?F (37.2 ?C) (Oral)   Resp 18   LMP 01/24/2014 Comment: october 2014 was the one prior to this July  SpO2 96%  ? ?Physical Exam ?Constitutional:    ?   General: She is not in acute distress. ?   Appearance: Normal appearance. She is well-developed. She is not ill-appearing, toxic-appearing or diaphoretic.  ?HENT:  ?   Head: Normocephalic and atraumatic.  ?   Nose: Nose normal.  ?   Mouth/Throat:  ?   Mouth: Mucous membranes are moist.  ?   Comments: Postnasal drainage overlying pharynx. ?Eyes:  ?   General: No scleral icterus.    ?   Right eye: No discharge.     ?   Left eye: No discharge.  ?   Extraocular Movements: Extraocular movements intact.  ?Cardiovascular:  ?   Rate and Rhythm: Normal rate.  ?   Heart sounds: No murmur heard. ?  No friction rub. No gallop.  ?Pulmonary:  ?   Effort: Pulmonary effort is normal. No respiratory distress.  ?   Breath sounds: No stridor. No wheezing, rhonchi or rales.  ?Chest:  ?   Chest wall: No tenderness.  ?Skin: ?   General: Skin is warm and dry.  ?Neurological:  ?   General: No focal deficit present.  ?   Mental Status: She is alert and oriented to person, place, and time.  ?Psychiatric:     ?   Mood and Affect: Mood normal.     ?   Behavior: Behavior normal.  ? ? ?Assessment and Plan :  ? ?PDMP not reviewed this encounter. ? ?1. Clinical diagnosis of COVID-19   ?2. Chest congestion   ?3. Subacute cough   ?4. Throat pain   ?5. Sinus congestion   ? ?Recommended Paxlovid for clinical diagnosis of COVID-19.  Use supportive care otherwise.  Continue with follow-up with the pulmonologist. Deferred imaging given clear cardiopulmonary exam, hemodynamically stable vital signs. Counseled patient on potential for adverse effects with medications prescribed/recommended today, ER and return-to-clinic precautions discussed, patient verbalized understanding. ? ?  ?Jaynee Eagles, PA-C ?09/26/21 1352 ? ?

## 2021-09-26 NOTE — Telephone Encounter (Signed)
Prescription resent to [facility] in [place]. ?

## 2021-09-26 NOTE — ED Triage Notes (Signed)
Pt reports sore throat, nasal congestion x 1 day; chest congestion started today. Pt reports se is taking care of her husband who tested positive for COVID 3 days ago. Pt had a + COVID home test today.  ?

## 2021-10-01 ENCOUNTER — Other Ambulatory Visit: Payer: Self-pay | Admitting: Family Medicine

## 2021-10-01 NOTE — Telephone Encounter (Signed)
Last office visit 08/19/21 for chronic cough.  Last refilled 04/01/21 for #180 with 1 refill.  No future appointments with PCP.  ?

## 2021-10-05 ENCOUNTER — Institutional Professional Consult (permissible substitution): Payer: BC Managed Care – PPO | Admitting: Pulmonary Disease

## 2021-10-05 ENCOUNTER — Encounter: Payer: Self-pay | Admitting: Family Medicine

## 2021-10-05 NOTE — Telephone Encounter (Signed)
Vaccine record updated.

## 2021-10-08 ENCOUNTER — Encounter: Payer: Self-pay | Admitting: Family Medicine

## 2021-10-08 MED ORDER — PREDNISONE 20 MG PO TABS
ORAL_TABLET | ORAL | 0 refills | Status: DC
Start: 2021-10-08 — End: 2021-10-20

## 2021-10-16 IMAGING — DX DG LUMBAR SPINE COMPLETE 4+V
5 series · 5 of 5 positions shown · non-contrast
Comparison: None.

CLINICAL DATA: Low back pain with left-sided radicular symptoms

EXAM:
LUMBAR SPINE - COMPLETE 4+ VIEW

[l-spine ap]
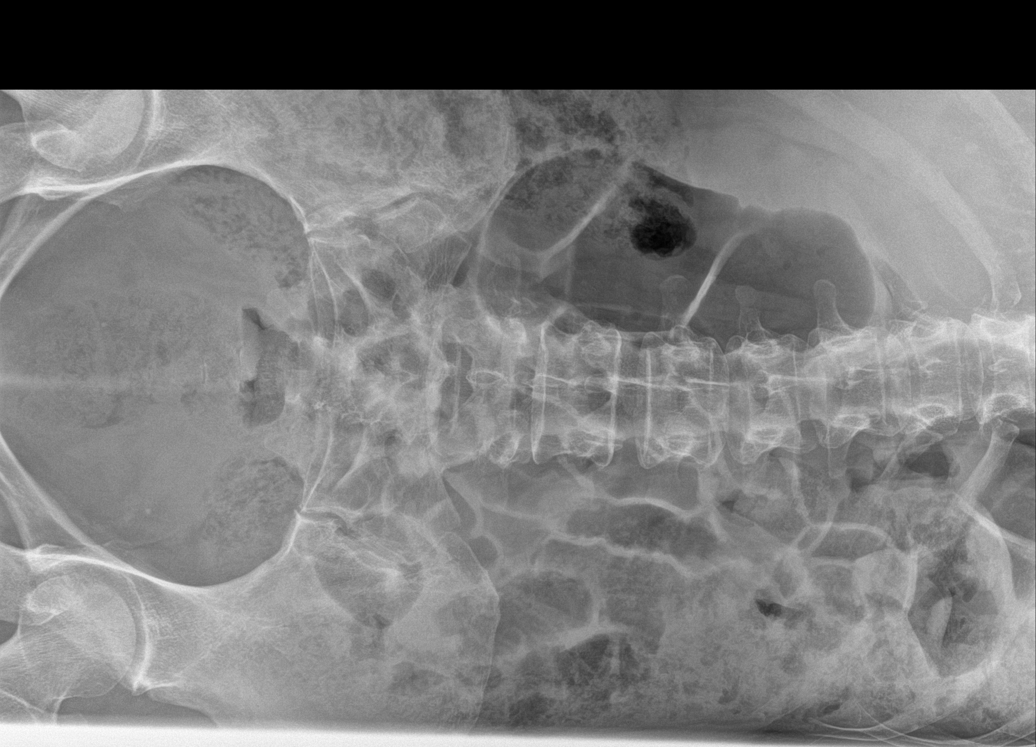

[l-spine obl (1 of 2)]
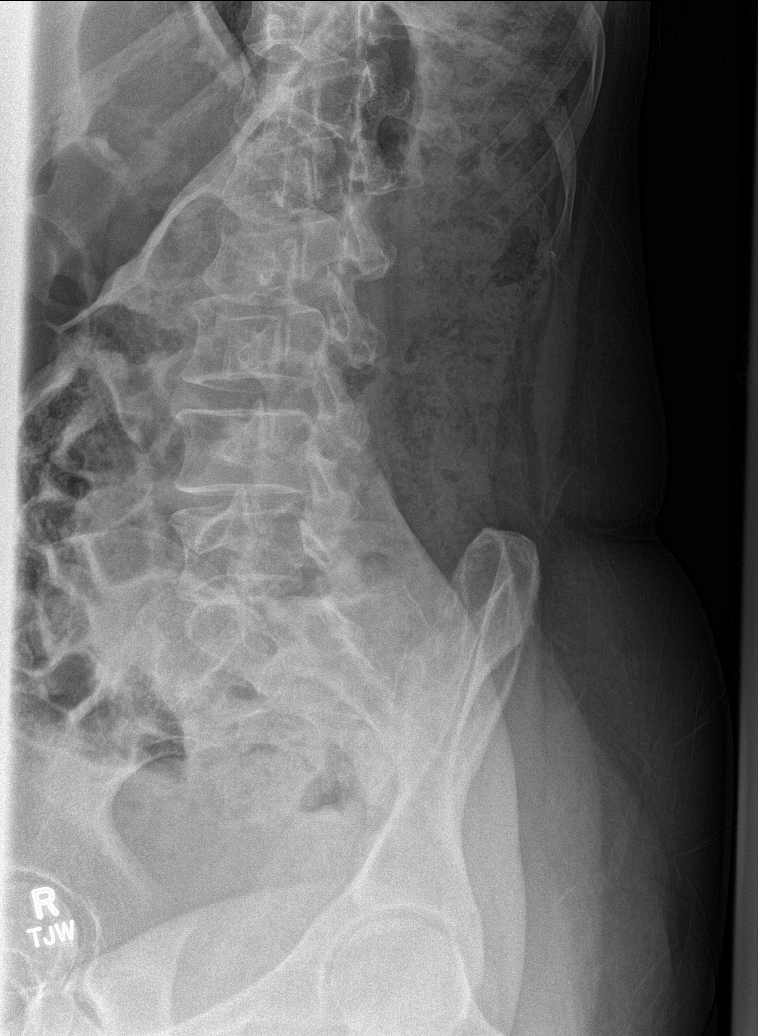

[l-spine obl (2 of 2)]
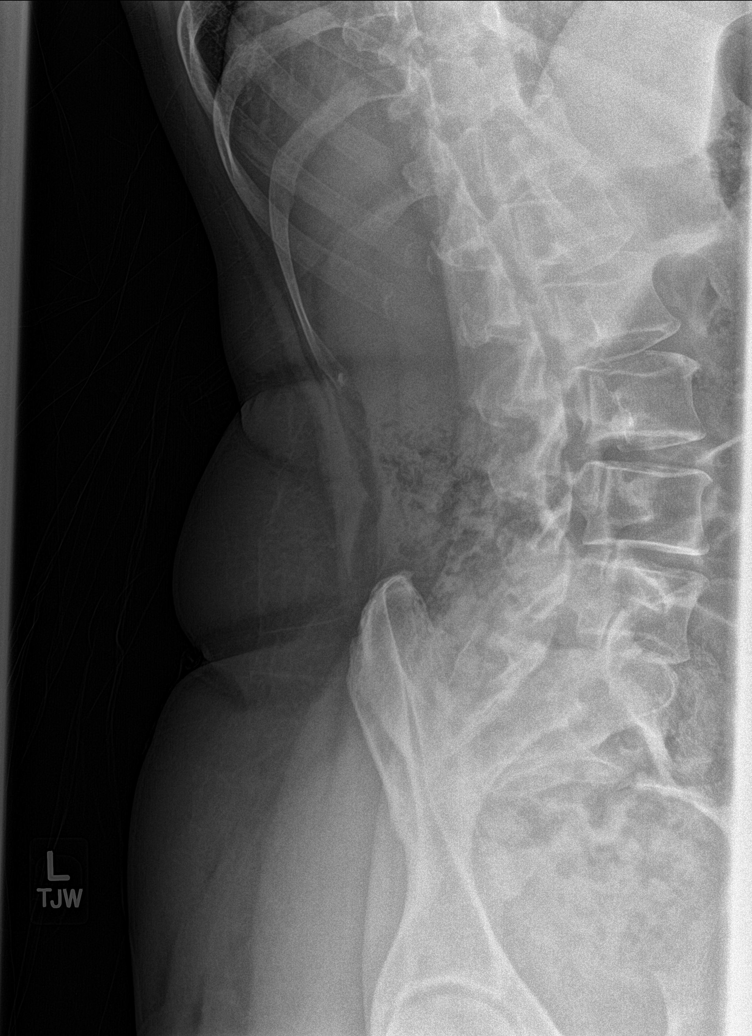

[l-spine lat]
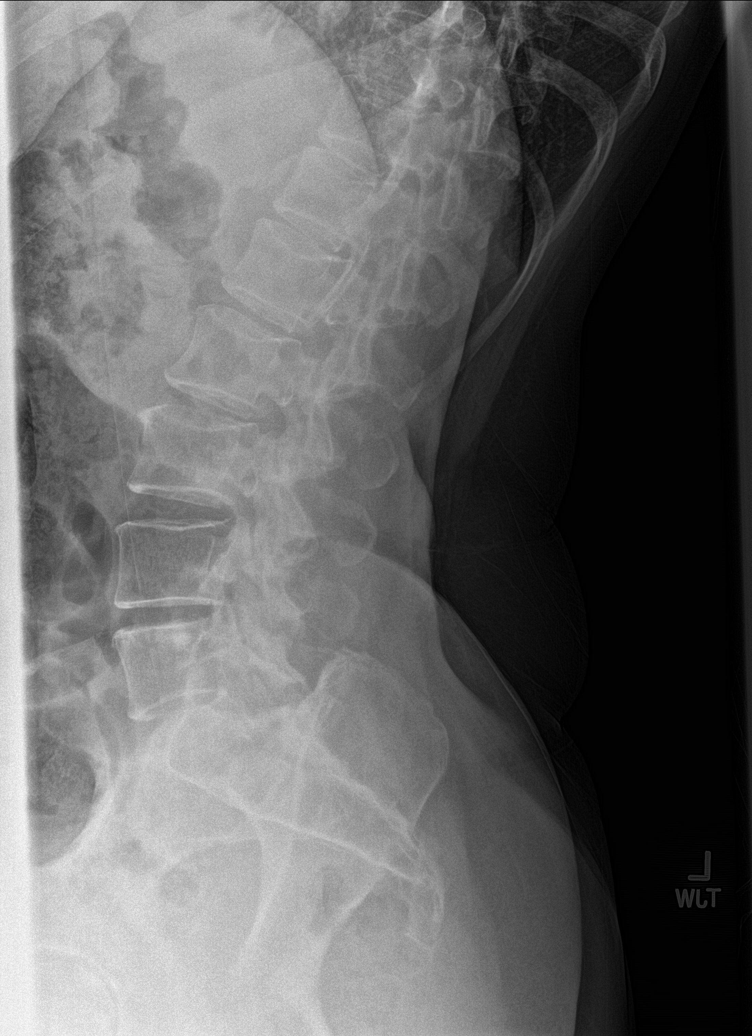

[l-spine l5/s1]
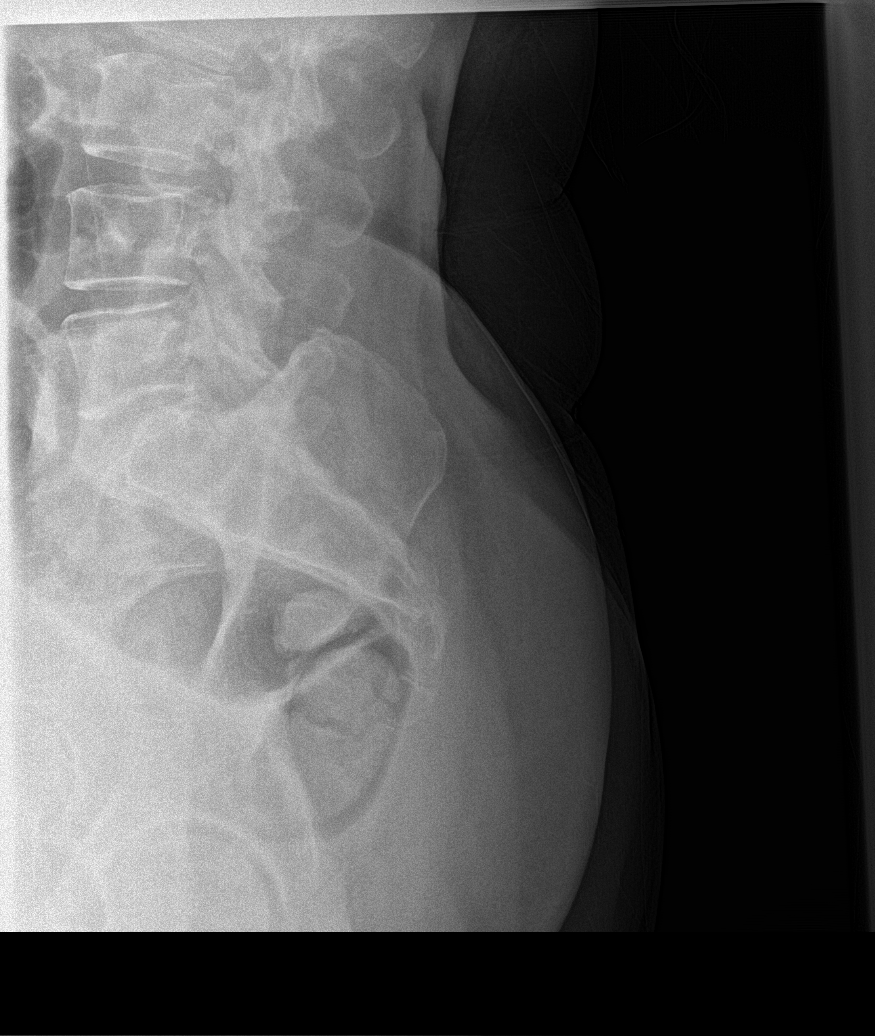

[5 of 5 positions shown; findings below may reference images not displayed]

FINDINGS: Frontal, lateral, spot lumbosacral lateral, and bilateral oblique
views were obtained. There are 5 non-rib-bearing lumbar type
vertebral bodies. There is mild lumbar levoscoliosis. There is no
fracture or spondylolisthesis. There is slight disc space narrowing
at L5-S1. Other disc spaces appear unremarkable. There is no
appreciable facet arthropathy.
IMPRESSION: Mild levoscoliosis. Mild disc space narrowing at L5-S1. Other disc
spaces appear unremarkable. No appreciable facet arthropathy. No
fracture or spondylolisthesis.

## 2021-10-16 IMAGING — DX DG HIP (WITH OR WITHOUT PELVIS) 2-3V*L*
3 series · 3 of 3 positions shown · non-contrast
Comparison: None.

CLINICAL DATA: Pain

EXAM:
DG HIP (WITH OR WITHOUT PELVIS) 2-3V LEFT

[pelvis ap]
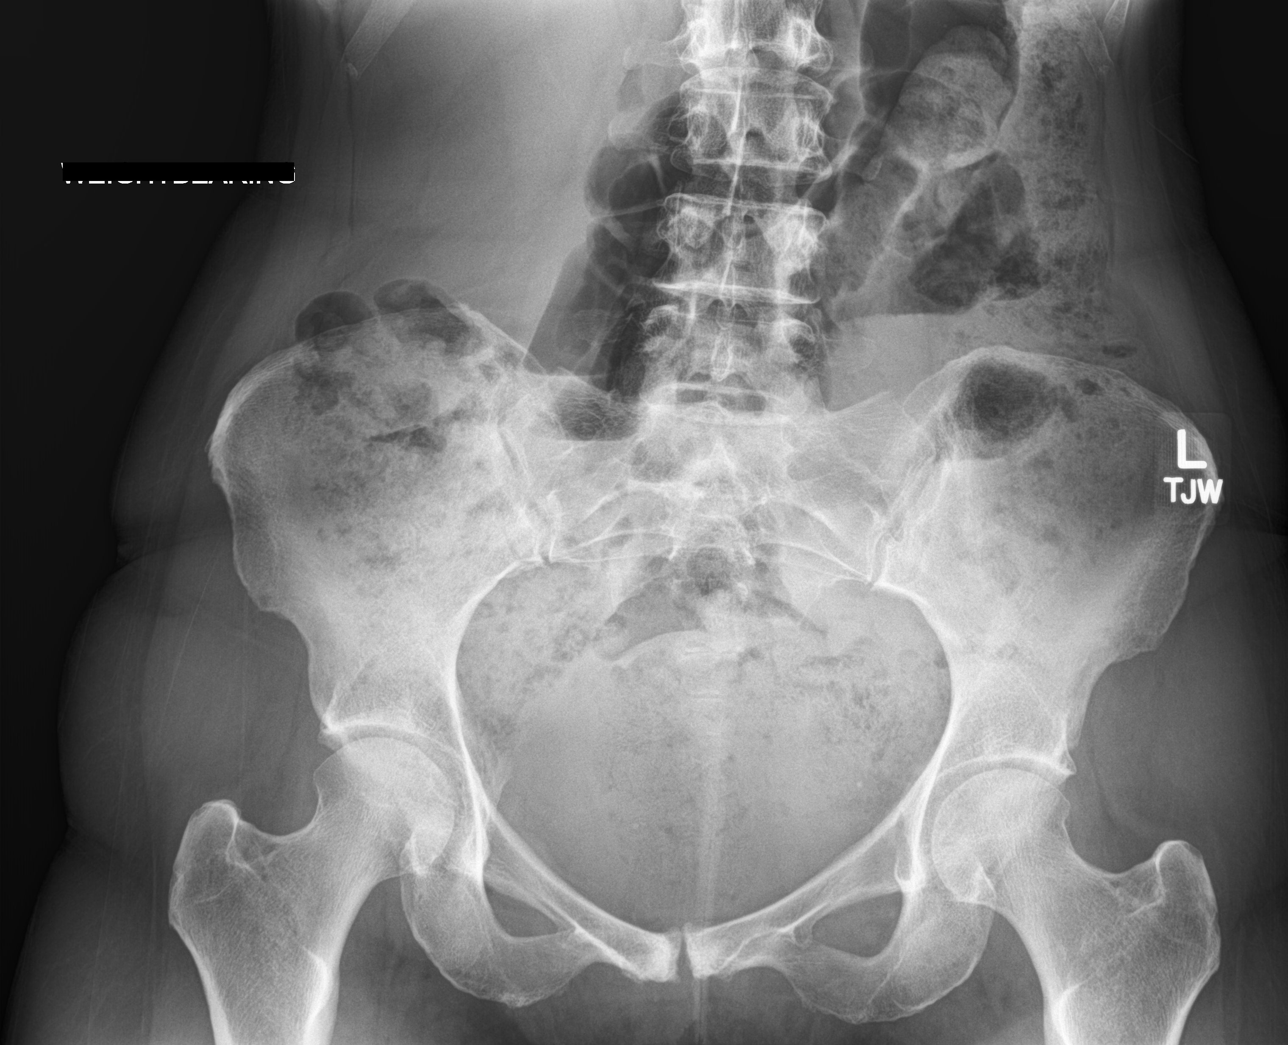

[hip ap]
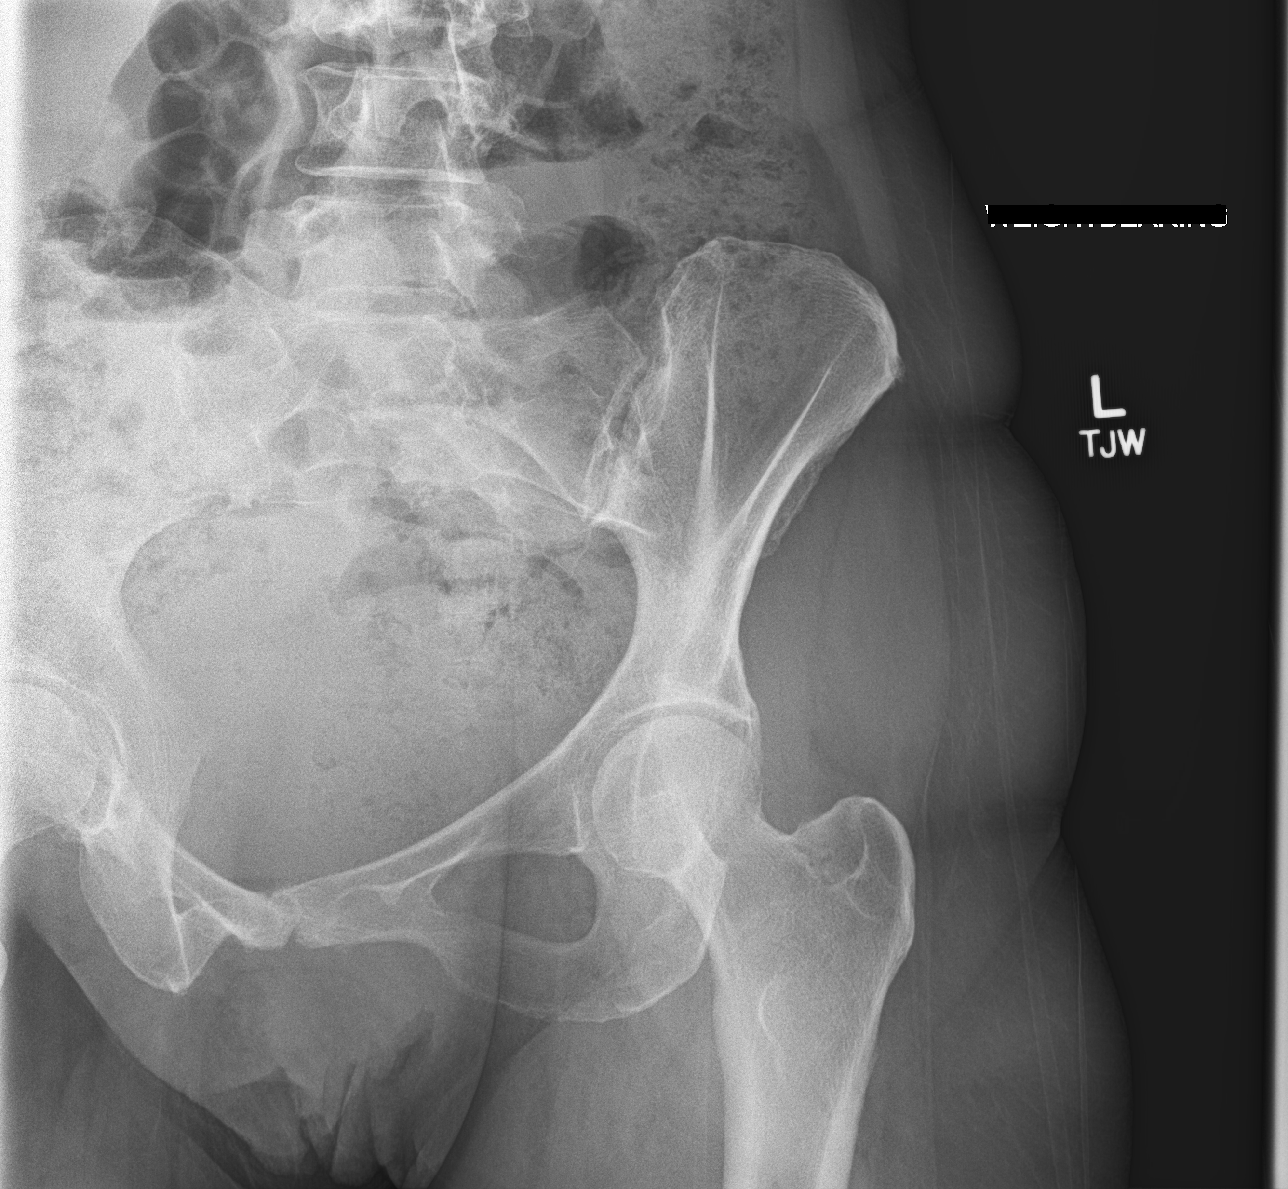

[hip lat]
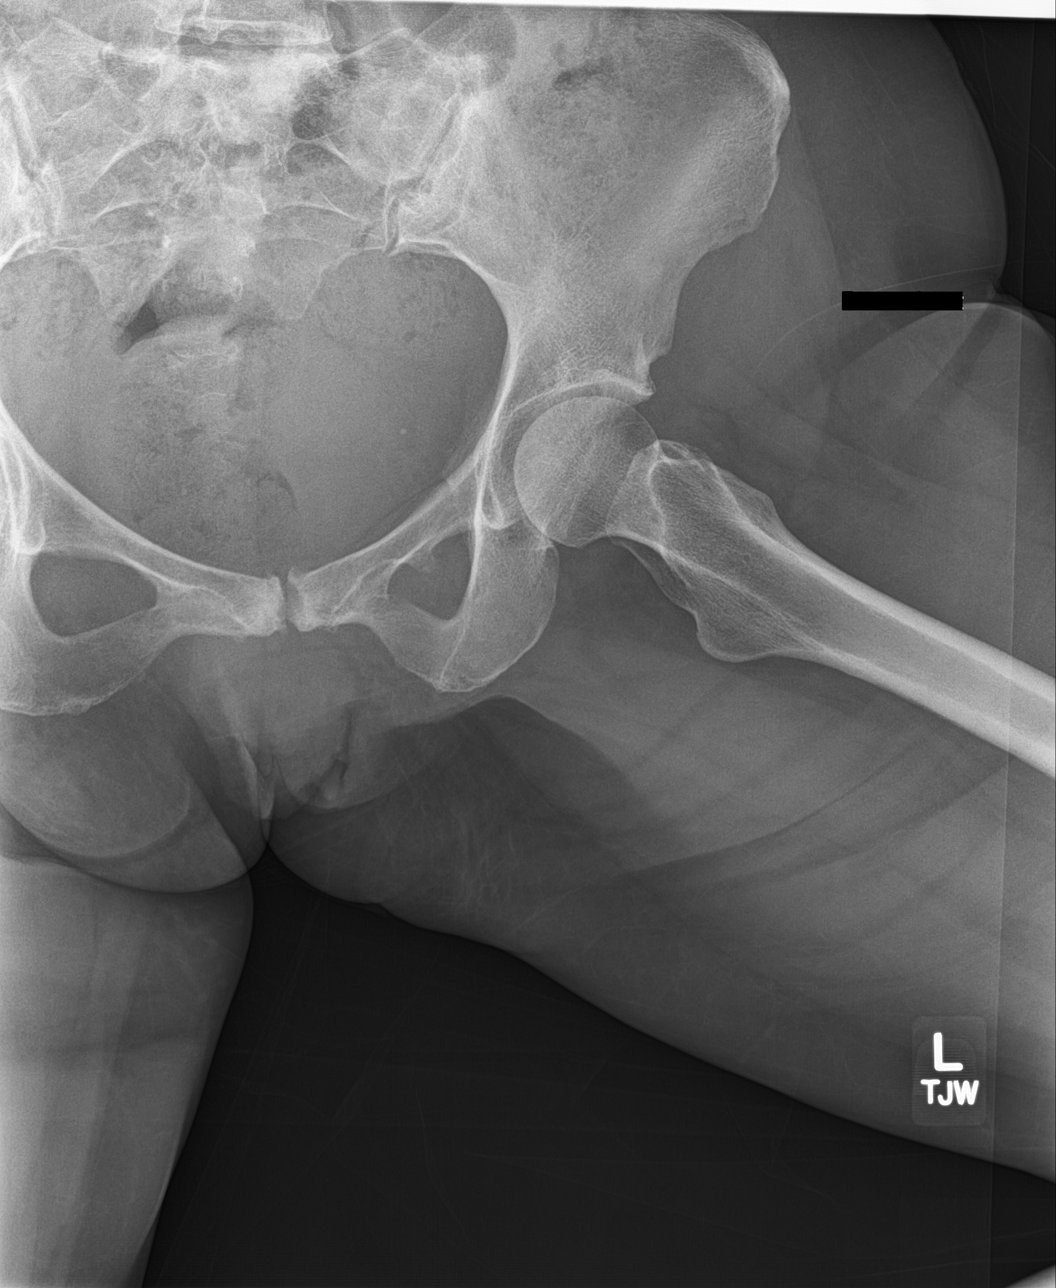

[3 of 3 positions shown; findings below may reference images not displayed]

FINDINGS: Weightbearing frontal pelvis as well as weightbearing frontal and
recumbent lateral left hip images were obtained. No fracture or
dislocation. The joint spaces appear unremarkable. No erosive
change. Diffuse stool noted throughout colon.
IMPRESSION: No fracture or dislocation. No evident arthropathy. Diffuse stool
throughout colon; question a degree of constipation.

## 2021-10-20 ENCOUNTER — Encounter: Payer: Self-pay | Admitting: Pulmonary Disease

## 2021-10-20 ENCOUNTER — Ambulatory Visit (INDEPENDENT_AMBULATORY_CARE_PROVIDER_SITE_OTHER): Payer: BC Managed Care – PPO | Admitting: Pulmonary Disease

## 2021-10-20 VITALS — BP 110/78 | HR 95 | Ht 64.5 in | Wt 156.0 lb

## 2021-10-20 DIAGNOSIS — J309 Allergic rhinitis, unspecified: Secondary | ICD-10-CM | POA: Diagnosis not present

## 2021-10-20 DIAGNOSIS — R053 Chronic cough: Secondary | ICD-10-CM

## 2021-10-20 NOTE — Progress Notes (Signed)
? ? ?Subjective:  ? ?PATIENT ID: Katie Todd GENDER: female DOB: [birth year], MRN: [id number] ? ? ?HPI ? ?Chief Complaint  ?Patient presents with  ? Consult  ?  Cough for years  ? ? ?Reason for Visit: New consult for chronic cough ? ?Mr. Katie Todd is a 61 year old female former smoker with hx histoplasmosis (not treated), seasonal allergies and GERD who presents for evaluation for chronic cough. ? ?Previously very active however coughing interferes with activity and even with eating. Triggered with odors, aersols like hairspray. She reports gradually worsening symptoms with cough. Will develop shortness of breath with cough. Cough will awaken her at night. She had COVID three weeks ago and treated with anti-viral. ? ?Previously seen by ENT and Pulmonary in [place] at [facility] clinic. Did no further work-up after she moved to [facility] after 2014. Has tried Advair for 6 months without change. ? ?She is currently on pepcid and zyrtec. Takes flonase, bendaryl and mucinex daily. PRN sudaphed once a month. She recently completed prednisone taper for 10 days with complete suppression. ? ?Occasional reports purple colored toes and work-up negative for Raynaud's.  ? 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 ? ?I have personally reviewed patient's past medical/family/social history, allergies, current medications. ? ?Past Medical History:  ?Diagnosis Date  ? Anxiety state, unspecified 02/23/2013  ? GERD (gastroesophageal reflux disease) 02/23/2013  ? Histoplasmosis 2006  ? Insomnia   ? PAC (premature atrial contraction)   ? PVC (premature ventricular contraction)   ? Seasonal allergies 02/23/2013  ?  ? ?Family History  ?Problem Relation Age of Onset  ? Breast cancer Mother 55  ?  ? ?Social History  ? ?Occupational History  ? Not on file  ?Tobacco Use  ? Smoking status: Former  ?  Types: Cigarettes  ?  Quit date: 02/20/1990  ?  Years since quitting: 31.6  ? Smokeless tobacco: Never  ?Vaping Use  ? Vaping Use: Never used   ?Substance and Sexual Activity  ? Alcohol use: Yes  ?  Alcohol/week: 0.0 standard drinks  ?  Comment: rarely  ? Drug use: No  ? Sexual activity: Yes  ?  Partners: Male  ?  Birth control/protection: Surgical  ?  Comment: tubal ligation  ? ? ?Allergies  ?Allergen Reactions  ? Penicillins Anaphylaxis  ?  ? ?Outpatient Medications Prior to Visit  ?Medication Sig Dispense Refill  ? albuterol (VENTOLIN HFA) 108 (90 Base) MCG/ACT inhaler INHALE 2 PUFFS INTO THE LUNGS EVERY 4 HOURS AS NEEDED FOR WHEEZE 8.5 each 2  ? aspirin 81 MG chewable tablet Chew 81 mg by mouth daily.    ? Biotin 5000 MCG TABS Take 2 tablets by mouth daily.    ? calcium carbonate 1250 MG capsule Take 1,250 mg by mouth 2 (two) times daily with a meal.    ? cetirizine (ZYRTEC) 10 MG tablet Take 10 mg by mouth daily.    ? Dextromethorphan-guaiFENesin (ROBITUSSIN DM PO)     ? diphenhydrAMINE (BENADRYL) 25 mg capsule Take 50 mg by mouth every 6 (six) hours as needed.     ? docusate sodium (COLACE) 100 MG capsule Take 200 mg by mouth 2 (two) times daily.    ? doxylamine, Sleep, (UNISOM) 25 MG tablet Take 25 mg by mouth at bedtime as needed.     ? famotidine (PEPCID) 20 MG tablet Take 40 mg by mouth daily.    ? fluticasone (FLONASE) 50 MCG/ACT nasal spray SPRAY 2 SPRAYS IN [facility]  NOSTRIL NIGHTLY  11  ? Guaifenesin (MUCINEX MAXIMUM STRENGTH) 1200 MG TB12 Take 1 tablet by mouth in the morning and at bedtime.    ? Magnesium 400 MG TABS Take 1 tablet by mouth 2 (two) times daily.    ? metoprolol succinate (TOPROL-XL) 25 MG 24 hr tablet TAKE 1 TABLET BY MOUTH EVERY DAY 90 tablet 3  ? montelukast (SINGULAIR) 10 MG tablet TAKE 1 TABLET BY MOUTH EVERYDAY AT BEDTIME 90 tablet 3  ? Multiple Vitamin (MULTIVITAMIN) tablet Take 1 tablet by mouth daily.    ? pseudoephedrine (SUDAFED) 120 MG 12 hr tablet Take 120 mg by mouth daily.    ? Spacer/Aero-Holding Dorise Bullion Use with Albuterol inhaler 1 each 0  ? diclofenac (VOLTAREN) 75 MG EC tablet TAKE 1 TABLET BY MOUTH TWICE A  DAY 180 tablet 1  ? doxycycline (VIBRA-TABS) 100 MG tablet Take 1 tablet (100 mg total) by mouth 2 (two) times daily. 28 tablet 0  ? nirmatrelvir & ritonavir (PAXLOVID, 300/100,) 20 x 150 MG & 10 x '100MG'$  TBPK Take 2 tablets nirmatrelvir and 1 tablet ritonavir twice daily. 30 tablet 0  ? predniSONE (DELTASONE) 20 MG tablet 2 tabs po daily for 5 days, then 1 tab po daily for 5 days 15 tablet 0  ? promethazine-dextromethorphan (PROMETHAZINE-DM) 6.25-15 MG/5ML syrup Take 5 mLs by mouth 4 (four) times daily as needed for cough. 200 mL 0  ? ?No facility-administered medications prior to visit.  ? ? ?Review of Systems  ?Constitutional:  Negative for chills, diaphoresis, fever, malaise/fatigue and weight loss.  ?HENT:  Negative for congestion.   ?Respiratory:  Positive for cough and shortness of breath. Negative for hemoptysis, sputum production and wheezing.   ?Cardiovascular:  Negative for chest pain, palpitations and leg swelling.  ? ? ?Objective:  ? ?Vitals:  ? 10/20/21 1517  ?BP: 110/78  ?Pulse: 95  ?SpO2: 98%  ?Weight: 156 lb (70.8 kg)  ?Height: 5' 4.5" (1.638 m)  ? ?SpO2: 98 % ?O2 Device: None (Room air) ? ?Physical Exam: ?General: Well-appearing, no acute distress ?HENT: [place], AT ?Eyes: EOMI, no scleral icterus ?Respiratory: Clear to auscultation bilaterally.  No crackles, wheezing or rales ?Cardiovascular: RRR, -M/R/G, no JVD ?Extremities:-Edema,-tenderness ?Neuro: AAO x4, CNII-XII grossly intact ?Psych: Normal mood, normal affect ? ?Data Reviewed: ? ?Imaging: ?CT Chest 08/31/21 - Prior sequela of granulomatous disease. Noncalcified nodules bilaterally, largest measuring 37m ?PFT: ?03/21/14 - Normal spirometry ? ?Labs: ?CBC ?   ?Component Value Date/Time  ? WBC 4.8 05/13/2021 0841  ? RBC 4.42 05/13/2021 0841  ? HGB 13.7 05/13/2021 0841  ? HCT 40.4 05/13/2021 0841  ? PLT 361.0 05/13/2021 0841  ? MCV 91.6 05/13/2021 0841  ? MCHC 33.8 05/13/2021 0841  ? RDW 13.3 05/13/2021 0841  ? LYMPHSABS 2.0 05/13/2021 0841  ? MONOABS  0.4 05/13/2021 0841  ? EOSABS 0.1 05/13/2021 0841  ? BASOSABS 0.1 05/13/2021 0841  ? ?Absolute eos 05/13/21 - 100 ? ?   ?Assessment & Plan:  ? ?Discussion: ?61year old female former smoker with hx histoplasmosis (not treated), seasonal allergies and GERD who presents for evaluation for chronic cough. Common causes of cough were discussed including upper airway cough syndrome, reflux and undiagnosed obstructive lung disease. We reviewed evaluation and management for cough as noted below. ? ?Cough responsive to steroids however this cannot be a longterm solution. Will rule out pulmonary causes of cough. ? ?Chronic cough ?--ARRANGE for pulmonary function tests ?--Allergies: START loratadine or cetrizine 10 mg once a  day. Buy over-the counter ?--Reflux: START omeprazole 40 mg once a day. Buy over-the-counter ? ? ?Health Maintenance ?Immunization History  ?Administered Date(s) Administered  ? Influenza,inj,Quad PF,6+ Mos 05/15/2017, 06/08/2018, 04/09/2019, 03/17/2020  ? Influenza-Unspecified 05/17/2021  ? PFIZER(Purple Top)SARS-COV-2 Vaccination 10/02/2019, 10/31/2019, 06/04/2020  ? Pension scheme manager 55yr & up 05/17/2021  ? Tdap 09/15/2016  ? ?CT Lung Screen - not qualified. Quit > 15 years ago ? ?Orders Placed This Encounter  ?Procedures  ? Pulmonary function test  ?  Standing Status:   Future  ?  Standing Expiration Date:   10/21/2022  ?  Order Specific Question:   Where should this test be performed?  ?  Answer:   [facility]  ?  Order Specific Question:   Full PFT: includes the following: basic spirometry, spirometry pre & post bronchodilator, diffusion capacity (DLCO), lung volumes  ?  Answer:   Full PFT  ?No orders of the defined types were placed in this encounter. ? ? ?No follow-ups on file. After PFTs in May ? ?I have spent a total time of 45-minutes on the day of the appointment reviewing prior documentation, coordinating care and discussing medical diagnosis and plan with the  patient/family. Imaging, labs and tests included in this note have been reviewed and interpreted independently by me. ? ?Ellason Segar JRodman Pickle MD ?[facility] ?10/20/2021 3:30 PM  ?Office Nu

## 2021-10-20 NOTE — Patient Instructions (Addendum)
Chronic cough ?--ARRANGE for pulmonary function tests ?--Allergies: Continue Zyrtec (cetrizine) 10 mg once a day and Flonase daily ?--Reflux: START omeprazole 40 mg once a day. Buy over-the-counter ?--If PFTs normal and omeprazole trial fails, will start gabapentin ? ?Allergic Rhinitis ?--OK to stop Singulair. Monitor for signs and symptoms of worsening allergies ? ?Follow-up with me after PFTs ?

## 2021-10-21 ENCOUNTER — Encounter: Payer: Self-pay | Admitting: Pulmonary Disease

## 2021-10-22 DIAGNOSIS — Z1211 Encounter for screening for malignant neoplasm of colon: Secondary | ICD-10-CM | POA: Diagnosis not present

## 2021-10-29 LAB — COLOGUARD: COLOGUARD: NEGATIVE

## 2021-11-26 ENCOUNTER — Ambulatory Visit (INDEPENDENT_AMBULATORY_CARE_PROVIDER_SITE_OTHER): Payer: BC Managed Care – PPO | Admitting: Pulmonary Disease

## 2021-11-26 ENCOUNTER — Encounter: Payer: Self-pay | Admitting: Pulmonary Disease

## 2021-11-26 VITALS — BP 118/64 | HR 81 | Temp 98.2°F | Ht 64.0 in | Wt 149.4 lb

## 2021-11-26 DIAGNOSIS — R053 Chronic cough: Secondary | ICD-10-CM

## 2021-11-26 LAB — PULMONARY FUNCTION TEST
DL/VA % pred: 107 %
DL/VA: 4.53 ml/min/mmHg/L
DLCO cor % pred: 103 %
DLCO cor: 20.85 ml/min/mmHg
DLCO unc % pred: 103 %
DLCO unc: 20.85 ml/min/mmHg
FEF 25-75 Post: 2.84 L/sec
FEF 25-75 Pre: 2.69 L/sec
FEF2575-%Change-Post: 5 %
FEF2575-%Pred-Post: 123 %
FEF2575-%Pred-Pre: 116 %
FEV1-%Change-Post: 1 %
FEV1-%Pred-Post: 103 %
FEV1-%Pred-Pre: 102 %
FEV1-Post: 2.61 L
FEV1-Pre: 2.59 L
FEV1FVC-%Change-Post: 3 %
FEV1FVC-%Pred-Pre: 103 %
FEV6-%Change-Post: -2 %
FEV6-%Pred-Post: 99 %
FEV6-%Pred-Pre: 101 %
FEV6-Post: 3.13 L
FEV6-Pre: 3.21 L
FEV6FVC-%Pred-Post: 103 %
FEV6FVC-%Pred-Pre: 103 %
FVC-%Change-Post: -2 %
FVC-%Pred-Post: 95 %
FVC-%Pred-Pre: 97 %
FVC-Post: 3.13 L
FVC-Pre: 3.21 L
Post FEV1/FVC ratio: 83 %
Post FEV6/FVC ratio: 100 %
Pre FEV1/FVC ratio: 81 %
Pre FEV6/FVC Ratio: 100 %
RV % pred: 107 %
RV: 2.16 L
TLC % pred: 107 %
TLC: 5.41 L

## 2021-11-26 MED ORDER — IPRATROPIUM BROMIDE 0.03 % NA SOLN
2.0000 | Freq: Two times a day (BID) | NASAL | 12 refills | Status: DC
Start: 2021-11-26 — End: 2022-08-05

## 2021-11-26 NOTE — Progress Notes (Signed)
PFT done today. 

## 2021-11-26 NOTE — Patient Instructions (Addendum)
?  Chronic cough ?--Allergies:   ?START atrovent spray 1 spray per nare in the morning and evening as needed ?CONTINUE cetrizine 10 mg once a day. Buy over-the counter ? ?--Reflux:      ?CONTINUE omeprazole 40 mg once a day for up to 8 weeks. Decrease to 20 mg after this ? ?REFERRAL to Allergy. Dr. Ernst Bowler if available ? ?Follow-up with as needed ? ?

## 2021-11-26 NOTE — Progress Notes (Signed)
? ? ?Subjective:  ? ?PATIENT ID: Katie Todd GENDER: female DOB: 02-07-1961, MRN: [id number] ? ? ?HPI ? ?Chief Complaint  ?Patient presents with  ? Follow-up  ?  Pt states that her cough is not any better then the last visit. Pt states the Prilosec is helping her stomach. Pt had full PFTs done today.   ? ? ?Reason for Visit: Follow-up ? ?Mr. Ebonye is a 61 year old female former smoker with hx histoplasmosis (not treated), seasonal allergies and GERD who presents for PFT follow-up. ? ?Initial consult ?Previously very active however coughing interferes with activity and even with eating. Triggered with odors, aersols like hairspray. She reports gradually worsening symptoms with cough. Will develop shortness of breath with cough. Cough will awaken her at night. She had COVID three weeks ago and treated with anti-viral. ? ?Previously seen by ENT and Pulmonary in [place] at [facility] clinic. Did no further work-up after she moved to [facility] after 2014. Has tried Advair for 6 months without change. ? ?She is currently on pepcid and zyrtec. Takes flonase, bendaryl and mucinex daily. PRN sudaphed once a month. She recently completed prednisone taper for 10 days with complete suppression. ? ?Occasional reports purple colored toes and work-up negative for Raynaud's.  ? ?11/26/21 ?She has severe sinus congestion. On sudafed twice a day, mucinex twice a day, benadryl sometimes, flonase. Continues to have symptoms despite support. She reports omeprazole 40 mg has helped with her reflux but not her cough. ? 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 ? ? ?Past Medical History:  ?Diagnosis Date  ? Anxiety state, unspecified 02/23/2013  ? GERD (gastroesophageal reflux disease) 02/23/2013  ? Histoplasmosis 2006  ? Insomnia   ? PAC (premature atrial contraction)   ? PVC (premature ventricular contraction)   ? Seasonal allergies 02/23/2013  ?  ? ?Family History  ?Problem Relation Age of Onset  ? Breast cancer Mother 6  ?   ? ?Social History  ? ?Occupational History  ? Not on file  ?Tobacco Use  ? Smoking status: Former  ?  Types: Cigarettes  ?  Quit date: 02/20/1990  ?  Years since quitting: 31.7  ? Smokeless tobacco: Never  ?Vaping Use  ? Vaping Use: Never used  ?Substance and Sexual Activity  ? Alcohol use: Yes  ?  Alcohol/week: 0.0 standard drinks  ?  Comment: rarely  ? Drug use: No  ? Sexual activity: Yes  ?  Partners: Male  ?  Birth control/protection: Surgical  ?  Comment: tubal ligation  ? ? ?Allergies  ?Allergen Reactions  ? Penicillins Anaphylaxis  ?  ? ?Outpatient Medications Prior to Visit  ?Medication Sig Dispense Refill  ? albuterol (VENTOLIN HFA) 108 (90 Base) MCG/ACT inhaler INHALE 2 PUFFS INTO THE LUNGS EVERY 4 HOURS AS NEEDED FOR WHEEZE 8.5 each 2  ? aspirin 81 MG chewable tablet Chew 81 mg by mouth daily.    ? Biotin 5000 MCG TABS Take 2 tablets by mouth daily.    ? calcium carbonate 1250 MG capsule Take 1,250 mg by mouth 2 (two) times daily with a meal.    ? cetirizine (ZYRTEC) 10 MG tablet Take 10 mg by mouth daily.    ? Dextromethorphan-guaiFENesin (ROBITUSSIN DM PO)     ? diclofenac (VOLTAREN) 75 MG EC tablet TAKE 1 TABLET BY MOUTH TWICE A DAY 180 tablet 1  ? diphenhydrAMINE (BENADRYL) 25 mg capsule Take 50 mg by mouth every 6 (six) hours as needed.     ?  docusate sodium (COLACE) 100 MG capsule Take 200 mg by mouth 2 (two) times daily.    ? doxylamine, Sleep, (UNISOM) 25 MG tablet Take 25 mg by mouth at bedtime as needed.     ? famotidine (PEPCID) 20 MG tablet Take 40 mg by mouth daily.    ? fluticasone (FLONASE) 50 MCG/ACT nasal spray SPRAY 2 SPRAYS IN EACH NOSTRIL NIGHTLY  11  ? Guaifenesin (MUCINEX MAXIMUM STRENGTH) 1200 MG TB12 Take 1 tablet by mouth in the morning and at bedtime.    ? Magnesium 400 MG TABS Take 1 tablet by mouth 2 (two) times daily.    ? metoprolol succinate (TOPROL-XL) 25 MG 24 hr tablet TAKE 1 TABLET BY MOUTH EVERY DAY 90 tablet 3  ? montelukast (SINGULAIR) 10 MG tablet TAKE 1 TABLET BY  MOUTH EVERYDAY AT BEDTIME 90 tablet 3  ? Multiple Vitamin (MULTIVITAMIN) tablet Take 1 tablet by mouth daily.    ? promethazine-dextromethorphan (PROMETHAZINE-DM) 6.25-15 MG/5ML syrup Take 5 mLs by mouth 4 (four) times daily as needed for cough. 200 mL 0  ? pseudoephedrine (SUDAFED) 120 MG 12 hr tablet Take 120 mg by mouth daily.    ? Spacer/Aero-Holding Chambers DEVI Use with Albuterol inhaler 1 each 0  ? ?No facility-administered medications prior to visit.  ? ? ?Review of Systems  ?Constitutional:  Negative for chills, diaphoresis, fever, malaise/fatigue and weight loss.  ?HENT:  Positive for congestion.   ?Respiratory:  Positive for cough. Negative for hemoptysis, sputum production, shortness of breath and wheezing.   ?Cardiovascular:  Negative for chest pain, palpitations and leg swelling.  ? ? ?Objective:  ? ?Vitals:  ? 11/26/21 1347  ?BP: 118/64  ?Pulse: 81  ?Temp: 98.2 ?F (36.8 ?C)  ?TempSrc: Oral  ?SpO2: 99%  ?Weight: 149 lb 6.4 oz (67.8 kg)  ?Height: '5\' 4"'$  (1.626 m)  ? ?  ?Physical Exam: ?General: Well-appearing, no acute distress ?HENT: [place], AT ?Eyes: EOMI, no scleral icterus ?Respiratory: Clear to auscultation bilaterally.  No crackles, wheezing or rales ?Cardiovascular: RRR, -M/R/G, no JVD ?Extremities:-Edema,-tenderness ?Neuro: AAO x4, CNII-XII grossly intact ?Psych: Normal mood, normal affect ? ?Data Reviewed: ? ?Imaging: ?CT Chest 08/31/21 - Prior sequela of granulomatous disease. Noncalcified nodules bilaterally, largest measuring 40m ?PFT: ?03/21/14 - Normal spirometry ?12/02/21 ?FVC 3.13 (95%) FEV1 2.61 (81%) Ratio 107  TLC 103% DLCO 103% ?Interpretation: Normal PFTs. No obstructive or restrictive defect. No significant bronchodilator response. Normal F-V loops. ? ?Labs: ?CBC ?   ?Component Value Date/Time  ? WBC 4.8 05/13/2021 0841  ? RBC 4.42 05/13/2021 0841  ? HGB 13.7 05/13/2021 0841  ? HCT 40.4 05/13/2021 0841  ? PLT 361.0 05/13/2021 0841  ? MCV 91.6 05/13/2021 0841  ? MCHC 33.8 05/13/2021 0841  ?  RDW 13.3 05/13/2021 0841  ? LYMPHSABS 2.0 05/13/2021 0841  ? MONOABS 0.4 05/13/2021 0841  ? EOSABS 0.1 05/13/2021 0841  ? BASOSABS 0.1 05/13/2021 0841  ? ?Absolute eos 05/13/21 - 100 ? ?   ?Assessment & Plan:  ? ?61year old female former smoker with hx histoplasmosis (not treated), seasonal allergies and GERD who present for follow-up for chronic cough. PFTs reviewed together and normal with no evidence of obstructive or restrictive lung disease. We again discussed alternative causes of cough including upper airway cough syndrome and reflux. May benefit from Allergy evaluation. ? ?Chronic cough ?--Allergies:   ?START atrovent spray 1 spray per nare in the morning and evening as needed ?CONTINUE cetrizine 10 mg once a day. Buy over-the  counter ? ?--Reflux:      ?CONTINUE omeprazole 40 mg once a day for up to 8 weeks. Decrease to 20 mg after this ? ?REFERRAL to Allergy. Dr. Ernst Bowler if available ? ? ? ?Health Maintenance ?Immunization History  ?Administered Date(s) Administered  ? Influenza,inj,Quad PF,6+ Mos 05/15/2017, 06/08/2018, 04/09/2019, 03/17/2020  ? Influenza-Unspecified 05/17/2021  ? PFIZER(Purple Top)SARS-COV-2 Vaccination 10/02/2019, 10/31/2019, 06/04/2020  ? Pension scheme manager 15yr & up 05/17/2021  ? Tdap 09/15/2016  ? ?CT Lung Screen - not qualified. Quit > 15 years ago ? ?Orders Placed This Encounter  ?Procedures  ? Ambulatory referral to Allergy  ?  Referral Priority:   Routine  ?  Referral Type:   Allergy Testing  ?  Referral Reason:   Specialty Services Required  ?  Requested Specialty:   Allergy  ?  Number of Visits Requested:   1  ? ?Meds ordered this encounter  ?Medications  ? ipratropium (ATROVENT) 0.03 % nasal spray  ?  Sig: Place 2 sprays into both nostrils every 12 (twelve) hours.  ?  Dispense:  30 mL  ?  Refill:  12  ? ? ?Return if symptoms worsen or fail to improve.  ? ?I have spent a total time of 35-minutes on the day of the appointment including chart review, data  review, collecting history, coordinating care and discussing medical diagnosis and plan with the patient/family. Past medical history, allergies, medications were reviewed. Pertinent imaging, labs and test

## 2021-12-02 ENCOUNTER — Encounter: Payer: Self-pay | Admitting: Pulmonary Disease

## 2021-12-10 ENCOUNTER — Ambulatory Visit (INDEPENDENT_AMBULATORY_CARE_PROVIDER_SITE_OTHER): Payer: BC Managed Care – PPO | Admitting: Family Medicine

## 2021-12-10 ENCOUNTER — Encounter: Payer: Self-pay | Admitting: Family Medicine

## 2021-12-10 DIAGNOSIS — R229 Localized swelling, mass and lump, unspecified: Secondary | ICD-10-CM | POA: Diagnosis not present

## 2021-12-10 MED ORDER — DOXYCYCLINE HYCLATE 100 MG PO TABS: 100.0000 mg | ORAL_TABLET | Freq: Two times a day (BID) | ORAL | 0 refills | Status: DC

## 2021-12-10 NOTE — Progress Notes (Signed)
Subjective:    Patient ID: Katie Todd, female    DOB: Dec 14, 1960, 61 y.o.   MRN: [id number]  HPI 61 yo pt of Dr Lorelei Pont presents with ? Abscess  Wt Readings from Last 3 Encounters:  12/10/21 149 lb 4 oz (67.7 kg)  11/26/21 149 lb 6.4 oz (67.8 kg)  10/20/21 156 lb (70.8 kg)   25.62 kg/m   Has a lump in tailbone area  Left side  Noted it when she was bathing   Noted it when showering  It burst  She put a dressing on it and expressed it  Used some neosporin   This happened once in her 20s-it drained and got better   Not painful   Has never had pilonaidal cyst sin the past     (does have a pit but was worked up with MRI in the past and thought it was just a dimple)   She has problems with abscesses in the past however - different areas/groin  Had one removed from groin   No fevers Not sick feeling   Exercises a lot   She may have had a mrsa skin infection in the past  Patient Active Problem List   Diagnosis Date Noted   Lump of skin 12/10/2021   FHx: SVT (supraventricular tachycardia) 05/07/2020   Family history of abdominal aortic aneurysm (AAA) x 2 d/c, [place] 08/08/2018   Chronic cough 03/21/2014   Seasonal allergies 02/23/2013   GERD (gastroesophageal reflux disease) 02/23/2013   Anxiety state, unspecified 02/23/2013   Multiple lung nodules on CT 12/09/2005   History of chronic cough 09/10/2002   Past Medical History:  Diagnosis Date   Anxiety state, unspecified 02/23/2013   GERD (gastroesophageal reflux disease) 02/23/2013   Histoplasmosis 2006   Insomnia    PAC (premature atrial contraction)    PVC (premature ventricular contraction)    Seasonal allergies 02/23/2013   Past Surgical History:  Procedure Laterality Date   COLPOSCOPY  2010   Negative   DILATION AND CURETTAGE OF UTERUS     missed ab   NOSE SURGERY     Social History   Tobacco Use   Smoking status: Former    Types: Cigarettes    Quit date: 02/20/1990    Years since  quitting: 31.8   Smokeless tobacco: Never  Vaping Use   Vaping Use: Never used  Substance Use Topics   Alcohol use: Yes    Alcohol/week: 0.0 standard drinks    Comment: rarely   Drug use: No   Family History  Problem Relation Age of Onset   Breast cancer Mother 68   Allergies  Allergen Reactions   Penicillins Anaphylaxis   Current Outpatient Medications on File Prior to Visit  Medication Sig Dispense Refill   albuterol (VENTOLIN HFA) 108 (90 Base) MCG/ACT inhaler INHALE 2 PUFFS INTO THE LUNGS EVERY 4 HOURS AS NEEDED FOR WHEEZE 8.5 each 2   aspirin 81 MG chewable tablet Chew 81 mg by mouth daily.     Biotin 5000 MCG TABS Take 2 tablets by mouth daily.     calcium carbonate 1250 MG capsule Take 1,250 mg by mouth 2 (two) times daily with a meal.     cetirizine (ZYRTEC) 10 MG tablet Take 10 mg by mouth daily.     Dextromethorphan-guaiFENesin (ROBITUSSIN DM PO)      diphenhydrAMINE (BENADRYL) 25 mg capsule Take 50 mg by mouth every 6 (six) hours as needed.      docusate sodium (COLACE)  100 MG capsule Take 200 mg by mouth 2 (two) times daily.     doxylamine, Sleep, (UNISOM) 25 MG tablet Take 25 mg by mouth at bedtime as needed.      fluticasone (FLONASE) 50 MCG/ACT nasal spray SPRAY 2 SPRAYS IN EACH NOSTRIL NIGHTLY  11   Guaifenesin (MUCINEX MAXIMUM STRENGTH) 1200 MG TB12 Take 1 tablet by mouth in the morning and at bedtime.     ipratropium (ATROVENT) 0.03 % nasal spray Place 2 sprays into both nostrils every 12 (twelve) hours. 30 mL 12   Magnesium 400 MG TABS Take 1 tablet by mouth 2 (two) times daily.     metoprolol succinate (TOPROL-XL) 25 MG 24 hr tablet TAKE 1 TABLET BY MOUTH EVERY DAY 90 tablet 3   Multiple Vitamin (MULTIVITAMIN) tablet Take 1 tablet by mouth daily.     omeprazole (PRILOSEC) 40 MG capsule Take 40 mg by mouth daily.     pseudoephedrine (SUDAFED) 120 MG 12 hr tablet Take 120 mg by mouth daily.     Spacer/Aero-Holding Dorise Bullion Use with Albuterol inhaler 1 each  0   No current facility-administered medications on file prior to visit.    Review of Systems  Constitutional:  Negative for activity change, appetite change, fatigue, fever and unexpected weight change.  HENT:  Negative for congestion, ear pain, rhinorrhea, sinus pressure and sore throat.   Eyes:  Negative for pain, redness and visual disturbance.  Respiratory:  Negative for cough, shortness of breath and wheezing.   Cardiovascular:  Negative for chest pain and palpitations.  Gastrointestinal:  Negative for abdominal pain, blood in stool, constipation and diarrhea.  Endocrine: Negative for polydipsia and polyuria.  Genitourinary:  Negative for dysuria, frequency and urgency.  Musculoskeletal:  Negative for arthralgias, back pain and myalgias.  Skin:  Negative for pallor and rash.       lump  Allergic/Immunologic: Negative for environmental allergies.  Neurological:  Negative for dizziness, syncope and headaches.  Hematological:  Negative for adenopathy. Does not bruise/bleed easily.  Psychiatric/Behavioral:  Negative for decreased concentration and dysphoric mood. The patient is not nervous/anxious.       Objective:   Physical Exam Constitutional:      General: She is not in acute distress.    Appearance: Normal appearance. She is normal weight. She is not ill-appearing.  Eyes:     Conjunctiva/sclera: Conjunctivae normal.     Pupils: Pupils are equal, round, and reactive to light.  Cardiovascular:     Rate and Rhythm: Normal rate and regular rhythm.  Pulmonary:     Effort: Pulmonary effort is normal.  Skin:    General: Skin is warm and dry.     Coloration: Skin is not pale.     Findings: No bruising or rash.     Comments: 1.5 cm oval lump to L of anal cleft  Firm/not fluctuant  Small papule in center with scant erythema (per pt drained from there)  No hairs or flaking skin   Mildly tender if any  No active drainage/cannot express anything   Dimple above it noted (per pt  was worked up in the past and not pilonidal in nature)      Neurological:     Mental Status: She is alert.  Psychiatric:        Mood and Affect: Mood normal.          Assessment & Plan:   Problem List Items Addressed This Visit       Other  Lump of skin    There are some features of a pilonidal cyst  May be early infection in light of drainage at home Some ? Off poss mrsa in the past  inst to do warm compress  Doxycycline course prescribed  Rev call back and ER parameters and home care Update if not starting to improve in a week or if worsening

## 2021-12-10 NOTE — Assessment & Plan Note (Signed)
There are some features of a pilonidal cyst  May be early infection in light of drainage at home Some ? Off poss mrsa in the past  inst to do warm compress  Doxycycline course prescribed  Rev call back and ER parameters and home care Update if not starting to improve in a week or if worsening

## 2021-12-10 NOTE — Patient Instructions (Signed)
Use warm compresses on the lump as often as you can or a sitz bath  If it drains-that is good     Keep clean with soap and water  Cover loosely if needed  Antibiotic ointment is ok   Take doxycyline as directed   Watch for redness/increased size and pain   Update if not starting to improve in a week or if worsening

## 2021-12-16 ENCOUNTER — Encounter: Payer: Self-pay | Admitting: Family Medicine

## 2021-12-16 MED ORDER — AZITHROMYCIN 250 MG PO TABS: ORAL_TABLET | ORAL | 0 refills | Status: AC

## 2021-12-16 NOTE — Telephone Encounter (Signed)
I just went ahead and called [place] myself.  Worsening the last few days with progressive lower respiratory symptoms now with fever.  Covid neg x 3, with 3-4 days of symptoms. I think prudent to cover for atypical pna.  Zithromax 250 mg 2 tabs po on day 1, then 1 tab po for 4 days

## 2022-01-19 ENCOUNTER — Other Ambulatory Visit (HOSPITAL_COMMUNITY)
Admission: RE | Admit: 2022-01-19 | Discharge: 2022-01-19 | Disposition: A | Discharge status: Home / Self Care | Payer: BC Managed Care – PPO | Source: Ambulatory Visit | Attending: Obstetrics & Gynecology | Admitting: Obstetrics & Gynecology

## 2022-01-19 ENCOUNTER — Encounter: Payer: Self-pay | Admitting: Allergy & Immunology

## 2022-01-19 ENCOUNTER — Ambulatory Visit (INDEPENDENT_AMBULATORY_CARE_PROVIDER_SITE_OTHER): Payer: BC Managed Care – PPO | Admitting: Allergy & Immunology

## 2022-01-19 ENCOUNTER — Ambulatory Visit (INDEPENDENT_AMBULATORY_CARE_PROVIDER_SITE_OTHER): Payer: BC Managed Care – PPO | Admitting: Obstetrics & Gynecology

## 2022-01-19 ENCOUNTER — Encounter: Payer: Self-pay | Admitting: Obstetrics & Gynecology

## 2022-01-19 VITALS — BP 121/82 | HR 83 | Wt 153.4 lb

## 2022-01-19 VITALS — BP 124/86 | HR 82 | Temp 98.3°F | Resp 18 | Ht 64.0 in | Wt 155.1 lb

## 2022-01-19 DIAGNOSIS — B999 Unspecified infectious disease: Secondary | ICD-10-CM | POA: Diagnosis not present

## 2022-01-19 DIAGNOSIS — R053 Chronic cough: Secondary | ICD-10-CM

## 2022-01-19 DIAGNOSIS — J310 Chronic rhinitis: Secondary | ICD-10-CM | POA: Diagnosis not present

## 2022-01-19 DIAGNOSIS — Z1231 Encounter for screening mammogram for malignant neoplasm of breast: Secondary | ICD-10-CM | POA: Diagnosis not present

## 2022-01-19 DIAGNOSIS — Z01419 Encounter for gynecological examination (general) (routine) without abnormal findings: Secondary | ICD-10-CM | POA: Insufficient documentation

## 2022-01-19 DIAGNOSIS — R6882 Decreased libido: Secondary | ICD-10-CM

## 2022-01-19 DIAGNOSIS — N951 Menopausal and female climacteric states: Secondary | ICD-10-CM

## 2022-01-19 MED ORDER — GABAPENTIN 600 MG PO TABS: 600.0000 mg | ORAL_TABLET | Freq: Every day | ORAL | 3 refills | Status: DC

## 2022-01-19 NOTE — Progress Notes (Signed)
NEW PATIENT  Date of Service/Encounter:  01/19/22  Consult requested by: Owens Loffler, MD   Assessment:   No diagnosis found.  Plan/Recommendations:    There are no Patient Instructions on file for this visit.   {Blank single:19197::"This note in its entirety was forwarded to the Provider who requested this consultation."}  Subjective:   Katie Todd is a 61 y.o. female presenting today for evaluation of  Chief Complaint  Patient presents with  . Cough    Has been having a cough 15 years. Has been to a pulmonologist and they sent her here. Has nodules and since then she has had a cough.  -Had a stone in the lung and coughed it up years ago.   . Other    Fluid comes out of ear and nose. Is having puffy eyes.     Katie Todd has a history of the following: Patient Active Problem List   Diagnosis Date Noted  . Lump of skin 12/10/2021  . FHx: SVT (supraventricular tachycardia) 05/07/2020  . Family history of abdominal aortic aneurysm (AAA) x 2 d/c, Marfans 08/08/2018  . Chronic cough 03/21/2014  . Seasonal allergies 02/23/2013  . GERD (gastroesophageal reflux disease) 02/23/2013  . Anxiety state, unspecified 02/23/2013  . Multiple lung nodules on CT 12/09/2005  . History of chronic cough 09/10/2002    History obtained from: chart review and {Persons; PED relatives w/patient:19415::"patient"}.  Katie Todd was referred by Owens Loffler, MD.     Katie Todd is a 61 y.o. female presenting for an evaluation of a chronic cough . Seh has had nasal surgey for concho bullosa and deviated septum and turibnoplast. It helped for a couple of motnhs or so. This is when she lived in [place]. She was going to see a number of providers at [facility]. 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 There are pets in the home setting and there were a lot of fragrances. Coughing just got worse over the years until now when she  cannot be exposed to any kind of laundry detergents or anything. Her cough has been so bad that she cannot exercise. She finally went to see her PCP. She has had aspiration pneumonia from all of this on a few occasions.It is getting worse over time. She just gets through life with coughing. Cough is dry initially and then occasionally she coughs up something.  She did have some "testing" done by the ENT who did her nasal surgery. She moved here to avoid pollution in [place] that was triggering her symptoms. She has been here for 9 years and she has been able to identify things that irritate her airways. She has been on albuterol to use as needed.   She was changed to ipratropium which did cause some improvement. It seemed to work better with regards to her swelling and postnasal drip. She feels that she has fluid under the arms. She uses Benadryl nightly to get to sleep, she is unsure whether it helps. She has been on cetirizine 9-10 years or so. Prior to that, she was on Allegra. She has Sudafed that does help with the postnasal dripping; she does not use it daily because it causes insomnia and she has hypertension. She has Mucinex which does help some.   She has had histoplasmosis twice and has nodules. She reports that she gets antibiotics 2-3 times per year. Antibiotics always clear up the cough and the improvement remains  for 2-3 weeks after antibiotics stop. She has ended up in the hospital for an irregular heart beat. THis is why she is on the metoprolol. She has PVCs and PACs.    {Blank single:19197::"Asthma/Respiratory Symptom History: ***"," "}  {Blank single:19197::"Allergic Rhinitis Symptom History: ***"," "}  {Blank single:19197::"Food Allergy Symptom History: ***"," "}  {Blank single:19197::"Skin Symptom History: ***"," "}  {Blank single:19197::"GERD Symptom History: ***"," "}  ***Otherwise, there is no history of other atopic diseases, including {Blank  multiple:19196:o:"asthma","food allergies","drug allergies","environmental allergies","stinging insect allergies","eczema","urticaria","contact dermatitis"}. There is no significant infectious history. ***Vaccinations are up to date.    Past Medical History: Patient Active Problem List   Diagnosis Date Noted  . Lump of skin 12/10/2021  . FHx: SVT (supraventricular tachycardia) 05/07/2020  . Family history of abdominal aortic aneurysm (AAA) x 2 d/c, Mar[place]ans 08/08/2018  . Chronic cough 03/21/2014  . Seasonal allergies 02/23/2013  . GERD (gastroesophageal reflux disease) 02/23/2013  . Anxiety state, unspecified 02/23/2013  . Multiple lung nodules on CT 12/09/2005  . History of chronic cough 09/10/2002    Medication List:  Allergies as of 01/19/2022       Reactions   Penicillins Anaphylaxis        Medication List        Accurate as of January 19, 2022  9:49 AM. If you have any questions, ask your nurse or doctor.          albuterol 108 (90 Base) MCG/ACT inhaler Commonly known as: VENTOLIN HFA INHALE 2 PUFFS INTO THE LUNGS EVERY 4 HOURS AS NEEDED FOR WHEEZE   aspirin 81 MG chewable tablet Chew 81 mg by mouth daily.   Biotin 5000 MCG Tabs Take 2 tablets by mouth daily.   calcium carbonate 1250 MG capsule Take 1,250 mg by mouth 2 (two) times daily with a meal.   cetirizine 10 MG tablet Commonly known as: ZYRTEC Take 10 mg by mouth daily.   diclofenac 75 MG EC tablet Commonly known as: VOLTAREN Take 75 mg by mouth as needed.   diphenhydrAMINE 25 mg capsule Commonly known as: BENADRYL Take 50 mg by mouth every 6 (six) hours as needed.   docusate sodium 100 MG capsule Commonly known as: COLACE Take 200 mg by mouth 2 (two) times daily.   doxycycline 100 MG tablet Commonly known as: VIBRA-TABS Take 1 tablet (100 mg total) by mouth 2 (two) times daily.   doxylamine (Sleep) 25 MG tablet Commonly known as: UNISOM Take 25 mg by mouth at bedtime as needed.    fluticasone 50 MCG/ACT nasal spray Commonly known as: FLONASE SPRAY 2 SPRAYS IN EACH NOSTRIL NIGHTLY   ipratropium 0.03 % nasal spray Commonly known as: ATROVENT Place 2 sprays into both nostrils every 12 (twelve) hours.   Magnesium 400 MG Tabs Take 1 tablet by mouth 2 (two) times daily.   metoprolol succinate 25 MG 24 hr tablet Commonly known as: TOPROL-XL TAKE 1 TABLET BY MOUTH EVERY DAY   montelukast 10 MG tablet Commonly known as: SINGULAIR Take 10 mg by mouth at bedtime.   Mucinex Maximum Strength 1200 MG Tb12 Generic drug: Guaifenesin Take 1 tablet by mouth in the morning and at bedtime.   multivitamin tablet Take 1 tablet by mouth daily.   omeprazole 40 MG capsule Commonly known as: PRILOSEC Take 40 mg by mouth daily.   pseudoephedrine 120 MG 12 hr tablet Commonly known as: SUDAFED Take 120 mg by mouth daily.   ROBITUSSIN DM PO   Spacer/Aero-Holding [employer] Use  with Albuterol inhaler        Birth History: {Blank single:19197::"non-contributory","born premature and spent time in the NICU","born at term without complications"}  Developmental History: Willy has met all milestones on time. She has required no {Blank multiple:19196:a:"speech therapy","occupational therapy","physical therapy"}. ***non-contributory  Past Surgical History: Past Surgical History:  Procedure Laterality Date  . COLPOSCOPY  07/25/2008   Negative  . DILATION AND CURETTAGE OF UTERUS     missed ab  . NOSE SURGERY    . SINOSCOPY       Family History: Family History  Problem Relation Age of Onset  . Breast cancer Mother 62  . Allergic rhinitis Granddaughter      Social History: Donis lives at home with ***.    ROS     Objective:   Blood pressure 124/86, pulse 82, temperature 98.3 F (36.8 C), resp. rate 18, height '5\' 4"'  (1.626 m), weight 155 lb 2 oz (70.4 kg), last menstrual period 01/24/2014, SpO2 98 %. Body mass index is 26.63 kg/m.     Physical  Exam   Diagnostic studies: {Blank single:19197::"none","deferred due to recent antihistamine use","labs sent instead"," "}  Spirometry: {Blank single:19197::"results normal (FEV1: ***%, FVC: ***%, FEV1/FVC: ***%)","results abnormal (FEV1: ***%, FVC: ***%, FEV1/FVC: ***%)"}.    {Blank single:19197::"Spirometry consistent with mild obstructive disease","Spirometry consistent with moderate obstructive disease","Spirometry consistent with severe obstructive disease","Spirometry consistent with possible restrictive disease","Spirometry consistent with mixed obstructive and restrictive disease","Spirometry uninterpretable due to technique","Spirometry consistent with normal pattern"}. {Blank single:19197::"Albuterol/Atrovent nebulizer","Xopenex/Atrovent nebulizer","Albuterol nebulizer","Albuterol four puffs via MDI","Xopenex four puffs via MDI"} treatment given in clinic with {Blank single:19197::"significant improvement in FEV1 per ATS criteria","significant improvement in FVC per ATS criteria","significant improvement in FEV1 and FVC per ATS criteria","improvement in FEV1, but not significant per ATS criteria","improvement in FVC, but not significant per ATS criteria","improvement in FEV1 and FVC, but not significant per ATS criteria","no improvement"}.  Allergy Studies: {Blank single:19197::"none","labs sent instead"," "}    {Blank single:19197::"Allergy testing results were read and interpreted by myself, documented by clinical staff."," "}         Salvatore Marvel, MD [facility]

## 2022-01-19 NOTE — Progress Notes (Signed)
GYNECOLOGY ANNUAL PREVENTATIVE CARE ENCOUNTER NOTE  History:     Katie Todd is a 61 y.o. [phone] female here for a routine annual gynecologic exam.  Current complaints: menopausal night sweats and insomnia.  Also being evaluated for chronic cough for years, worsens at night.  Also reports decreased libido.   Denies abnormal vaginal bleeding, discharge, pelvic pain, other problems with intercourse or other gynecologic concerns.    Gynecologic History Patient's last menstrual period was 01/24/2014. Contraception: post menopausal status Last Pap: 05/15/2017. Result was normal with negative HPV Last Mammogram: 05/31/2017.  Result was normal Last Colonoscopy: 11/16/2010.  Result was normal. Had Cologuard 10/22/2021 that was negative.  Obstetric History OB History  Gravida Para Term Preterm AB Living  '5 1 1 '$ 0 4 1  SAB IAB Ectopic Multiple Live Births               # Outcome Date GA Lbr Len/2nd Weight Sex Delivery Anes PTL Lv  5 AB           4 AB           3 AB           2 AB           1 Term             Past Medical History:  Diagnosis Date   Anxiety state, unspecified 02/23/2013   GERD (gastroesophageal reflux disease) 02/23/2013   Histoplasmosis 2006   Insomnia    PAC (premature atrial contraction)    PVC (premature ventricular contraction)    Recurrent upper respiratory infection (URI)    Seasonal allergies 02/23/2013    Past Surgical History:  Procedure Laterality Date   COLPOSCOPY  07/25/2008   Negative   DILATION AND CURETTAGE OF UTERUS     missed ab   NOSE SURGERY     SINOSCOPY      Current Outpatient Medications on File Prior to Visit  Medication Sig Dispense Refill   aspirin 81 MG chewable tablet Chew 81 mg by mouth daily.     Biotin 5000 MCG TABS Take 2 tablets by mouth daily.     calcium carbonate 1250 MG capsule Take 1,250 mg by mouth 2 (two) times daily with a meal.     cetirizine (ZYRTEC) 10 MG tablet Take 10 mg by mouth daily.     docusate sodium  (COLACE) 100 MG capsule Take 200 mg by mouth 2 (two) times daily.     doxylamine, Sleep, (UNISOM) 25 MG tablet Take 25 mg by mouth at bedtime as needed.      ipratropium (ATROVENT) 0.03 % nasal spray Place 2 sprays into both nostrils every 12 (twelve) hours. 30 mL 12   Magnesium 400 MG TABS Take 1 tablet by mouth 2 (two) times daily.     metoprolol succinate (TOPROL-XL) 25 MG 24 hr tablet TAKE 1 TABLET BY MOUTH EVERY DAY 90 tablet 3   Multiple Vitamin (MULTIVITAMIN) tablet Take 1 tablet by mouth daily.     omeprazole (PRILOSEC) 40 MG capsule Take 40 mg by mouth daily.     albuterol (VENTOLIN HFA) 108 (90 Base) MCG/ACT inhaler INHALE 2 PUFFS INTO THE LUNGS EVERY 4 HOURS AS NEEDED FOR WHEEZE 8.5 each 2   Dextromethorphan-guaiFENesin (ROBITUSSIN DM PO)      diclofenac (VOLTAREN) 75 MG EC tablet Take 75 mg by mouth as needed.     diphenhydrAMINE (BENADRYL) 25 mg capsule Take 50 mg by mouth every 6 (  six) hours as needed.      Guaifenesin (MUCINEX MAXIMUM STRENGTH) 1200 MG TB12 Take 1 tablet by mouth in the morning and at bedtime.     pseudoephedrine (SUDAFED) 120 MG 12 hr tablet Take 120 mg by mouth daily.     Spacer/Aero-Holding Dorise Bullion Use with Albuterol inhaler 1 each 0   No current facility-administered medications on file prior to visit.    Allergies  Allergen Reactions   Penicillins Anaphylaxis    Social History:  reports that she quit smoking about 31 years ago. Her smoking use included cigarettes. She has never used smokeless tobacco. She reports that she does not currently use alcohol. She reports that she does not use drugs.  Family History  Problem Relation Age of Onset   Breast cancer Mother 48   Allergic rhinitis Granddaughter     The following portions of the patient's history were reviewed and updated as appropriate: allergies, current medications, past family history, past medical history, past social history, past surgical history and problem list.  Review of  Systems Pertinent items noted in HPI and remainder of comprehensive ROS otherwise negative.  Physical Exam:  BP 121/82   Pulse 83   Wt 153 lb 6.4 oz (69.6 kg)   LMP 01/24/2014 Comment: october 2014 was the one prior to this July  BMI 26.33 kg/m  CONSTITUTIONAL: Well-developed, well-nourished female in no acute distress.  HENT:  Normocephalic, atraumatic, External right and left ear normal.  EYES: Conjunctivae and EOM are normal. Pupils are equal, round, and reactive to light. No scleral icterus.  NECK: Normal range of motion, supple, no masses.  Normal thyroid.  SKIN: Skin is warm and dry. No rash noted. Not diaphoretic. No erythema. No pallor. MUSCULOSKELETAL: Normal range of motion. No tenderness.  No cyanosis, clubbing, or edema. NEUROLOGIC: Alert and oriented to person, place, and time. Normal reflexes, muscle tone coordination.  PSYCHIATRIC: Normal mood and affect. Normal behavior. Normal judgment and thought content. CARDIOVASCULAR: Normal heart rate noted, regular rhythm RESPIRATORY: Clear to auscultation bilaterally. Effort and breath sounds normal, no problems with respiration noted. BREASTS: Symmetric in size. No masses, tenderness, skin changes, nipple drainage, or lymphadenopathy bilaterally. Performed in the presence of a chaperone. ABDOMEN: Soft, no distention noted.  No tenderness, rebound or guarding.  PELVIC: Normal appearing external genitalia and urethral meatus; normal appearing vaginal mucosa and cervix.  Mild-moderate diffuse atrophy of vaginal and cervix. No abnormal vaginal discharge noted.  Pap smear obtained.  Normal uterine size, no other palpable masses, no uterine or adnexal tenderness.  Performed in the presence of a chaperone.   Assessment and Plan:     1. Menopausal night sweats Discussed lifestyle intervention changes (sleeping with low temperatures, less clothes,etc which she is already doing).  Discussed trial of Neurontin, this may also help with her  chronic cough.  Will monitor response. - gabapentin (NEURONTIN) 600 MG tablet; Take 1 tablet (600 mg total) by mouth at bedtime.  Dispense: 30 tablet; Refill: 3  2. Decreased libido Recommended trial of OTC Scream creams. Hesitant to prescribe postmenopausal HRT/testosterone at this time due to risks, can re-consider later.   3. Breast cancer screening by mammogram Mammogram scheduled - MM 3D SCREEN BREAST BILATERAL; Future  4. Encounter for gynecological examination with Papanicolaou smear of cervix - Cytology - PAP Will follow up results of pap smear and manage accordingly. Colon cancer screening is up to date. Routine preventative health maintenance measures emphasized. Please refer to After Visit Summary for other  counseling recommendations.      Verita Schneiders, MD, [place] [facility] [employer], [place]

## 2022-01-19 NOTE — Patient Instructions (Addendum)
1. Chronic cough - Lung testing not done since you had this done in May. - I agree that this does not sound like asthma. - Continue with follow up with Dr. Loanne Drilling.  - Agree with gabapentin trial if no improvement at the next visit.   2. Chronic rhinitis - Testing today showed: cockroach (and even this was VERY small) - Copy of test results provided.  - Avoidance measures provided. - Continue with: Atrovent one spray per nostril twice daily - Start taking: Ryvent (carbinoxamine) 4 mg tablet 3 times daily (start low and go slow, it can cause sleepiness; this could replace your nighttime Benadryl if the sleepiness is a strong effect).  - You can use an extra dose of the antihistamine, if needed, for breakthrough symptoms.   3. Recurrent infections - We will obtain some screening labs to evaluate your immune system.  - Labs to evaluate the quantitative [facility]) aspects of your immune system: IgG/IgA/IgM, CBC with differential - Labs to evaluate the qualitative ([place]) aspects of your immune system: CH50, Pneumococcal titers, Tetanus titers, Diphtheria titers - We may consider immunizations with Pneumovax and Tdap to challenge your immune system, and then obtain repeat titers in 4-6 weeks.  - We will call you in 1-2 weeks with the results of the testing.   4. Return in about 6 weeks (around 03/02/2022).    Please inform us of any Emergency Department visits, hospitalizations, or changes in symptoms. Call us before going to the ED for breathing or allergy symptoms since we might be able to fit you in for a sick visit. Feel free to contact us anytime with any questions, problems, or concerns.  It was a pleasure to meet you today!  Websites that have reliable patient information: 1. American Academy of Asthma, Allergy, and Immunology: www.aaaai.org 2. Food Allergy Research and Education (FARE): foodallergy.org 3. Mothers of Asthmatics: http://www.asthmacommunitynetwork.org 4.  American College of Allergy, Asthma, and Immunology: www.acaai.org   COVID-19 Vaccine Information can be found at: ShippingScam.co.uk For questions related to vaccine distribution or appointments, please email [email or web address] or call [phone].   We realize that you might be concerned about having an allergic reaction to the COVID19 vaccines. To help with that concern, WE ARE OFFERING THE COVID19 VACCINES IN OUR OFFICE! Ask the front desk for dates!     "Like" [place] on Facebook and Instagram for our latest updates!      A healthy democracy works best when [employer] participate! Make sure you are registered to vote! If you have moved or changed any of your contact information, you will need to get this updated before voting!  In some cases, you MAY be able to register to vote online: [email or web address]      Airborne Adult Perc - 01/19/22 1015     Time Antigen Placed 1015    Allergen Manufacturer Lavella Hammock    Location Back    Number of Test 59    Panel 1 Select    1. Control-Buffer 50% Glycerol Negative    2. Control-Histamine 1 mg/ml 2+    3. Albumin saline Negative    4. Burrton Negative    5. [place] Negative    6. Johnson Negative    7. Berkshire Blue Negative    8. Meadow Fescue Negative    9. Perennial Rye Negative    10. Sweet Vernal Negative    11. Timothy Negative    12. Cocklebur Negative    13. Burweed Marshelder Negative  14. Ragweed, short Negative    15. Ragweed, Giant Negative    16. Plantain,  English Negative    17. Lamb's Quarters Negative    18. Sheep Sorrell Negative    19. Rough Pigweed Negative    20. Marsh Elder, Rough Negative    21. Mugwort, Common Negative    22. Ash mix Negative    23. Birch mix Negative    24. Beech American Negative    25. Box, Elder Negative    26. Cedar, red Negative    27. Cottonwood, [place] Negative    28. Elm mix Negative     29. Hickory Negative    30. Maple mix Negative    31. Oak, [place] mix Negative    32. Pecan Pollen Negative    33. Pine mix Negative    34. Sycamore Eastern Negative    35. [place], Black Pollen Negative    36. Alternaria alternata Negative    37. Cladosporium Herbarum Negative    38. Aspergillus mix Negative    39. Penicillium mix Negative    40. Bipolaris sorokiniana (Helminthosporium) Negative    41. Drechslera spicifera (Curvularia) Negative    42. Mucor plumbeus Negative    43. Fusarium moniliforme Negative    44. Aureobasidium pullulans (pullulara) Negative    45. Rhizopus oryzae Negative    46. Botrytis cinera Negative    47. Epicoccum nigrum Negative    48. Phoma betae Negative    49. Candida Albicans Negative    50. Trichophyton mentagrophytes Negative    51. Mite, D Farinae  5,000 AU/ml Negative    52. Mite, D Pteronyssinus  5,000 AU/ml Negative    53. Cat Hair 10,000 BAU/ml Negative    54.  Dog Epithelia Negative    55. Mixed Feathers Negative    56. Horse Epithelia Negative    57. Cockroach, German Negative    58. Mouse Negative    59. Tobacco Leaf Negative             Intradermal - 01/19/22 1133     Time Antigen Placed 1115    Allergen Manufacturer Lavella Hammock    Location Arm    Number of Test 15    Intradermal Select    Control Negative    Guatemala Negative    Johnson Negative    7 Grass Negative    Ragweed mix Negative    Weed mix Negative    Tree mix Negative    Mold 1 Negative    Mold 2 Negative    Mold 3 Negative    Mold 4 Negative    Cat Negative    Dog Negative    Cockroach 1+    Mite mix Negative             Control of Cockroach Allergen  Cockroach allergen has been identified as an important cause of acute attacks of asthma, especially in urban settings.  There are fifty-five species of cockroach that exist in the [place], however only three, the [place], [place] species produce allergen that can affect patients  with Asthma.  Allergens can be obtained from fecal particles, egg casings and secretions from cockroaches.    Remove food sources. Reduce access to water. Seal access and entry points. Spray runways with 0.5-1% Diazinon or Chlorpyrifos Blow boric acid power under stoves and refrigerator. Place bait stations (hydramethylnon) at feeding sites.

## 2022-01-20 ENCOUNTER — Other Ambulatory Visit: Payer: Self-pay

## 2022-01-20 ENCOUNTER — Encounter: Payer: Self-pay | Admitting: Allergy & Immunology

## 2022-01-20 MED ORDER — CARBINOXAMINE MALEATE 4 MG PO TABS
4.0000 mg | ORAL_TABLET | Freq: Three times a day (TID) | ORAL | 5 refills | Status: DC | PRN
Start: 2022-01-20 — End: 2022-03-21

## 2022-01-22 LAB — STREP PNEUMONIAE 23 SEROTYPES IGG
Pneumo Ab Type 1*: 0.7 ug/mL — ABNORMAL LOW (ref >1.3)
Pneumo Ab Type 12 (12F)*: 0.5 ug/mL — ABNORMAL LOW (ref >1.3)
Pneumo Ab Type 14*: 4 ug/mL (ref >1.3)
Pneumo Ab Type 17 (17F)*: 2.4 ug/mL (ref >1.3)
Pneumo Ab Type 19 (19F)*: 2 ug/mL (ref >1.3)
Pneumo Ab Type 2*: 1.4 ug/mL (ref >1.3)
Pneumo Ab Type 20*: 3.6 ug/mL (ref >1.3)
Pneumo Ab Type 22 (22F)*: 0.1 ug/mL — ABNORMAL LOW (ref >1.3)
Pneumo Ab Type 23 (23F)*: <0.1 ug/mL — ABNORMAL LOW (ref >1.3)
Pneumo Ab Type 26 (6B)*: <0.1 ug/mL — ABNORMAL LOW (ref >1.3)
Pneumo Ab Type 3*: 0.3 ug/mL — ABNORMAL LOW (ref >1.3)
Pneumo Ab Type 34 (10A)*: 1.1 ug/mL — ABNORMAL LOW (ref >1.3)
Pneumo Ab Type 4*: <0.1 ug/mL — ABNORMAL LOW (ref >1.3)
Pneumo Ab Type 43 (11A)*: 0.2 ug/mL — ABNORMAL LOW (ref >1.3)
Pneumo Ab Type 5*: 0.3 ug/mL — ABNORMAL LOW (ref >1.3)
Pneumo Ab Type 51 (7F)*: 0.4 ug/mL — ABNORMAL LOW (ref >1.3)
Pneumo Ab Type 54 (15B)*: 0.4 ug/mL — ABNORMAL LOW (ref >1.3)
Pneumo Ab Type 56 (18C)*: 0.8 ug/mL — ABNORMAL LOW (ref >1.3)
Pneumo Ab Type 57 (19A)*: 0.2 ug/mL — ABNORMAL LOW (ref >1.3)
Pneumo Ab Type 68 (9V)*: 1.3 ug/mL — ABNORMAL LOW (ref >1.3)
Pneumo Ab Type 70 (33F)*: 7.3 ug/mL (ref >1.3)
Pneumo Ab Type 8*: 0.5 ug/mL — ABNORMAL LOW (ref >1.3)
Pneumo Ab Type 9 (9N)*: 0.7 ug/mL — ABNORMAL LOW (ref >1.3)

## 2022-01-22 LAB — IGG, IGA, IGM
IgA/Immunoglobulin A, Serum: 55 mg/dL — ABNORMAL LOW (ref 87–352)
IgG (Immunoglobin G), Serum: 805 mg/dL (ref 586–1602)
IgM (Immunoglobulin M), Srm: 78 mg/dL (ref 26–217)

## 2022-01-22 LAB — CBC WITH DIFFERENTIAL
Basophils Absolute: 0.1 10*3/uL (ref 0.0–0.2)
Basos: 1 %
EOS (ABSOLUTE): 0 10*3/uL (ref 0.0–0.4)
Eos: 1 %
Hematocrit: 38.5 % (ref 34.0–46.6)
Hemoglobin: 13.2 g/dL (ref 11.1–15.9)
Immature Grans (Abs): 0 10*3/uL (ref 0.0–0.1)
Immature Granulocytes: 0 %
Lymphocytes Absolute: 2 10*3/uL (ref 0.7–3.1)
Lymphs: 31 %
MCH: 30.9 pg (ref 26.6–33.0)
MCHC: 34.3 g/dL (ref 31.5–35.7)
MCV: 90 fL (ref 79–97)
Monocytes Absolute: 0.6 10*3/uL (ref 0.1–0.9)
Monocytes: 9 %
Neutrophils Absolute: 3.7 10*3/uL (ref 1.4–7.0)
Neutrophils: 58 %
RBC: 4.27 x10E6/uL (ref 3.77–5.28)
RDW: 12.4 % (ref 11.7–15.4)
WBC: 6.4 10*3/uL (ref 3.4–10.8)

## 2022-01-22 LAB — DIPHTHERIA / TETANUS ANTIBODY PANEL
Diphtheria Ab: >3 IU/mL (ref <0.10)
Tetanus Ab, IgG: 4.79 IU/mL (ref <0.10)

## 2022-01-22 LAB — COMPLEMENT, TOTAL: Compl, Total (CH50): >60 U/mL (ref >41)

## 2022-01-24 LAB — CYTOLOGY - PAP
Comment: NEGATIVE
Diagnosis: NEGATIVE
High risk HPV: NEGATIVE

## 2022-01-31 ENCOUNTER — Ambulatory Visit (INDEPENDENT_AMBULATORY_CARE_PROVIDER_SITE_OTHER): Payer: BC Managed Care – PPO | Admitting: Family Medicine

## 2022-01-31 ENCOUNTER — Encounter: Payer: Self-pay | Admitting: Family Medicine

## 2022-01-31 VITALS — BP 100/62 | HR 79 | Temp 98.3°F | Ht 64.5 in | Wt 152.1 lb

## 2022-01-31 DIAGNOSIS — R229 Localized swelling, mass and lump, unspecified: Secondary | ICD-10-CM

## 2022-01-31 DIAGNOSIS — K529 Noninfective gastroenteritis and colitis, unspecified: Secondary | ICD-10-CM

## 2022-01-31 DIAGNOSIS — R198 Other specified symptoms and signs involving the digestive system and abdomen: Secondary | ICD-10-CM | POA: Diagnosis not present

## 2022-01-31 DIAGNOSIS — K5909 Other constipation: Secondary | ICD-10-CM | POA: Diagnosis not present

## 2022-01-31 MED ORDER — SULFAMETHOXAZOLE-TRIMETHOPRIM 800-160 MG PO TABS: 2.0000 | ORAL_TABLET | Freq: Two times a day (BID) | ORAL | 0 refills | Status: DC

## 2022-01-31 NOTE — Progress Notes (Unsigned)
Katie Bracewell T. Danny Zimny, MD, [place] at [facility] [place], [address]  Phone: [phone]  FAX: [phone]  Katie Todd - 61 y.o. female  MRN [id number]  Date of Birth: [birth year]  Date: 01/31/2022  PCP: Owens Loffler, MD  Referral: Owens Loffler, MD  Chief Complaint  Patient presents with   Abnormal Bowel Movements    GYN wants patient to get colonoscopy even with negative Cologuard.  Wants PCP to put in referral   Rectal Abscess    Seen by Dr Glori Bickers 12/10/21 that never completely healed   Subjective:   Katie Todd is a 61 y.o. very pleasant female patient with Body mass index is 25.71 kg/m. who presents with the following:  She had an abscess just lateral to the gluteal cleft and was treated with doxycycline 3 weeks ago.  Chronic cough and fatigue update: Pulm changed to a different nasal spray.  Allergist took off of everything.  - gabapentin helping with sleep and coughing.  Much better in a week. Feeling much better.   Intermittent constipation - ribbon shaped stool - flat and in a ribbon.    - in the past did have colonoscopy, some difficulty and no anesthesia. Intermittent constipation and diarrhea going on for years.  Abscess did get bigger - lump has gotten a little bit bigger.   Mom - may have had UC, not sure? At least, she was told that she had colitis. No history of crohns. No family history of celiac disease    Immunization History  Administered Date(s) Administered   Influenza,inj,Quad PF,6+ Mos 05/15/2017, 06/08/2018, 04/09/2019, 03/17/2020   Influenza-Unspecified 05/17/2021   PFIZER(Purple Top)SARS-COV-2 Vaccination 10/02/2019, 10/31/2019, 06/04/2020   Pfizer Covid-19 Vaccine Bivalent Booster 2yr & up 05/17/2021   Tdap 09/15/2016     Review of Systems is noted in the HPI, as appropriate  Objective:   BP 100/62   Pulse 79   Temp 98.3 F (36.8 C) (Oral)   Ht 5' 4.5"  (1.638 m)   Wt 152 lb 2 oz (69 kg)   LMP 01/24/2014 Comment: october 2014 was the one prior to this July  SpO2 96%   BMI 25.71 kg/m   GEN: No acute distress; alert,appropriate. PULM: Breathing comfortably in no respiratory distress PSYCH: Normally interactive.   This portion of the physical examination was chaperoned by DHedy Camara CMA.  Just l and lateral to the gluteal cleft there is a small area of fluctuance with minor redness about the area.   Laboratory and Imaging Data:  Assessment and Plan:     ICD-10-CM   1. Chronic constipation  K59.09 Ambulatory referral to Gastroenterology    2. Chronic diarrhea of unknown origin  K52.9 Ambulatory referral to Gastroenterology    3. Abnormal bowel movement  R19.8 Ambulatory referral to Gastroenterology     Small area, may be remaining small abscess, will treat with Septra DS x 10 days for cellulitis and MRSA coverage.  Chronic diarrhea and constipation for years, now I am shown a ribbon-like stool via picture.  Recent normal cologuard, but no recent colonoscopy.  ? Family history of colitis vs UC, but not entirely sure.  Without a prior GI work-up, I think it is reasonable to get their consultation.  Medication Management during today's office visit: Meds ordered this encounter  Medications   sulfamethoxazole-trimethoprim (BACTRIM DS) 800-160 MG tablet    Sig: Take 2 tablets by mouth 2 (two) times daily.  Dispense:  20 tablet    Refill:  0   Medications Discontinued During This Encounter  Medication Reason   diclofenac (VOLTAREN) 75 MG EC tablet Completed Course   diphenhydrAMINE (BENADRYL) 25 mg capsule Completed Course   doxylamine, Sleep, (UNISOM) 25 MG tablet Completed Course   Guaifenesin (MUCINEX MAXIMUM STRENGTH) 1200 MG TB12 Completed Course   pseudoephedrine (SUDAFED) 120 MG 12 hr tablet Completed Course    Orders placed today for conditions managed today: Orders Placed This Encounter  Procedures   Ambulatory  referral to Gastroenterology    Follow-up if needed: No follow-ups on file.  Dragon [employer] One speech-to-text software was used for transcription in this dictation.  Possible transcriptional errors can occur using Editor, commissioning.   Signed,  Maud Deed. Jamarious Febo, MD   Outpatient Encounter Medications as of 01/31/2022  Medication Sig   albuterol (VENTOLIN HFA) 108 (90 Base) MCG/ACT inhaler INHALE 2 PUFFS INTO THE LUNGS EVERY 4 HOURS AS NEEDED FOR WHEEZE   aspirin 81 MG chewable tablet Chew 81 mg by mouth daily.   Biotin 5000 MCG TABS Take 2 tablets by mouth daily.   calcium carbonate 1250 MG capsule Take 1,250 mg by mouth 2 (two) times daily with a meal.   cetirizine (ZYRTEC) 10 MG tablet Take 10 mg by mouth daily.   Dextromethorphan-guaiFENesin (ROBITUSSIN DM PO)    docusate sodium (COLACE) 100 MG capsule Take 200 mg by mouth 2 (two) times daily.   gabapentin (NEURONTIN) 600 MG tablet Take 1 tablet (600 mg total) by mouth at bedtime.   ipratropium (ATROVENT) 0.03 % nasal spray Place 2 sprays into both nostrils every 12 (twelve) hours.   Magnesium 400 MG TABS Take 1 tablet by mouth 2 (two) times daily.   metoprolol succinate (TOPROL-XL) 25 MG 24 hr tablet TAKE 1 TABLET BY MOUTH EVERY DAY   Multiple Vitamin (MULTIVITAMIN) tablet Take 1 tablet by mouth daily.   omeprazole (PRILOSEC) 40 MG capsule Take 40 mg by mouth daily.   Spacer/Aero-Holding Dorise Bullion Use with Albuterol inhaler   sulfamethoxazole-trimethoprim (BACTRIM DS) 800-160 MG tablet Take 2 tablets by mouth 2 (two) times daily.   Carbinoxamine Maleate 4 MG TABS Take 1 tablet (4 mg total) by mouth 3 (three) times daily as needed. (Patient not taking: Reported on 01/31/2022)   [DISCONTINUED] diclofenac (VOLTAREN) 75 MG EC tablet Take 75 mg by mouth as needed.   [DISCONTINUED] diphenhydrAMINE (BENADRYL) 25 mg capsule Take 50 mg by mouth every 6 (six) hours as needed.    [DISCONTINUED] doxylamine, Sleep, (UNISOM) 25 MG tablet Take  25 mg by mouth at bedtime as needed.    [DISCONTINUED] Guaifenesin (MUCINEX MAXIMUM STRENGTH) 1200 MG TB12 Take 1 tablet by mouth in the morning and at bedtime.   [DISCONTINUED] pseudoephedrine (SUDAFED) 120 MG 12 hr tablet Take 120 mg by mouth daily.   No facility-administered encounter medications on file as of 01/31/2022.

## 2022-02-07 ENCOUNTER — Encounter: Payer: Self-pay | Admitting: Allergy & Immunology

## 2022-02-14 ENCOUNTER — Ambulatory Visit
Admission: RE | Admit: 2022-02-14 | Discharge: 2022-02-14 | Disposition: A | Discharge status: Home / Self Care | Payer: BC Managed Care – PPO | Source: Ambulatory Visit | Attending: Obstetrics & Gynecology | Admitting: Obstetrics & Gynecology

## 2022-02-14 DIAGNOSIS — Z1231 Encounter for screening mammogram for malignant neoplasm of breast: Secondary | ICD-10-CM | POA: Diagnosis not present

## 2022-02-22 DIAGNOSIS — K219 Gastro-esophageal reflux disease without esophagitis: Secondary | ICD-10-CM | POA: Diagnosis not present

## 2022-02-22 DIAGNOSIS — R140 Abdominal distension (gaseous): Secondary | ICD-10-CM | POA: Diagnosis not present

## 2022-02-22 DIAGNOSIS — K5904 Chronic idiopathic constipation: Secondary | ICD-10-CM | POA: Diagnosis not present

## 2022-02-22 DIAGNOSIS — Z1211 Encounter for screening for malignant neoplasm of colon: Secondary | ICD-10-CM | POA: Diagnosis not present

## 2022-03-02 ENCOUNTER — Ambulatory Visit: Payer: BC Managed Care – PPO | Admitting: Family

## 2022-03-09 ENCOUNTER — Ambulatory Visit: Payer: BC Managed Care – PPO | Admitting: Family

## 2022-03-19 ENCOUNTER — Other Ambulatory Visit: Payer: Self-pay | Admitting: Cardiology

## 2022-03-21 ENCOUNTER — Ambulatory Visit (INDEPENDENT_AMBULATORY_CARE_PROVIDER_SITE_OTHER): Payer: BC Managed Care – PPO | Admitting: Family Medicine

## 2022-03-21 ENCOUNTER — Encounter: Payer: Self-pay | Admitting: Family Medicine

## 2022-03-21 ENCOUNTER — Ambulatory Visit (INDEPENDENT_AMBULATORY_CARE_PROVIDER_SITE_OTHER)
Admission: RE | Admit: 2022-03-21 | Discharge: 2022-03-21 | Disposition: A | Discharge status: Home / Self Care | Payer: BC Managed Care – PPO | Source: Ambulatory Visit | Attending: Family Medicine | Admitting: Family Medicine

## 2022-03-21 VITALS — BP 110/70 | HR 59 | Temp 98.1°F | Ht 64.5 in | Wt 153.0 lb

## 2022-03-21 DIAGNOSIS — S99922A Unspecified injury of left foot, initial encounter: Secondary | ICD-10-CM | POA: Diagnosis not present

## 2022-03-21 DIAGNOSIS — Z1211 Encounter for screening for malignant neoplasm of colon: Secondary | ICD-10-CM

## 2022-03-21 DIAGNOSIS — M2012 Hallux valgus (acquired), left foot: Secondary | ICD-10-CM | POA: Diagnosis not present

## 2022-03-21 NOTE — Progress Notes (Signed)
Katie Reiger T. [name], MD, [place] at [facility] [place], [address]  Phone: [phone]  FAX: [phone]  Katie Todd - 61 y.o. female  MRN [id number]  Date of Birth: 1960/08/08  Date: 03/21/2022  PCP: Owens Loffler, MD  Referral: Owens Loffler, MD  Chief Complaint  Patient presents with   Toe Injury    Left Big Toe   Subjective:   Katie Todd is a 61 y.o. very pleasant female patient with Body mass index is 25.86 kg/m. who presents with the following:  IP joint sprain: She tripped on the stairs going upward and struck her toe at the movies.  Since then, she has developed pain at the 1st digit of the foot on the L.  Pain is mostly at the IP joint. Minimal bruising.  Also questions about colon cancer screening and would like a second opinion after seeing Dr. Collene Mares.  No colonoscopy in > 10 years, and initially, it was recommended that she have a repeat colonoscopy 1 year after the original.    Review of Systems is noted in the HPI, as appropriate  Objective:   BP 110/70   Pulse (!) 59   Temp 98.1 F (36.7 C) (Oral)   Ht 5' 4.5" (1.638 m)   Wt 153 lb (69.4 kg)   LMP 01/24/2014 Comment: october 2014 was the one prior to this July  SpO2 98%   BMI 25.86 kg/m   GEN: No acute distress; alert,appropriate. PULM: Breathing comfortably in no respiratory distress PSYCH: Normally interactive.   There is tenderness at the IP joint, and pain with varus and valgus stress.  There is no pain at the sesamoids.  No pain at the MTP joint.  No tenderness at the distal tip.  No tenderness along all metatarsals, and the remainder of the forefoot, midfoot, and hindfoot.  Nontender at the ankle.  Laboratory and Imaging Data:  Assessment and Plan:     ICD-10-CM   1. Toe injury, left, initial encounter  J18.841Y DG Foot Complete Left    2. Screening for malignant neoplasm of colon  Z12.11 Ambulatory referral to  Gastroenterology     IP sprain.  I would only give this a little bit of extra time before returning back to full activity, she already has some excellent supportive shoes.  I would hold off over the next few days of working out and then resume over the weekend.  I think it is also reasonable to get a second opinion about screening colonoscopy.  Medication Management during today's office visit: No orders of the defined types were placed in this encounter.  Medications Discontinued During This Encounter  Medication Reason   Carbinoxamine Maleate 4 MG TABS Completed Course   cetirizine (ZYRTEC) 10 MG tablet Completed Course   docusate sodium (COLACE) 100 MG capsule Completed Course   Magnesium 400 MG TABS Completed Course   sulfamethoxazole-trimethoprim (BACTRIM DS) 800-160 MG tablet Completed Course   omeprazole (PRILOSEC) 40 MG capsule Change in therapy    Orders placed today for conditions managed today: Orders Placed This Encounter  Procedures   DG Foot Complete Left   Ambulatory referral to Gastroenterology    Disposition: No follow-ups on file.  Dragon [employer] One speech-to-text software was used for transcription in this dictation.  Possible transcriptional errors can occur using Editor, commissioning.   Signed,  Maud Deed. Issabella Rix, MD   Outpatient Encounter Medications as of 03/21/2022  Medication Sig  albuterol (VENTOLIN HFA) 108 (90 Base) MCG/ACT inhaler INHALE 2 PUFFS INTO THE LUNGS EVERY 4 HOURS AS NEEDED FOR WHEEZE   aspirin 81 MG chewable tablet Chew 81 mg by mouth daily.   Biotin 5000 MCG TABS Take 2 tablets by mouth daily.   calcium carbonate 1250 MG capsule Take 1,250 mg by mouth 2 (two) times daily with a meal.   Dextromethorphan-guaiFENesin (ROBITUSSIN DM PO) Take 20 mLs by mouth daily as needed.   gabapentin (NEURONTIN) 600 MG tablet Take 1 tablet (600 mg total) by mouth at bedtime.   ipratropium (ATROVENT) 0.03 % nasal spray Place 2 sprays into both nostrils  every 12 (twelve) hours.   LINZESS 290 MCG CAPS capsule Take 290 mcg by mouth every morning.   Multiple Vitamin (MULTIVITAMIN) tablet Take 1 tablet by mouth daily.   omeprazole (PRILOSEC) 20 MG capsule Take 20 mg by mouth daily.   Spacer/Aero-Holding Chambers DEVI Use with Albuterol inhaler   [DISCONTINUED] metoprolol succinate (TOPROL-XL) 25 MG 24 hr tablet TAKE 1 TABLET BY MOUTH EVERY DAY   [DISCONTINUED] Carbinoxamine Maleate 4 MG TABS Take 1 tablet (4 mg total) by mouth 3 (three) times daily as needed. (Patient not taking: Reported on 01/31/2022)   [DISCONTINUED] cetirizine (ZYRTEC) 10 MG tablet Take 10 mg by mouth daily.   [DISCONTINUED] docusate sodium (COLACE) 100 MG capsule Take 200 mg by mouth 2 (two) times daily.   [DISCONTINUED] Magnesium 400 MG TABS Take 1 tablet by mouth 2 (two) times daily.   [DISCONTINUED] omeprazole (PRILOSEC) 40 MG capsule Take 40 mg by mouth daily.   [DISCONTINUED] sulfamethoxazole-trimethoprim (BACTRIM DS) 800-160 MG tablet Take 2 tablets by mouth 2 (two) times daily.   No facility-administered encounter medications on file as of 03/21/2022.

## 2022-03-22 ENCOUNTER — Encounter: Payer: BC Managed Care – PPO | Admitting: Obstetrics & Gynecology

## 2022-04-12 NOTE — Patient Instructions (Incomplete)
1. Chronic cough -Full PFT in May showed no obstruction and restriction - I agree that this does not sound like asthma. - Continue gabapentin as per OBGYN  2. Chronic rhinitis - Testing on 01/19/22 was positive to: cockroach (and even this was VERY small) -continue avoidance measures against cockroach - Continue with: Atrovent one spray per nostril twice daily - Start taking: Ryvent (carbinoxamine) 4 mg tablet 3 times daily (start low and go slow, it can cause sleepiness; this could replace your nighttime Benadryl if the sleepiness is a strong effect).  - You can use an extra dose of the antihistamine, if needed, for breakthrough symptoms.   3. Recurrent infections - keep track of current infections and antibiotics - get Pneumovax and repeat pneumococcal titers 4-6 weeks later  4. Schedule a follow up appointment in months        Control of Cockroach Allergen  Cockroach allergen has been identified as an important cause of acute attacks of asthma, especially in urban settings.  There are fifty-five species of cockroach that exist in the [place], however only three, the [place], [place] species produce allergen that can affect patients with Asthma.  Allergens can be obtained from fecal particles, egg casings and secretions from cockroaches.    Remove food sources. Reduce access to water. Seal access and entry points. Spray runways with 0.5-1% Diazinon or Chlorpyrifos Blow boric acid power under stoves and refrigerator. Place bait stations (hydramethylnon) at feeding sites.

## 2022-04-13 ENCOUNTER — Encounter: Payer: Self-pay | Admitting: Family

## 2022-04-13 ENCOUNTER — Other Ambulatory Visit: Payer: Self-pay

## 2022-04-13 ENCOUNTER — Ambulatory Visit (INDEPENDENT_AMBULATORY_CARE_PROVIDER_SITE_OTHER): Payer: BC Managed Care – PPO | Admitting: Family

## 2022-04-13 VITALS — BP 122/80 | HR 67 | Resp 17

## 2022-04-13 DIAGNOSIS — B999 Unspecified infectious disease: Secondary | ICD-10-CM

## 2022-04-13 DIAGNOSIS — J310 Chronic rhinitis: Secondary | ICD-10-CM

## 2022-04-13 DIAGNOSIS — R053 Chronic cough: Secondary | ICD-10-CM | POA: Diagnosis not present

## 2022-04-13 MED ORDER — GABAPENTIN 100 MG PO CAPS: 100.0000 mg | ORAL_CAPSULE | Freq: Every morning | ORAL | 0 refills | Status: DC

## 2022-04-13 NOTE — Progress Notes (Signed)
[place], [address] [place] [address] Dept: [phone]  FOLLOW UP NOTE  Patient ID: Katie Todd, female    DOB: June 10, 1961  Age: 61 y.o. 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  She was last seen on January 19, 2022 by Dr. Ernst Bowler.  She denies any new diagnosis or surgery since her last office visit.  Chronic cough: She continues to have chronic cough, but she is no longer coughing at night due to her OB/GYN  starting her on gabapentin 600 mg at night for  hot flashes.  The gabapentin does not make her sleepy.  She is requesting additional gabapentin to help her not cough during the day as discussed with Dr. Ernst Bowler at the last office visit.  The cough has been going on for 15 years and she has previously been to [facility] and has been sent back and forth between ear nose and throat and pulmonology.  She mentions that she has nodules in her lungs and has had histoplasmosis twice.  The cough is nonproductive.  She will have shortness of breath only if coughing for days.  When she had shortness of breath she did use her albuterol and it helped some, but it did not stop the cough.  She denies fever, chills wheezing, tightness in chest, and nocturnal awakenings due to breathing problems.  She has not seen Dr. Loanne Drilling, her pulmonologist, since her last office visit with [place].  She had a full pulmonary function test on Nov 26, 2021 that showed no obstructive or restrictive lung disease.  She mentions that this was the second time that she has been tested for asthma.  She thinks that she has silent reflux and heartburn.  She is currently on omeprazole.  Her symptoms are not all the time.  When she  does have symptoms she will take Gaviscon or Tums.  Chronic rhinitis with minor sensitization to cockroach only.  She reports clear rhinorrhea, postnasal drip, and nasal congestion that has not been nearly as bad since using Atrovent nasal spray.  She does mention that Atrovent nasal spray has really helped with her drippy nose.  She has not had any sinus infections since we last saw her.  She has tried Flonase in the past and it did not do anything, but make her sneeze.  She is no longer taking RyVent because only 1 tablet once a day dried her out too much.  She does not like to take medications and is trying not to take anything.  She has tried Zyrtec, Human resources officer, Xyzal, and Claritin in the past.  She does mention that she does take Robitussin.  She reports that she has had nasal surgery in the past for deviated septum, concha bullosa, and turbinoplasty.  She thinks it was around 2007.  Recurrent infections: She did get her Pneumovax 6 weeks ago and will get the repeat pneumococcal titers soon.  She has not had any infections since her last appointment.   Drug Allergies:  Allergies  Allergen Reactions   Penicillins Anaphylaxis    Review of Systems: Review of Systems  Constitutional:  Negative for chills and fever.  HENT:         Reports clear rhinorrhea, postnasal drip, nasal congestion that is not nearly  as bad.  Eyes:        Reports watery eyes that are not as bad since starting ipratropium bromide nasal spray.  Denies itchy eyes  Respiratory:  Positive for cough and shortness of breath. Negative for wheezing.        Reports chronic cough that is better at night since starting gabapentin 600 mg at night for hot sweats.  Also reports shortness of breath if coughing 4 days.  Denies wheezing, tightness in chest, and nocturnal awakenings due to breathing problems  Cardiovascular:  Negative for chest pain and palpitations.  Gastrointestinal:        She reports that she thinks she has silent  reflux and heartburn, but not all the time.  Currently on omeprazole and will take Gaviscon or Tums as needed.  She is now doing intermittent fasting to see if this helps.  Genitourinary:  Negative for frequency.  Skin:  Negative for itching and rash.  Neurological:  Negative for headaches.  Endo/Heme/Allergies:  Positive for environmental allergies.    Physical Exam: BP 122/80   Pulse 67   Resp 17   LMP 01/24/2014 Comment: october 2014 was the one prior to this July  SpO2 98%    Physical Exam Constitutional:      Appearance: Normal appearance.  HENT:     Head: Normocephalic and atraumatic.     Comments: Pharynx normal, eyes normal, ears normal, nose normal    Right Ear: Tympanic membrane, ear canal and external ear normal.     Left Ear: Tympanic membrane, ear canal and external ear normal.     Nose: Nose normal.     Mouth/Throat:     Mouth: Mucous membranes are moist.     Pharynx: Oropharynx is clear.  Eyes:     Conjunctiva/sclera: Conjunctivae normal.  Cardiovascular:     Rate and Rhythm: Normal rate and regular rhythm.     Heart sounds: Normal heart sounds.  Pulmonary:     Effort: Pulmonary effort is normal.     Breath sounds: Normal breath sounds.     Comments: Lungs clear to auscultation Musculoskeletal:     Cervical back: Neck supple.  Skin:    General: Skin is warm.  Neurological:     Mental Status: She is alert and oriented to person, place, and time.  Psychiatric:        Mood and Affect: Mood normal.        Behavior: Behavior normal.        Thought Content: Thought content normal.        Judgment: Judgment normal.     Diagnostics: None  Assessment and Plan: 1. Chronic cough   2. Recurrent infections   3. Chronic rhinitis     Meds ordered this encounter  Medications   gabapentin (NEURONTIN) 100 MG capsule    Sig: Take 1 capsule (100 mg total) by mouth every morning.    Dispense:  30 capsule    Refill:  0    Patient Instructions  1. Chronic  cough -Full PFT in May showed no obstruction and restriction - I agree that this does not sound like asthma. - Continue gabapentin 600 mg at night as per OBGYN for night sweats - We will start gabapentin 100 mg in the morning for cough. Caution as this can be sedating  2. Chronic rhinitis - Testing on 01/19/22 was positive to: cockroach (and even this was VERY small) -continue avoidance measures against cockroach - Continue with: Atrovent one spray  per nostril twice daily . 3. Recurrent infections - keep track of current infections and antibiotics - get repeat pneumococcal titers. We will call you with results once they are all back  4. Schedule a follow up appointment in 4 weeks or sooner if needed        Control of Cockroach Allergen  Cockroach allergen has been identified as an important cause of acute attacks of asthma, especially in urban settings.  There are fifty-five species of cockroach that exist in the [place], however only three, the [place], [place] species produce allergen that can affect patients with Asthma.  Allergens can be obtained from fecal particles, egg casings and secretions from cockroaches.    Remove food sources. Reduce access to water. Seal access and entry points. Spray runways with 0.5-1% Diazinon or Chlorpyrifos Blow boric acid power under stoves and refrigerator. Place bait stations (hydramethylnon) at feeding sites.      Return in about 4 weeks (around 05/11/2022), or if symptoms worsen or fail to improve.    Thank you for the opportunity to care for this patient.  Please do not hesitate to contact me with questions.  Althea Charon, FNP [facility]

## 2022-04-18 ENCOUNTER — Telehealth: Payer: Self-pay | Admitting: Internal Medicine

## 2022-04-18 NOTE — Telephone Encounter (Signed)
I spoke to patient and informed her that our December schedule has not been released as of yet. She will callback later this week or next to schedule.

## 2022-04-18 NOTE — Telephone Encounter (Signed)
Received records from Dr. Lorie Apley office. Patient is requesting to see Dr. Hilarie Fredrickson. Her husband also see's Dr. Hilarie Fredrickson. Records placed on Dr. Vena Rua desk for review.

## 2022-04-18 NOTE — Telephone Encounter (Signed)
She was just seen by Juanita Craver on 02/22/2022 for chronic idiopathic constipation, GERD as well as colorectal cancer screening. This note is reviewed Okay for an office visit with me though she must be made aware that the wait for a visit could be several months.

## 2022-04-19 LAB — STREP PNEUMONIAE 23 SEROTYPES IGG
Pneumo Ab Type 1*: 3.5 ug/mL (ref >1.3)
Pneumo Ab Type 12 (12F)*: 1.2 ug/mL — ABNORMAL LOW (ref >1.3)
Pneumo Ab Type 14*: 4.9 ug/mL (ref >1.3)
Pneumo Ab Type 17 (17F)*: 2.6 ug/mL (ref >1.3)
Pneumo Ab Type 19 (19F)*: 7 ug/mL (ref >1.3)
Pneumo Ab Type 2*: 7.1 ug/mL (ref >1.3)
Pneumo Ab Type 20*: 6.7 ug/mL (ref >1.3)
Pneumo Ab Type 22 (22F)*: 7.9 ug/mL (ref >1.3)
Pneumo Ab Type 23 (23F)*: >14.4 ug/mL (ref >1.3)
Pneumo Ab Type 26 (6B)*: 2.9 ug/mL (ref >1.3)
Pneumo Ab Type 3*: 1.4 ug/mL (ref >1.3)
Pneumo Ab Type 34 (10A)*: >19.5 ug/mL (ref >1.3)
Pneumo Ab Type 4*: >8.3 ug/mL (ref >1.3)
Pneumo Ab Type 43 (11A)*: 1.4 ug/mL (ref >1.3)
Pneumo Ab Type 5*: 2 ug/mL (ref >1.3)
Pneumo Ab Type 51 (7F)*: >13.6 ug/mL (ref >1.3)
Pneumo Ab Type 54 (15B)*: 20.2 ug/mL (ref >1.3)
Pneumo Ab Type 56 (18C)*: >8.1 ug/mL (ref >1.3)
Pneumo Ab Type 57 (19A)*: 1.7 ug/mL (ref >1.3)
Pneumo Ab Type 68 (9V)*: 2.3 ug/mL (ref >1.3)
Pneumo Ab Type 70 (33F)*: >10.1 ug/mL (ref >1.3)
Pneumo Ab Type 8*: 8.1 ug/mL (ref >1.3)
Pneumo Ab Type 9 (9N)*: 1 ug/mL — ABNORMAL LOW (ref >1.3)

## 2022-04-19 NOTE — Progress Notes (Signed)
Please let Katie Todd know that she responded well to the Pneumovax. She is now protective to 21 out of the 23 types of Streptococcus pneumonia. She was previously protected to 6 out of the 23 types of Streptococcus pneumonia.

## 2022-04-25 ENCOUNTER — Encounter: Payer: Self-pay | Admitting: Family Medicine

## 2022-04-25 ENCOUNTER — Encounter: Payer: Self-pay | Admitting: Internal Medicine

## 2022-04-25 DIAGNOSIS — H6191 Disorder of right external ear, unspecified: Secondary | ICD-10-CM

## 2022-04-25 DIAGNOSIS — F419 Anxiety disorder, unspecified: Secondary | ICD-10-CM | POA: Diagnosis not present

## 2022-04-25 DIAGNOSIS — R41840 Attention and concentration deficit: Secondary | ICD-10-CM | POA: Diagnosis not present

## 2022-04-28 ENCOUNTER — Encounter: Payer: Self-pay | Admitting: Internal Medicine

## 2022-05-11 ENCOUNTER — Ambulatory Visit (INDEPENDENT_AMBULATORY_CARE_PROVIDER_SITE_OTHER): Payer: BC Managed Care – PPO | Admitting: Allergy & Immunology

## 2022-05-11 ENCOUNTER — Other Ambulatory Visit: Payer: Self-pay

## 2022-05-11 ENCOUNTER — Encounter: Payer: Self-pay | Admitting: Allergy & Immunology

## 2022-05-11 ENCOUNTER — Ambulatory Visit: Payer: BC Managed Care – PPO | Admitting: Family

## 2022-05-11 DIAGNOSIS — J3089 Other allergic rhinitis: Secondary | ICD-10-CM | POA: Diagnosis not present

## 2022-05-11 DIAGNOSIS — R053 Chronic cough: Secondary | ICD-10-CM

## 2022-05-11 DIAGNOSIS — B999 Unspecified infectious disease: Secondary | ICD-10-CM

## 2022-05-11 MED ORDER — GABAPENTIN 100 MG PO CAPS: 100.0000 mg | ORAL_CAPSULE | Freq: Two times a day (BID) | ORAL | 3 refills | Status: DC

## 2022-05-11 NOTE — Progress Notes (Signed)
RE: Katie Todd MRN: [id number] DOB: [birth year] Date of Telemedicine Visit: 05/11/2022  Referring provider: Owens Loffler, MD Primary care provider: Owens Loffler, MD  Chief Complaint: Cough and Other (Gabapentin for chronic cough needs a refill - helps in the morning )   Telemedicine Follow Up Visit via Telephone: I connected with Katie Todd for a follow up on 05/11/22 by telephone and verified that I am speaking with the correct person using two identifiers.   I discussed the limitations, risks, security and privacy concerns of performing an evaluation and management service by telephone and the availability of in person appointments. I also discussed with the patient that there may be a patient responsible charge related to this service. The patient expressed understanding and agreed to proceed.  Patient is at home.  Provider is at the office.  Visit start time: 11:37 AM Visit end time: 12:00 PM Insurance consent/check in by: Katie Todd consent and medical assistant/nurse: Katie Todd  History of Present Illness:  She is a 61 y.o. female, who is being followed for chronic cough as well as allergic rhinitis. Her previous allergy office visit was in September 2023 with Katie Todd.  At that time, she was continued on gabapentin 600 mg at night and started on 100 mg in the morning.  She had testing that was positive to cockroach but was very small back in June 2023.  She had a full PFT in May that was completely normal.  Asthma/Respiratory Symptom History: The morning dose of gabapentin is helping. This wears off around 2-3 pm. She is drinking gallons of Robitussin in the afternoon to help with this.  She tells me gallons, but she is rather dramatic person and assures me it is not gallons.  In any case, she is wondering if she increases her morning dose of gabapentin if it will help throughout the entire day.  She already takes a dose of gabapentin at night prescribed by her OB  for pain.  Allergic Rhinitis Symptom History: She has done well with regards to respiratory infections. She had a virus 2-3 days in total, but otherwise she has been fine.  She is doing fairly well. Her testing was only positive to cockroach.   She got the COVID, flu, and RSV all at one. She reports that she had some bleeding at the site of the injections. She has a huge hematoma and it has been 1-2 weeks since then. She is not overly concerned with it, but her husband was.  She does not normally have bleeding issues like this.  Otherwise, there have been no changes to her past medical history, surgical history, family history, or social history.  Assessment and Plan:  Katie Todd is a 61 y.o. female with:  Chronic cough - followed by Pulmonology (gets better with prednisone and antibiotics)  ? Neurogenic cough   Chronic rhinitis - with minor sensitization to cockroach only   Recurrent infections - with excellent response to Pneumovax  Pulmonary nodules - thought to be secondary to Histoplasmosis    Since last visit, Katie Todd has made some progress regarding her cough.  The gabapentin in the morning seems to have helped.  She is requesting a slightly higher dose for the morning to see if that would last her longer throughout the day.  I think this is reasonable.  We will see how she does with this and make changes accordingly.  She has had a very thorough work-up including ENT as well as pulmonology without much improvement.  She is doing better with this than she has with any of the other specialists.   Diagnostics: None.  Medication List:  Current Outpatient Medications  Medication Sig Dispense Refill   albuterol (VENTOLIN HFA) 108 (90 Base) MCG/ACT inhaler INHALE 2 PUFFS INTO THE LUNGS EVERY 4 HOURS AS NEEDED FOR WHEEZE 8.5 each 2   aspirin 81 MG chewable tablet Chew 81 mg by mouth daily.     Biotin 5000 MCG TABS Take 2 tablets by mouth daily.     calcium carbonate 1250 MG capsule Take  1,250 mg by mouth 2 (two) times daily with a meal.     Dextromethorphan-guaiFENesin (ROBITUSSIN DM PO) Take 20 mLs by mouth daily as needed.     gabapentin (NEURONTIN) 100 MG capsule Take 1 capsule (100 mg total) by mouth every morning. 30 capsule 0   gabapentin (NEURONTIN) 600 MG tablet Take 1 tablet (600 mg total) by mouth at bedtime. 30 tablet 3   ipratropium (ATROVENT) 0.03 % nasal spray Place 2 sprays into both nostrils every 12 (twelve) hours. 30 mL 12   LINZESS 290 MCG CAPS capsule Take 290 mcg by mouth every morning.     metoprolol succinate (TOPROL-XL) 25 MG 24 hr tablet TAKE 1 TABLET BY MOUTH EVERY DAY 90 tablet 3   Multiple Vitamin (MULTIVITAMIN) tablet Take 1 tablet by mouth daily.     omeprazole (PRILOSEC) 20 MG capsule Take 20 mg by mouth daily.     Spacer/Aero-Holding Dorise Bullion Use with Albuterol inhaler 1 each 0   No current facility-administered medications for this visit.   Allergies: Allergies  Allergen Reactions   Penicillins Anaphylaxis   I reviewed her past medical history, social history, family history, and environmental history and no significant changes have been reported from previous visits.  Review of Systems  Constitutional:  Negative for activity change and appetite change.  HENT:  Negative for congestion, postnasal drip, rhinorrhea, sinus pressure and sore throat.   Eyes:  Negative for pain, discharge, redness and itching.  Respiratory:  Positive for cough. Negative for shortness of breath, wheezing and stridor.   Gastrointestinal:  Negative for diarrhea, nausea and vomiting.  Musculoskeletal:  Negative for arthralgias, joint swelling and myalgias.  Skin:  Negative for rash.  Allergic/Immunologic: Positive for environmental allergies. Negative for food allergies.    Objective:  Physical exam not obtained as encounter was done via telephone.   Previous notes and tests were reviewed.  I discussed the assessment and treatment plan with the  patient. The patient was provided an opportunity to ask questions and all were answered. The patient agreed with the plan and demonstrated an understanding of the instructions.   The patient was advised to call back or seek an in-person evaluation if the symptoms worsen or if the condition fails to improve as anticipated.  I provided 23 minutes of non-face-to-face time during this encounter.  It was my pleasure to participate in [place] Taney's care today. Please feel free to contact me with any questions or concerns.   Sincerely,  Valentina Shaggy, MD

## 2022-05-16 ENCOUNTER — Encounter: Payer: Self-pay | Admitting: Allergy & Immunology

## 2022-05-22 ENCOUNTER — Other Ambulatory Visit: Payer: Self-pay | Admitting: Obstetrics & Gynecology

## 2022-05-22 DIAGNOSIS — N951 Menopausal and female climacteric states: Secondary | ICD-10-CM

## 2022-06-09 DIAGNOSIS — L700 Acne vulgaris: Secondary | ICD-10-CM | POA: Diagnosis not present

## 2022-06-09 DIAGNOSIS — H61001 Unspecified perichondritis of right external ear: Secondary | ICD-10-CM | POA: Diagnosis not present

## 2022-06-10 ENCOUNTER — Ambulatory Visit: Payer: BC Managed Care – PPO | Attending: Cardiology | Admitting: Cardiology

## 2022-06-10 ENCOUNTER — Encounter: Payer: Self-pay | Admitting: Cardiology

## 2022-06-10 VITALS — BP 123/83 | HR 67 | Ht 64.0 in | Wt 153.2 lb

## 2022-06-10 DIAGNOSIS — I471 Supraventricular tachycardia, unspecified: Secondary | ICD-10-CM | POA: Diagnosis not present

## 2022-06-10 DIAGNOSIS — R053 Chronic cough: Secondary | ICD-10-CM | POA: Diagnosis not present

## 2022-06-10 DIAGNOSIS — E785 Hyperlipidemia, unspecified: Secondary | ICD-10-CM | POA: Diagnosis not present

## 2022-06-10 NOTE — Progress Notes (Unsigned)
Cardiology Office Note:    Date:  06/12/2022   ID:  Katie Todd, DOB [birth year], MRN [id number]  PCP:  Owens Loffler, MD  Cardiologist:  None  Electrophysiologist:  None   Referring MD: Owens Loffler, MD   Chief Complaint  Patient presents with   SVT    History of Present Illness:    Katie Todd is a 61 y.o. female with a hx of histoplasmosis who presents for follow-up.  She was referred by Dr. Lorelei Pont for evaluation of tachycardia and shortness of breath, initially seen on 12/19/2019.  She reports that she has had issues with acute episodes of dizziness, tachycardia, and shortness of breath since 2007/10/15.  Holter monitor at that time showed rare PACs, occasional PVCs.  Reports she has had 4 of these episodes in the last 61-month  States that episodes are often preceded by abdominal cramps.  Then she will become short of breath and have dizziness and near syncopal episodes.  She denies any chest pain.  States that during episodes her heart beat is irregular.  Checked her BP during last episode was up to 140 over 90s (usually 100s over 60s).  Reports she exercises 6 miles per day on the elliptical.  Occasional dyspnea.  Denies any chest pain, lightheadedness, or syncope with exertion.  She smoked 1 to 2 packs/day x 15 years, quit in 1March 23, 1992  Her father died of MI at age 61  Mother had CHF, unclear cause.  Reports both her mother's sister and brother had aneurysms.  ABIs on 11/15/2019 were normal.  Echocardiogram on 01/09/2020 showed LVEF 50 to 55%, normal RV function, no significant valvular disease.  Zio patch x14 days on 02/17/2020 showed 22 episodes of SVT, longest lasting 21 seconds, two 4 beat episodes of NSVT, occasional PVCs (1.6%).  Calcium score on 01/07/2020 was 0.  Since last clinic visit, she reports she is doing well.  Has had almost no palpitations over the last 2 years, only one episode.  She was recently started on gabapentin for her cough and reports significantly improved.   Denies any chest pain, lightheadedness, or syncope.  She has been doing elliptical 5 days/week for an hour.  Denies any exertional symptoms.  Reports occasional swelling in her ankles.    Past Medical History:  Diagnosis Date   Anxiety state, unspecified 02/23/2013   GERD (gastroesophageal reflux disease) 02/23/2013   Histoplasmosis 2Mar 23, 2006  Insomnia    PAC (premature atrial contraction)    PVC (premature ventricular contraction)    Recurrent upper respiratory infection (URI)    Seasonal allergies 02/23/2013    Past Surgical History:  Procedure Laterality Date   COLPOSCOPY  07/25/2008   Negative   DILATION AND CURETTAGE OF UTERUS     missed ab   NOSE SURGERY     SINOSCOPY      Current Medications: Current Meds  Medication Sig   albuterol (VENTOLIN HFA) 108 (90 Base) MCG/ACT inhaler INHALE 2 PUFFS INTO THE LUNGS EVERY 4 HOURS AS NEEDED FOR WHEEZE   aspirin 81 MG chewable tablet Chew 81 mg by mouth daily.   Biotin 5000 MCG TABS Take 2 tablets by mouth daily.   calcium carbonate 1250 MG capsule Take 1,250 mg by mouth 2 (two) times daily with a meal.   Dextromethorphan-guaiFENesin (ROBITUSSIN DM PO) Take 20 mLs by mouth daily as needed.   gabapentin (NEURONTIN) 100 MG capsule Take 1 capsule (100 mg total) by mouth 2 (two) times daily.   gabapentin (NEURONTIN) 600 MG  tablet TAKE 1 TABLET BY MOUTH AT BEDTIME.   ipratropium (ATROVENT) 0.03 % nasal spray Place 2 sprays into both nostrils every 12 (twelve) hours.   LINZESS 290 MCG CAPS capsule Take 290 mcg by mouth every morning.   metoprolol succinate (TOPROL-XL) 25 MG 24 hr tablet TAKE 1 TABLET BY MOUTH EVERY DAY   Multiple Vitamin (MULTIVITAMIN) tablet Take 1 tablet by mouth daily.   omeprazole (PRILOSEC) 20 MG capsule Take 20 mg by mouth daily.   Spacer/Aero-Holding Dorise Bullion Use with Albuterol inhaler     Allergies:   Penicillins   Social History   Socioeconomic History   Marital status: Married    Spouse name: Not on  file   Number of children: Not on file   Years of education: Not on file   Highest education level: Not on file  Occupational History   Not on file  Tobacco Use   Smoking status: Former    Types: Cigarettes    Quit date: 02/20/1990    Years since quitting: 32.3   Smokeless tobacco: Never  Vaping Use   Vaping Use: Never used  Substance and Sexual Activity   Alcohol use: Not Currently    Comment: rarely   Drug use: No   Sexual activity: Yes    Partners: Male    Birth control/protection: Surgical    Comment: tubal ligation  Other Topics Concern   Not on file  Social History Narrative   Not on file   Social Determinants of Health   Financial Resource Strain: Not on file  Food Insecurity: Not on file  Transportation Needs: Not on file  Physical Activity: Not on file  Stress: Not on file  Social Connections: Not on file     Family History: The patient's family history includes Allergic rhinitis in her granddaughter; Breast cancer (age of onset: 14) in her mother.  ROS:   Please see the history of present illness.    All other systems reviewed and are negative.  EKGs/Labs/Other Studies Reviewed:    The following studies were reviewed today:   EKG:   06/10/2022: Normal sinus rhythm, rate 67, no ST abnormalities  Recent Labs: 01/19/2022: Hemoglobin 13.2  Recent Lipid Panel    Component Value Date/Time   CHOL 200 05/13/2021 0841   TRIG 59.0 05/13/2021 0841   HDL 79.30 05/13/2021 0841   CHOLHDL 3 05/13/2021 0841   VLDL 11.8 05/13/2021 0841   LDLCALC 109 (H) 05/13/2021 0841    Physical Exam:    VS:  BP 123/83   Pulse 67   Ht '5\' 4"'$  (1.626 m)   Wt 153 lb 3.2 oz (69.5 kg)   LMP 01/24/2014 Comment: october 2014 was the one prior to this July  SpO2 96%   BMI 26.30 kg/m     Wt Readings from Last 3 Encounters:  06/10/22 153 lb 3.2 oz (69.5 kg)  03/21/22 153 lb (69.4 kg)  01/31/22 152 lb 2 oz (69 kg)     GEN: in no acute distress HEENT: Normal NECK: No  JVD; No carotid bruits CARDIAC: RRR, no murmurs, rubs, gallops RESPIRATORY:  Clear to auscultation without rales, wheezing or rhonchi  ABDOMEN: Soft, non-tender, non-distended MUSCULOSKELETAL:  No edema; No deformity  SKIN: Warm and dry NEUROLOGIC:  Alert and oriented x 3 PSYCHIATRIC:  Normal affect   ASSESSMENT:    1. SVT (supraventricular tachycardia)   2. Hyperlipidemia, unspecified hyperlipidemia type   3. Chronic cough     PLAN:  SVT: Reported episodes of lightheadedness and palpitations that often break with vagal maneuvers, consistent with SVT.  Echocardiogram on 01/09/2020 showed LVEF 50 to 55%, normal RV function, no significant valvular disease.  Zio patch x14 days on 02/17/2020 showed 22 episodes of SVT, longest lasting 21 seconds, two 4 beat episodes of NSVT, occasional PVCs (1.6%).  -Reported symptoms of Raynaud's that worsened with starting metoprolol, switched to diltiazem with improvement.  Unfortunately reports that diltiazem worsened her chronic cough, so she switched back to metoprolol.  Reports she is tolerating metoprolol well currently with significant improvement in her palpitations  Hyperlipidemia: Last LDL 109 on 05/13/21.  10-year ASCVD risk score is 2%.   Calcium score on 01/07/2020 was 0.  Chronic cough: Referred to pulmonology for evaluation, PFTs normal.  Suspected allergies and GERD contributing.  Recently started gabapentin and reports significant improvement  RTC in 1 year  Medication Adjustments/Labs and Tests Ordered: Current medicines are reviewed at length with the patient today.  Concerns regarding medicines are outlined above.  Orders Placed This Encounter  Procedures   EKG 12-Lead   No orders of the defined types were placed in this encounter.   Patient Instructions  Medication Instructions:  Your physician recommends that you continue on your current medications as directed. Please refer to the Current Medication list given to you  today.  *If you need a refill on your cardiac medications before your next appointment, please call your pharmacy*  Follow-Up: At [facility], you and your health needs are our priority.  As part of our continuing mission to provide you with exceptional heart care, we have created designated Provider Care Teams.  These Care Teams include your primary Cardiologist (physician) and Advanced Practice Providers (APPs -  Physician Assistants and Nurse Practitioners) who all work together to provide you with the care you need, when you need it.  We recommend signing up for the patient portal called "MyChart".  Sign up information is provided on this After Visit Summary.  MyChart is used to connect with patients for Virtual Visits (Telemedicine).  Patients are able to view lab/test results, encounter notes, upcoming appointments, etc.  Non-urgent messages can be sent to your provider as well.   To learn more about what you can do with MyChart, go to [email or web address].    Your next appointment:   12 month(s)  The format for your next appointment:   In Person  Provider:   Dr. Gardiner Rhyme        Signed, Donato Heinz, MD  06/12/2022 2:36 PM    [place]

## 2022-06-10 NOTE — Patient Instructions (Signed)
Medication Instructions:  Your physician recommends that you continue on your current medications as directed. Please refer to the Current Medication list given to you today.  *If you need a refill on your cardiac medications before your next appointment, please call your pharmacy*  Follow-Up: At [facility], you and your health needs are our priority.  As part of our continuing mission to provide you with exceptional heart care, we have created designated Provider Care Teams.  These Care Teams include your primary Cardiologist (physician) and Advanced Practice Providers (APPs -  Physician Assistants and Nurse Practitioners) who all work together to provide you with the care you need, when you need it.  We recommend signing up for the patient portal called "MyChart".  Sign up information is provided on this After Visit Summary.  MyChart is used to connect with patients for Virtual Visits (Telemedicine).  Patients are able to view lab/test results, encounter notes, upcoming appointments, etc.  Non-urgent messages can be sent to your provider as well.   To learn more about what you can do with MyChart, go to [email or web address].    Your next appointment:   12 month(s)  The format for your next appointment:   In Person  Provider:   Dr. Schumann       

## 2022-07-04 ENCOUNTER — Encounter: Payer: Self-pay | Admitting: *Deleted

## 2022-07-09 ENCOUNTER — Other Ambulatory Visit: Payer: Self-pay | Admitting: Family Medicine

## 2022-07-11 NOTE — Telephone Encounter (Signed)
Please call and schedule CPE with fasting labs prior with Dr. Copland.  

## 2022-07-12 NOTE — Telephone Encounter (Signed)
Patient has been scheduled

## 2022-07-19 ENCOUNTER — Encounter: Payer: Self-pay | Admitting: Internal Medicine

## 2022-07-19 ENCOUNTER — Other Ambulatory Visit (HOSPITAL_COMMUNITY): Payer: Self-pay

## 2022-07-19 ENCOUNTER — Telehealth: Payer: Self-pay | Admitting: Pharmacy Technician

## 2022-07-19 ENCOUNTER — Ambulatory Visit (INDEPENDENT_AMBULATORY_CARE_PROVIDER_SITE_OTHER): Payer: BC Managed Care – PPO | Admitting: Internal Medicine

## 2022-07-19 VITALS — BP 110/72 | HR 76 | Ht 64.0 in | Wt 163.0 lb

## 2022-07-19 DIAGNOSIS — K219 Gastro-esophageal reflux disease without esophagitis: Secondary | ICD-10-CM

## 2022-07-19 DIAGNOSIS — R1319 Other dysphagia: Secondary | ICD-10-CM | POA: Diagnosis not present

## 2022-07-19 DIAGNOSIS — R140 Abdominal distension (gaseous): Secondary | ICD-10-CM

## 2022-07-19 DIAGNOSIS — K5909 Other constipation: Secondary | ICD-10-CM | POA: Diagnosis not present

## 2022-07-19 MED ORDER — PLENVU 140 G PO SOLR: 1.0000 | ORAL | 0 refills | Status: DC

## 2022-07-19 MED ORDER — MOTEGRITY 2 MG PO TABS: 1.0000 | ORAL_TABLET | Freq: Every day | ORAL | 1 refills | Status: DC

## 2022-07-19 MED ORDER — PANTOPRAZOLE SODIUM 40 MG PO TBEC: 40.0000 mg | DELAYED_RELEASE_TABLET | Freq: Every day | ORAL | 1 refills | Status: DC

## 2022-07-19 NOTE — Telephone Encounter (Signed)
Patient Advocate Encounter  Received notification from [facility] that prior authorization for MOTEGRITY '2MG'$  is required.   PA submitted on 12.26.23 [place] [id number]  Status is pending

## 2022-07-19 NOTE — Patient Instructions (Addendum)
Please discontinue linzess and omeprazole.  We have sent the following medications to your pharmacy for you to pick up at your convenience:   Start motegrity 2 mg 1 tablet daily and Pantoprazole 40 mg 1 tablet daily  You have been scheduled for an endoscopy and colonoscopy. Please follow the written instructions given to you at your visit today. Please pick up your prep supplies at the pharmacy within the next 1-3 days. If you use inhalers (even only as needed), please bring them with you on the day of your procedure.   Due to recent changes in healthcare laws, you may see the results of your imaging and laboratory studies on MyChart before your provider has had a chance to review them.  We understand that in some cases there may be results that are confusing or concerning to you. Not all laboratory results come back in the same time frame and the provider may be waiting for multiple results in order to interpret others.  Please give [place] 48 hours in order for your provider to thoroughly review all the results before contacting the office for clarification of your results.    Thank you for entrusting me with your care and choosing [facility].  Dr Hilarie Fredrickson

## 2022-07-19 NOTE — Progress Notes (Signed)
HPI: Katie Todd is a 61 year old female with past medical history of chronic lifelong constipation, history of anal fissures, GERD who is seen in consult at the request of Dr. Lorelei Pont to evaluate constipation with infrequent bowel movement, abdominal bloating as well as GERD with intermittent dysphagia.  She is here alone today.  She has had previous GI evaluation and underwent a colonoscopy performed in the [facility] system on 11/16/2010.  This test was reported as normal though the recommendation was to "repeat colonoscopy in 1 year because the bowel preparation was suboptimal".  It was noted to be somewhat difficult due to significant looping. She also had a negative Cologuard in April 2023  She reports that she has had lifelong chronic constipation and infrequent bowel movements.  She describes stools as often hard and at times can be very large.  She has associated gas, bloating and abdominal discomfort.  No specific or localizing abdominal pain.  She recalls multiple prior imaging studies including plain x-ray and CT scan suggestive of constipation and increased colonic stool burden.  Currently she is using Linzess 290 mcg but also requires Colace and frequent use of glycerin suppository to initiate bowel movement.  Even on this regimen she is having bowel movements every 3 to 4 days.  No visible blood in stool.  Prior use of MiraLAX ineffective.  From a reflux perspective she takes omeprazole 20 mg daily this helps but she still has intermittent breakthrough symptoms and also regular belching.  She reports with foods like bread she can have trouble swallowing about half of the time.  Family history of constipation in the patient's mother and granddaughter.  No family history of colon cancer. She is a retired [employer] scientist.  Remote tobacco use over 30 years ago.  No alcohol intake.  Past Medical History:  Diagnosis Date   Anxiety state, unspecified 02/23/2013   Chronic constipation     Depression    GERD (gastroesophageal reflux disease) 02/23/2013   GERD (gastroesophageal reflux disease)    Histoplasmosis 2006   Insomnia    PAC (premature atrial contraction)    PVC (premature ventricular contraction)    Recurrent upper respiratory infection (URI)    Seasonal allergies 02/23/2013    Past Surgical History:  Procedure Laterality Date   COLPOSCOPY  07/25/2008   Negative   DILATION AND CURETTAGE OF UTERUS     missed ab   NOSE SURGERY     SINOSCOPY     TUBAL LIGATION      Outpatient Medications Prior to Visit  Medication Sig Dispense Refill   albuterol (VENTOLIN HFA) 108 (90 Base) MCG/ACT inhaler INHALE 2 PUFFS INTO THE LUNGS EVERY 4 HOURS AS NEEDED FOR WHEEZE 8.5 each 0   aspirin 81 MG chewable tablet Chew 81 mg by mouth daily.     Biotin 5000 MCG TABS Take 2 tablets by mouth daily.     calcium carbonate 1250 MG capsule Take 1,250 mg by mouth 2 (two) times daily with a meal.     Dextromethorphan-guaiFENesin (ROBITUSSIN DM PO) Take 20 mLs by mouth daily as needed.     docusate sodium (COLACE) 100 MG capsule Take 200 mg by mouth 2 (two) times daily.     gabapentin (NEURONTIN) 100 MG capsule Take 1 capsule (100 mg total) by mouth 2 (two) times daily. 180 capsule 3   gabapentin (NEURONTIN) 600 MG tablet TAKE 1 TABLET BY MOUTH AT BEDTIME. 30 tablet 3   glycerin adult 2 g suppository Place 1  suppository rectally as needed for constipation.     ipratropium (ATROVENT) 0.03 % nasal spray Place 2 sprays into both nostrils every 12 (twelve) hours. 30 mL 12   LINZESS 290 MCG CAPS capsule Take 290 mcg by mouth every morning.     metoprolol succinate (TOPROL-XL) 25 MG 24 hr tablet TAKE 1 TABLET BY MOUTH EVERY DAY 90 tablet 3   Multiple Vitamin (MULTIVITAMIN) tablet Take 1 tablet by mouth daily.     omeprazole (PRILOSEC) 20 MG capsule Take 20 mg by mouth daily.     Spacer/Aero-Holding Dorise Bullion Use with Albuterol inhaler 1 each 0   No facility-administered medications  prior to visit.    Allergies  Allergen Reactions   Penicillins Anaphylaxis    Family History  Problem Relation Age of Onset   Breast cancer Mother 97   Stroke Mother    Ovarian cancer Mother    Valvular heart disease Mother    Heart failure Father    Colon polyps Father    Stomach cancer Paternal Grandfather    Allergic rhinitis Granddaughter    Colon cancer Neg Hx     Social History   Tobacco Use   Smoking status: Former    Types: Cigarettes    Quit date: 02/20/1990    Years since quitting: 32.4   Smokeless tobacco: Never  Vaping Use   Vaping Use: Never used  Substance Use Topics   Alcohol use: Not Currently    Comment: rarely   Drug use: No    ROS: As per history of present illness, otherwise negative  Ht '5\' 4"'$  (1.626 m)   Wt 163 lb (73.9 kg)   LMP 01/24/2014 Comment: october 2014 was the one prior to this July  BMI 27.98 kg/m  Gen: awake, alert, NAD HEENT: anicteric  CV: RRR, no mrg Pulm: CTA b/l Abd: soft, NT/ND, +BS throughout Ext: no c/c/e Neuro: nonfocal   RELEVANT LABS AND IMAGING: CBC    Component Value Date/Time   WBC 6.4 01/19/2022 1529   WBC 4.8 05/13/2021 0841   RBC 4.27 01/19/2022 1529   RBC 4.42 05/13/2021 0841   HGB 13.2 01/19/2022 1529   HCT 38.5 01/19/2022 1529   PLT 361.0 05/13/2021 0841   MCV 90 01/19/2022 1529   MCH 30.9 01/19/2022 1529   MCHC 34.3 01/19/2022 1529   MCHC 33.8 05/13/2021 0841   RDW 12.4 01/19/2022 1529   LYMPHSABS 2.0 01/19/2022 1529   MONOABS 0.4 05/13/2021 0841   EOSABS 0.0 01/19/2022 1529   BASOSABS 0.1 01/19/2022 1529    CMP     Component Value Date/Time   NA 141 05/13/2021 0841   K 4.4 05/13/2021 0841   CL 104 05/13/2021 0841   CO2 31 05/13/2021 0841   GLUCOSE 91 05/13/2021 0841   BUN 14 05/13/2021 0841   CREATININE 0.85 05/13/2021 0841   CALCIUM 9.8 05/13/2021 0841   PROT 6.4 05/13/2021 0841   ALBUMIN 4.3 05/13/2021 0841   AST 20 05/13/2021 0841   ALT 17 05/13/2021 0841   ALKPHOS 57  05/13/2021 0841   BILITOT 0.5 05/13/2021 0841    ASSESSMENT/PLAN: 61 year old female with past medical history of chronic lifelong constipation, history of anal fissures, GERD who is seen in consult at the request of Dr. Lorelei Pont to evaluate constipation with infrequent bowel movement, abdominal bloating as well as GERD with intermittent dysphagia.  Chronic constipation --lifelong with associated large stool and gas and bloating.  Even with Linzess she is only having bowel  movements 1 or 2 days/week.  Given ongoing symptoms I recommend that we try a different medication. -- Discontinue Linzess -- Trial of Motegrity 2 mg daily, 14-day sample provided plus co-pay card should this medicine be effective for her -- She can continue glycerin suppositories every morning on an as-needed basis.  I would expect she can discontinue Colace if Motegrity works as it is intended  2.  Colon cancer screening --we will proceed with colonoscopy.  We reviewed the risk, benefits and alternatives and she is agreeable and wishes to proceed  3.  GERD with intermittent dysphagia --persistent breakthrough heartburn and dysphagia symptom despite omeprazole 20 mg daily. -- Discontinue omeprazole and begin pantoprazole 40 mg daily -- Upper endoscopy with possible dilation  We reviewed the risk, benefits and alternatives to both upper and lower endoscopy and she is agreeable and wishes to proceed      GY:[facility], [place], [place],  [place] [address]

## 2022-07-20 ENCOUNTER — Other Ambulatory Visit (HOSPITAL_COMMUNITY): Payer: Self-pay

## 2022-07-20 NOTE — Telephone Encounter (Signed)
Received a fax regarding Prior Authorization from [facility] for [place] '2MG'$ . Authorization has been DENIED because must have a tried/fail of the preferred drugs: lubiprostone, Linzess.

## 2022-07-21 NOTE — Telephone Encounter (Signed)
Dr Hilarie Fredrickson,  Insurance has denied motegrity until patient has tried and failed [place]. Patient already on max dose of Linzess with ineffective results. Can we try Amitiza for insurance purposes?

## 2022-07-22 NOTE — Telephone Encounter (Signed)
Yes, but let her know she does not have to try it long and should let us know if not working Amitiza 24 mcg BID

## 2022-07-26 NOTE — Telephone Encounter (Signed)
I contacted patient to explain that insurance requires she try both [place] prior to approval for Motegrity. Patient immediately advised that she has taken 1 week worth of Motegrity samples and it has "done nothing" for her. States she has not had a BM in 8 days and is extremely bloated and gassy. I questioned if she is using glycerin suppositories as instructed and she confirms that she is taking this every morning or she "cannot even have a bowel movement." I suggested that she try Miralax along with the prescription med and she states that "it does not work." I explained that although Miralax may not work on its own, it may work in conjunction with prescription medication. She then tells me that she has tried Miralax 3 times in her life and she cannot tolerate the side effects (doubled over with cramps) that it produces.   Dr Hilarie Fredrickson, any additional suggestions? Of note, I did tell her that we would probably need her to try Amitiza at least temporarily due to insurance constraints.

## 2022-07-27 NOTE — Telephone Encounter (Signed)
If no response to Motegrity then I would stop it and change to Amitiza 24 mcg BID

## 2022-07-29 MED ORDER — LUBIPROSTONE 24 MCG PO CAPS: 24.0000 ug | ORAL_CAPSULE | Freq: Two times a day (BID) | ORAL | 3 refills | Status: DC

## 2022-07-29 NOTE — Telephone Encounter (Signed)
I have spoken to patient to advise that we will switch her to Amitiza 24 mcg twice daily in place of Motegrity. Advised she should let us know if this is ineffective for her. She verbalizes understanding of this information.

## 2022-08-05 ENCOUNTER — Other Ambulatory Visit: Payer: Self-pay | Admitting: Pulmonary Disease

## 2022-08-08 ENCOUNTER — Other Ambulatory Visit: Payer: Self-pay | Admitting: Family Medicine

## 2022-08-08 ENCOUNTER — Encounter: Payer: Self-pay | Admitting: Family Medicine

## 2022-08-10 ENCOUNTER — Encounter: Payer: Self-pay | Admitting: Internal Medicine

## 2022-08-10 ENCOUNTER — Encounter: Payer: Self-pay | Admitting: Family Medicine

## 2022-08-10 ENCOUNTER — Telehealth (INDEPENDENT_AMBULATORY_CARE_PROVIDER_SITE_OTHER): Payer: BC Managed Care – PPO | Admitting: Family Medicine

## 2022-08-10 VITALS — BP 122/72 | HR 88 | Temp 99.0°F | Ht 64.0 in

## 2022-08-10 DIAGNOSIS — J208 Acute bronchitis due to other specified organisms: Secondary | ICD-10-CM | POA: Diagnosis not present

## 2022-08-10 DIAGNOSIS — R051 Acute cough: Secondary | ICD-10-CM | POA: Diagnosis not present

## 2022-08-10 MED ORDER — GUAIFENESIN-CODEINE 100-10 MG/5ML PO SYRP: 5.0000 mL | ORAL_SOLUTION | Freq: Three times a day (TID) | ORAL | 0 refills | Status: DC | PRN

## 2022-08-10 MED ORDER — DOXYCYCLINE HYCLATE 100 MG PO TABS: 100.0000 mg | ORAL_TABLET | Freq: Two times a day (BID) | ORAL | 0 refills | Status: DC

## 2022-08-10 NOTE — Progress Notes (Signed)
Keasia Dubose T. Melton Walls, MD, [place] at [facility] [place], [address]  Phone: [phone]  FAX: [phone]  Katie Todd - 62 y.o. female  MRN [id number]  Date of Birth: 1961-05-20  Date: 08/10/2022  PCP: Owens Loffler, MD  Referral: Owens Loffler, MD  Chief Complaint  Patient presents with   Cough    2-Negative Home Covid Test-Last one being yesterday   Fever    Low grade   Nasal Congestion   Virtual Visit via Video Note:  I connected with  Katie Todd on 08/10/2022 11:40 AM EST by a video enabled telemedicine application and verified that I am speaking with the correct person using two identifiers.   Location patient: home computer, tablet, or smartphone Location provider: work or home office Consent: Verbal consent directly obtained from [employer]. Persons participating in the virtual visit: patient, provider  I discussed the limitations of evaluation and management by telemedicine and the availability of in person appointments. The patient expressed understanding and agreed to proceed.  Chief Complaint  Patient presents with   Cough    2-Negative Home Covid Test-Last one being yesterday   Fever    Low grade   Nasal Congestion    History of Present Illness:  She is a very pleasant 41 lady and she is known very well.  She presents via video visit today with some continued 1 week length of cough as well as profuse nasal congestion.  She is coughing an excessive amount with production of yellowish-green sputum, sometimes taking 15 or 20 minutes to cough everything up at a time.  She has had a low-grade temperature, and she also has some sinus pain and headache.  She has had some nausea, but she has not had any vomiting or diarrhea.  She has not had any loss of taste or smell.  Chest hurts, coughing, feels like she cannot really sleep well 99 Temp Chest and throat hurts Irrigating her  sinuses Some nausea  Robitussin - DM    Review of Systems as above: See pertinent positives and pertinent negatives per HPI No acute distress verbally   Observations/Objective/Exam:  An attempt was made to discern vital signs over the phone and per patient if applicable and possible.   General:    Alert, Oriented, appears well and in no acute distress  Pulmonary:     On inspection no signs of respiratory distress.  Psych / Neurological:     Pleasant and cooperative.  Assessment and Plan:    ICD-10-CM   1. Acute bronchitis due to other specified organisms  J20.8     2. Acute cough  R05.1      Prolonged respiratory illness x 1 week.  She is clinically worsening over the last few days, and I am going to place her on doxycycline and give her some strong cough medication.  I discussed the assessment and treatment plan with the patient. The patient was provided an opportunity to ask questions and all were answered. The patient agreed with the plan and demonstrated an understanding of the instructions.   The patient was advised to call back or seek an in-person evaluation if the symptoms worsen or if the condition fails to improve as anticipated.  Follow-up: prn unless noted otherwise below No follow-ups on file.  Meds ordered this encounter  Medications   doxycycline (VIBRA-TABS) 100 MG tablet    Sig: Take 1 tablet (100 mg total) by mouth 2 (  two) times daily.    Dispense:  20 tablet    Refill:  0   guaiFENesin-codeine (ROBITUSSIN AC) 100-10 MG/5ML syrup    Sig: Take 5 mLs by mouth 3 (three) times daily as needed for cough.    Dispense:  120 mL    Refill:  0   No orders of the defined types were placed in this encounter.   Signed,  Maud Deed. Dorothymae Maciver, MD

## 2022-08-10 NOTE — Telephone Encounter (Signed)
Yes, please set up virtual ASAP.

## 2022-08-10 NOTE — Telephone Encounter (Signed)
Patient is scheduled today at 11:40 am.

## 2022-08-11 ENCOUNTER — Encounter: Payer: Self-pay | Admitting: Internal Medicine

## 2022-08-11 ENCOUNTER — Other Ambulatory Visit: Payer: Self-pay | Admitting: Internal Medicine

## 2022-08-11 ENCOUNTER — Telehealth: Payer: Self-pay | Admitting: Internal Medicine

## 2022-08-11 NOTE — Telephone Encounter (Signed)
Patient states she has bronchitis, please advise.  Thank you

## 2022-08-11 NOTE — Telephone Encounter (Signed)
Pt states she was started on doxycycline yesterday, a 10 day course for bronchitis. Pt scheduled for [facility] 1/24. States her PCP thought she would be ok for procedure but she wanted to make sure. Please advise.  She also states amitiza did not work for her and she was taking it with stool softeners and fleets supp. Pt states she has started taking linzess again.

## 2022-08-12 NOTE — Telephone Encounter (Signed)
Spoke with pt and she is aware of Dr. Quentin Mulling recommendations.

## 2022-08-12 NOTE — Telephone Encounter (Signed)
See phone note

## 2022-08-12 NOTE — Telephone Encounter (Signed)
[place] for procedures while on doxycycline assuming no chest pain or dyspnea

## 2022-08-17 ENCOUNTER — Encounter: Payer: Self-pay | Admitting: Internal Medicine

## 2022-08-17 ENCOUNTER — Ambulatory Visit (AMBULATORY_SURGERY_CENTER): Payer: BC Managed Care – PPO | Admitting: Internal Medicine

## 2022-08-17 VITALS — BP 129/79 | HR 86 | Temp 97.7°F | Resp 13 | Ht 64.0 in | Wt 163.0 lb

## 2022-08-17 DIAGNOSIS — R1319 Other dysphagia: Secondary | ICD-10-CM | POA: Diagnosis not present

## 2022-08-17 DIAGNOSIS — K222 Esophageal obstruction: Secondary | ICD-10-CM

## 2022-08-17 DIAGNOSIS — D122 Benign neoplasm of ascending colon: Secondary | ICD-10-CM

## 2022-08-17 DIAGNOSIS — Z1211 Encounter for screening for malignant neoplasm of colon: Secondary | ICD-10-CM | POA: Diagnosis not present

## 2022-08-17 DIAGNOSIS — K219 Gastro-esophageal reflux disease without esophagitis: Secondary | ICD-10-CM

## 2022-08-17 DIAGNOSIS — D123 Benign neoplasm of transverse colon: Secondary | ICD-10-CM

## 2022-08-17 MED ORDER — SODIUM CHLORIDE 0.9 % IV SOLN: 500.0000 mL | INTRAVENOUS | Status: DC

## 2022-08-17 NOTE — Op Note (Signed)
[name] Procedure Date: 08/17/2022 3:18 PM MRN: [id number] Endoscopist: Jerene Bears , MD, [id number] Age: 62 Referring MD:  Date of Birth: Jun 28, 1961 Gender: Female Account #: [id number] Procedure:                Upper GI endoscopy Indications:              Dysphagia, Gastro-esophageal reflux disease Medicines:                Monitored Anesthesia Care Procedure:                Pre-Anesthesia Assessment:                           - Prior to the procedure, a History and Physical                            was performed, and patient medications and                            allergies were reviewed. The patient's tolerance of                            previous anesthesia was also reviewed. The risks                            and benefits of the procedure and the sedation                            options and risks were discussed with the patient.                            All questions were answered, and informed consent                            was obtained. Prior Anticoagulants: The patient has                            taken no anticoagulant or antiplatelet agents. ASA                            Grade Assessment: II - A patient with mild systemic                            disease. After reviewing the risks and benefits,                            the patient was deemed in satisfactory condition to                            undergo the procedure.                           After obtaining informed consent, the endoscope was  passed under direct vision. Throughout the                            procedure, the patient's blood pressure, pulse, and                            oxygen saturations were monitored continuously. The                            GIF [id number] #[id number] was introduced through the                            mouth, and advanced to the second part of duodenum.                            The upper GI  endoscopy was accomplished without                            difficulty. The patient tolerated the procedure                            well. Scope In: Scope Out: Findings:                 One benign-appearing, intrinsic mild                            (non-circumferential scarring) stenosis was found                            at the gastroesophageal junction. This stenosis                            measured 1.4 cm (inner diameter) x less than one cm                            (in length). The stenosis was traversed. A TTS                            dilator was passed through the scope. Dilation with                            an 18-19-20 mm balloon dilator was performed to 20                            mm.                           The exam of the esophagus was otherwise normal.                           The entire examined stomach was normal.                           The examined duodenum was normal. Complications:  No immediate complications. Estimated Blood Loss:     Estimated blood loss: none. Impression:               - Benign-appearing esophageal stenosis. Dilated to                            20 mm with balloon.                           - Normal stomach.                           - Normal examined duodenum.                           - No specimens collected. Recommendation:           - Patient has a contact number available for                            emergencies. The signs and symptoms of potential                            delayed complications were discussed with the                            patient. Return to normal activities tomorrow.                            Written discharge instructions were provided to the                            patient.                           - Resume previous diet.                           - Continue present medications.                           - Await pathology results. Jerene Bears, MD 08/17/2022 3:54:10 PM This  report has been signed electronically.

## 2022-08-17 NOTE — Progress Notes (Signed)
Called to room to assist during endoscopic procedure.  Patient ID and intended procedure confirmed with present staff. Received instructions for my participation in the procedure from the performing physician.

## 2022-08-17 NOTE — Progress Notes (Signed)
See office note dated 07/19/2022 for details Patient presenting for upper endoscopy to evaluate GERD with intermittent dysphagia, colonoscopy for colon cancer screening  She remains appropriate for upper and lower endoscopy in the [facility] today.

## 2022-08-17 NOTE — Patient Instructions (Addendum)
Handout on polyps and diverticulosis given.   Resume previous diet and current medications.      YOU HAD AN ENDOSCOPIC PROCEDURE TODAY AT [facility]:   Refer to the procedure report that was given to you for any specific questions about what was found during the examination.  If the procedure report does not answer your questions, please call your gastroenterologist to clarify.  If you requested that your care partner not be given the details of your procedure findings, then the procedure report has been included in a sealed envelope for you to review at your convenience later.  YOU SHOULD EXPECT: Some feelings of bloating in the abdomen. Passage of more gas than usual.  Walking can help get rid of the air that was put into your GI tract during the procedure and reduce the bloating. If you had a lower endoscopy (such as a colonoscopy or flexible sigmoidoscopy) you may notice spotting of blood in your stool or on the toilet paper. If you underwent a bowel prep for your procedure, you may not have a normal bowel movement for a few days.  Please Note:  You might notice some irritation and congestion in your nose or some drainage.  This is from the oxygen used during your procedure.  There is no need for concern and it should clear up in a day or so.  SYMPTOMS TO REPORT IMMEDIATELY:  Following lower endoscopy (colonoscopy or flexible sigmoidoscopy):  Excessive amounts of blood in the stool  Significant tenderness or worsening of abdominal pains  Swelling of the abdomen that is new, acute  Fever of 100F or higher  Following upper endoscopy (EGD)  Vomiting of blood or coffee ground material  New chest pain or pain under the shoulder blades  Painful or persistently difficult swallowing  New shortness of breath  Fever of 100F or higher  Black, tarry-looking stools  For urgent or emergent issues, a gastroenterologist can be reached at any hour by calling [phone]. Do not  use MyChart messaging for urgent concerns.    DIET:  We do recommend a small meal at first, but then you may proceed to your regular diet.  Drink plenty of fluids but you should avoid alcoholic beverages for 24 hours.  ACTIVITY:  You should plan to take it easy for the rest of today and you should NOT DRIVE or use heavy machinery until tomorrow (because of the sedation medicines used during the test).    FOLLOW UP: Our staff will call the number listed on your records the next business day following your procedure.  We will call around 7:15- 8:00 am to check on you and address any questions or concerns that you may have regarding the information given to you following your procedure. If we do not reach you, we will leave a message.     If any biopsies were taken you will be contacted by phone or by letter within the next 1-3 weeks.  Please call us at [phone] if you have not heard about the biopsies in 3 weeks.    SIGNATURES/CONFIDENTIALITY: You and/or your care partner have signed paperwork which will be entered into your electronic medical record.  These signatures attest to the fact that that the information above on your After Visit Summary has been reviewed and is understood.  Full responsibility of the confidentiality of this discharge information lies with you and/or your care-partner.

## 2022-08-17 NOTE — Progress Notes (Signed)
Report to PACU, RN, vss, BBS= Clear.  

## 2022-08-17 NOTE — Op Note (Signed)
[facility] Patient Name: Katie Todd Procedure Date: 08/17/2022 3:18 PM MRN: [id number] Endoscopist: Jerene Bears , MD, [id number] Age: 62 Referring MD:  Date of Birth: Jun 23, 1961 Gender: Female Account #: [id number] Procedure:                Colonoscopy Indications:              Screening for colorectal malignant neoplasm, Last                            colonoscopy: 2012 Medicines:                Monitored Anesthesia Care Procedure:                Pre-Anesthesia Assessment:                           - Prior to the procedure, a History and Physical                            was performed, and patient medications and                            allergies were reviewed. The patient's tolerance of                            previous anesthesia was also reviewed. The risks                            and benefits of the procedure and the sedation                            options and risks were discussed with the patient.                            All questions were answered, and informed consent                            was obtained. Prior Anticoagulants: The patient has                            taken no anticoagulant or antiplatelet agents. ASA                            Grade Assessment: II - A patient with mild systemic                            disease. After reviewing the risks and benefits,                            the patient was deemed in satisfactory condition to                            undergo the procedure.  After obtaining informed consent, the colonoscope                            was passed under direct vision. Throughout the                            procedure, the patient's blood pressure, pulse, and                            oxygen saturations were monitored continuously. The                            Olympus PCF-H190DL (#[id number]) Colonoscope was                            introduced through the anus and advanced to  the                            cecum, identified by appendiceal orifice and                            ileocecal valve. The colonoscopy was performed                            without difficulty. The patient tolerated the                            procedure well. The quality of the bowel                            preparation was excellent. The ileocecal valve,                            appendiceal orifice, and rectum were photographed. Scope In: 3:34:41 PM Scope Out: 3:49:46 PM Scope Withdrawal Time: 0 hours 10 minutes 24 seconds  Total Procedure Duration: 0 hours 15 minutes 5 seconds  Findings:                 The digital rectal exam was normal.                           A 2 mm polyp was found in the ascending colon. The                            polyp was sessile. The polyp was removed with a                            cold biopsy forceps. Resection and retrieval were                            complete.                           A 5 mm polyp was found in the transverse colon. The  polyp was sessile. The polyp was removed with a                            cold snare. Resection and retrieval were complete.                           Multiple small-mouthed diverticula were found in                            the descending colon and ascending colon.                           The retroflexed view of the distal rectum and anal                            verge was normal and showed no anal or rectal                            abnormalities. Complications:            No immediate complications. Estimated Blood Loss:     Estimated blood loss was minimal. Impression:               - One 2 mm polyp in the ascending colon, removed                            with a cold biopsy forceps. Resected and retrieved.                           - One 5 mm polyp in the transverse colon, removed                            with a cold snare. Resected and retrieved.                            - Mild diverticulosis in the descending colon and                            in the ascending colon.                           - The distal rectum and anal verge are normal on                            retroflexion view. Recommendation:           - Patient has a contact number available for                            emergencies. The signs and symptoms of potential                            delayed complications were discussed with the  patient. Return to normal activities tomorrow.                            Written discharge instructions were provided to the                            patient.                           - Resume previous diet.                           - Continue present medications. Amitiza and                            Motegrity were not effective. Can resume Linzess                            290 mcg daily for chronic constipation.                           - Await pathology results.                           - Repeat colonoscopy is recommended. The                            colonoscopy date will be determined after pathology                            results from today's exam become available for                            review. Jerene Bears, MD 08/17/2022 3:57:05 PM This report has been signed electronically.

## 2022-08-18 ENCOUNTER — Telehealth: Payer: Self-pay

## 2022-08-18 NOTE — Telephone Encounter (Signed)
Left message on follow up call. 

## 2022-08-24 ENCOUNTER — Encounter: Payer: Self-pay | Admitting: Family Medicine

## 2022-08-24 MED ORDER — PREDNISONE 20 MG PO TABS: ORAL_TABLET | ORAL | 0 refills | Status: DC

## 2022-08-24 NOTE — Telephone Encounter (Signed)
Spoke to patient by telephone and was advised that she feels like she is having bronchial spasms. Patient stated that she started feeling better after she started the antibiotic that Dr. Lorelei Pont gave her. Patient stated that the cough medication does not seem to help her cough much. Patient stated that the inhaler does not help much either. Patient stated that she only feels dizzy when she has a bad coughing spell. Patient denies a fever or SOB. Patient stated that she has a cough that occasionally is productive/clear. Patient stated she woke up coughing after her procedure Wednesday. Patient wants to know if Dr. Lorelei Pont thinks at this point she may need some steroids. Pharmacy [facility]

## 2022-08-25 ENCOUNTER — Encounter: Payer: Self-pay | Admitting: Internal Medicine

## 2022-08-31 ENCOUNTER — Encounter: Payer: Self-pay | Admitting: Cardiology

## 2022-08-31 ENCOUNTER — Encounter: Payer: Self-pay | Admitting: Internal Medicine

## 2022-09-01 ENCOUNTER — Other Ambulatory Visit: Payer: Self-pay

## 2022-09-01 MED ORDER — PANTOPRAZOLE SODIUM 40 MG PO TBEC: 40.0000 mg | DELAYED_RELEASE_TABLET | Freq: Two times a day (BID) | ORAL | 3 refills | Status: DC

## 2022-09-01 MED ORDER — LINACLOTIDE 290 MCG PO CAPS: 290.0000 ug | ORAL_CAPSULE | Freq: Every day | ORAL | 3 refills | Status: DC

## 2022-09-01 NOTE — Telephone Encounter (Signed)
She should go back to Linzess 290 mcg daily.  She can still use glycerin suppositories or even senna 1-2 tabs nightly in addition to Linzess if needed Increase pantoprazole to 40 mg twice daily AC given uncontrolled

## 2022-09-19 ENCOUNTER — Other Ambulatory Visit: Payer: Self-pay | Admitting: Pulmonary Disease

## 2022-09-19 ENCOUNTER — Other Ambulatory Visit: Payer: Self-pay | Admitting: Obstetrics & Gynecology

## 2022-09-19 DIAGNOSIS — N951 Menopausal and female climacteric states: Secondary | ICD-10-CM

## 2022-09-22 ENCOUNTER — Encounter: Payer: Self-pay | Admitting: Radiology

## 2022-10-12 ENCOUNTER — Encounter: Payer: BC Managed Care – PPO | Admitting: Family Medicine

## 2022-11-30 ENCOUNTER — Ambulatory Visit (INDEPENDENT_AMBULATORY_CARE_PROVIDER_SITE_OTHER): Payer: BC Managed Care – PPO | Admitting: Family Medicine

## 2022-11-30 ENCOUNTER — Encounter: Payer: Self-pay | Admitting: Family Medicine

## 2022-11-30 VITALS — BP 112/70 | HR 83 | Temp 98.3°F | Ht 64.0 in | Wt 161.0 lb

## 2022-11-30 DIAGNOSIS — J208 Acute bronchitis due to other specified organisms: Secondary | ICD-10-CM | POA: Diagnosis not present

## 2022-11-30 DIAGNOSIS — E663 Overweight: Secondary | ICD-10-CM

## 2022-11-30 MED ORDER — DOXYCYCLINE HYCLATE 100 MG PO TABS: 100.0000 mg | ORAL_TABLET | Freq: Two times a day (BID) | ORAL | 0 refills | Status: DC

## 2022-11-30 MED ORDER — GUAIFENESIN-CODEINE 100-10 MG/5ML PO SOLN: 5.0000 mL | Freq: Four times a day (QID) | ORAL | 0 refills | Status: DC | PRN

## 2022-11-30 NOTE — Progress Notes (Signed)
Katie Todd T. Katie Oats, MD, CAQ [facility] at [facility] [address] [place], [address]  Phone: [phone]  FAX: [phone]  Katie Todd - 62 y.o. female  MRN [id number]  Date of Birth: July 01, 1961  Date: 11/30/2022  PCP: Hannah Beat, MD  Referral: Hannah Beat, MD  Chief Complaint  Patient presents with   Cough   Fever    Low grade   Subjective:   Katie Todd is a 62 y.o. very pleasant female patient with Body mass index is 27.64 kg/m. who presents with the following:  Up all night coughing -this has been relatively profuse.  This is occurred over the last few days. Grandchildren are sick and teaching other kids.  Short of breath Intermittent low-grade fevers.  She has tried multiple over-the-counter remedies without much significant success.  She is a non-smoker. She also has some nasal congestion.  Pharmacy assistance for Linzess -she is going to try to get this through her insurance. She will call with she needs some additional assistance  Worried about her weight Not eating that much and ellipitical for twenty miles a week  Has tried intermittent fasting Did not help at all   Wt Readings from Last 3 Encounters:  11/30/22 161 lb (73 kg)  08/17/22 163 lb (73.9 kg)  07/19/22 163 lb (73.9 kg)     Review of Systems is noted in the HPI, as appropriate  Objective:   BP 112/70 (BP Location: Left Arm, Patient Position: Sitting, Cuff Size: Normal)   Pulse 83   Temp 98.3 F (36.8 C) (Temporal)   Ht 5\' 4"  (1.626 m)   Wt 161 lb (73 kg)   LMP 01/24/2014 Comment: october 2014 was the one prior to this July  SpO2 96%   BMI 27.64 kg/m    Gen: WDWN, NAD. Globally Non-toxic HEENT: Throat clear, w/o exudate, R TM clear, L TM - good landmarks, No fluid present. rhinnorhea.  MMM Frontal sinuses: NT Max sinuses: NT NECK: Anterior cervical  LAD is absent CV: RRR, No M/G/R, cap refill <2 sec PULM: Breathing  comfortably in no respiratory distress. no wheezing, crackles, rhonchi   Laboratory and Imaging Data:  Assessment and Plan:     ICD-10-CM   1. Acute bronchitis due to other specified organisms  J20.8     2. Overweight (BMI 25.0-29.9)  E66.3      I will place the patient on doxycycline and also give her some Robitussin AC to use for cough at nighttime.  Slow weight gain over years.  This is going to be a lot more challenging to deal with.  She is already eating very well, and she exercises.  She has tried intermittent fasting without any significant relief.  I do not have very many good solutions for the patient.  Medication Management during today's office visit: Meds ordered this encounter  Medications   doxycycline (VIBRA-TABS) 100 MG tablet    Sig: Take 1 tablet (100 mg total) by mouth 2 (two) times daily.    Dispense:  20 tablet    Refill:  0   guaiFENesin-codeine 100-10 MG/5ML syrup    Sig: Take 5 mLs by mouth every 6 (six) hours as needed for cough.    Dispense:  120 mL    Refill:  0   Medications Discontinued During This Encounter  Medication Reason   PEG-KCl-NaCl-NaSulf-Na Asc-C (PLENVU) 140 g SOLR Completed Course   lubiprostone (AMITIZA) 24 MCG capsule Completed Course  doxycycline (VIBRA-TABS) 100 MG tablet Completed Course   guaiFENesin-codeine (ROBITUSSIN AC) 100-10 MG/5ML syrup Completed Course   pantoprazole (PROTONIX) 40 MG tablet Completed Course   predniSONE (DELTASONE) 20 MG tablet Completed Course   Dextromethorphan-guaiFENesin (ROBITUSSIN DM PO) Completed Course    Orders placed today for conditions managed today: No orders of the defined types were placed in this encounter.   Disposition: No follow-ups on file.  Dragon [employer] One speech-to-text software was used for transcription in this dictation.  Possible transcriptional errors can occur using Animal nutritionist.   Signed,  Elpidio Galea. Abdiel Blackerby, MD   Outpatient Encounter Medications as of  11/30/2022  Medication Sig   albuterol (VENTOLIN HFA) 108 (90 Base) MCG/ACT inhaler INHALE 2 PUFFS INTO THE LUNGS EVERY 4 HOURS AS NEEDED FOR WHEEZE   aspirin 81 MG chewable tablet Chew 81 mg by mouth daily.   Biotin 5000 MCG TABS Take 2 tablets by mouth daily.   calcium carbonate 1250 MG capsule Take 1,250 mg by mouth 2 (two) times daily with a meal.   docusate sodium (COLACE) 100 MG capsule Take 200 mg by mouth 2 (two) times daily.   doxycycline (VIBRA-TABS) 100 MG tablet Take 1 tablet (100 mg total) by mouth 2 (two) times daily.   gabapentin (NEURONTIN) 100 MG capsule Take 1 capsule (100 mg total) by mouth 2 (two) times daily.   gabapentin (NEURONTIN) 600 MG tablet TAKE 1 TABLET BY MOUTH EVERYDAY AT BEDTIME   glycerin adult 2 g suppository Place 1 suppository rectally as needed for constipation.   guaiFENesin-codeine 100-10 MG/5ML syrup Take 5 mLs by mouth every 6 (six) hours as needed for cough.   ipratropium (ATROVENT) 0.03 % nasal spray PLACE 2 SPRAYS INTO BOTH NOSTRILS EVERY 12 HOURS.   linaclotide (LINZESS) 290 MCG CAPS capsule Take 1 capsule (290 mcg total) by mouth daily before breakfast.   metoprolol succinate (TOPROL-XL) 25 MG 24 hr tablet TAKE 1 TABLET BY MOUTH EVERY DAY   Multiple Vitamin (MULTIVITAMIN) tablet Take 1 tablet by mouth daily.   pantoprazole (PROTONIX) 40 MG tablet Take 1 tablet (40 mg total) by mouth 2 (two) times daily before a meal.   RETIN-A 0.025 % cream Apply topically at bedtime.   Spacer/Aero-Holding Chambers DEVI Use with Albuterol inhaler   [DISCONTINUED] Dextromethorphan-guaiFENesin (ROBITUSSIN DM PO) Take 20 mLs by mouth daily as needed.   [DISCONTINUED] doxycycline (VIBRA-TABS) 100 MG tablet Take 1 tablet (100 mg total) by mouth 2 (two) times daily.   [DISCONTINUED] guaiFENesin-codeine (ROBITUSSIN AC) 100-10 MG/5ML syrup Take 5 mLs by mouth 3 (three) times daily as needed for cough.   [DISCONTINUED] lubiprostone (AMITIZA) 24 MCG capsule Take 1 capsule (24  mcg total) by mouth 2 (two) times daily with a meal. Pharmacy-please d/c script for motegrity. Not covered by insurance.   [DISCONTINUED] pantoprazole (PROTONIX) 40 MG tablet TAKE 1 TABLET BY MOUTH EVERY DAY   [DISCONTINUED] PEG-KCl-NaCl-NaSulf-Na Asc-C (PLENVU) 140 g SOLR Take 1 kit by mouth as directed. Use coupon: BIN: 161096 PNC: CNRX Group: [id number] ID: [id number]   [DISCONTINUED] predniSONE (DELTASONE) 20 MG tablet 2 tabs po daily for 5 days, then 1 tab po daily for 5 days   No facility-administered encounter medications on file as of 11/30/2022.

## 2023-01-15 ENCOUNTER — Other Ambulatory Visit: Payer: Self-pay | Admitting: Obstetrics & Gynecology

## 2023-01-15 DIAGNOSIS — N951 Menopausal and female climacteric states: Secondary | ICD-10-CM

## 2023-02-07 ENCOUNTER — Other Ambulatory Visit: Payer: Self-pay | Admitting: Cardiology

## 2023-03-01 ENCOUNTER — Encounter: Payer: Self-pay | Admitting: Family Medicine

## 2023-03-01 ENCOUNTER — Telehealth (INDEPENDENT_AMBULATORY_CARE_PROVIDER_SITE_OTHER): Payer: BC Managed Care – PPO | Admitting: Family Medicine

## 2023-03-01 VITALS — BP 120/80 | HR 88 | Temp 99.9°F | Ht 64.0 in

## 2023-03-01 DIAGNOSIS — U071 COVID-19: Secondary | ICD-10-CM | POA: Insufficient documentation

## 2023-03-01 MED ORDER — GUAIFENESIN-CODEINE 100-10 MG/5ML PO SYRP: 5.0000 mL | ORAL_SOLUTION | Freq: Every evening | ORAL | 0 refills | Status: DC | PRN

## 2023-03-01 MED ORDER — PREDNISONE 20 MG PO TABS
ORAL_TABLET | ORAL | 0 refills | Status: DC
Start: 2023-03-01 — End: 2023-04-13

## 2023-03-01 NOTE — Progress Notes (Signed)
VIRTUAL VISIT A virtual visit is felt to be most appropriate for this patient at this time.   I connected with the patient on 03/01/23 at 11:40 AM EDT by virtual telehealth platform and verified that I am speaking with the correct person using two identifiers.   I discussed the limitations, risks, security and privacy concerns of performing an evaluation and management service by  virtual telehealth platform and the availability of in person appointments. I also discussed with the patient that there may be a patient responsible charge related to this service. The patient expressed understanding and agreed to proceed.  Patient location: Home Provider Location: [facility] Participants: Katie Todd Ermalene Searing and Kandace Parkins   Chief Complaint  Patient presents with   Covid Positive    Tested positive today. Patient has had fever, chest congestion, scratchy voice, cough (productive dark green mucus) since Sunday. Has been taking musinex, sudafed, ibuprofen and using her inhaler when needed.     History of Present Illness:  62 y.o. female patient of Copland, Spencer, MD presents with  COVID infection.  Date of onset: 4 days Initial symptoms included  fever low grade 99.0, congestion, ST Symptoms progressed to productive cough, green mucus  She has noted wheeze and SOB.   Brain fog.  Sick contacts:  husband COVID testing:    positive today   She is not interested in paxolvid... As when she has used this in the past she felt better on it but symptoms quadrupled after coming off.     She has tried to treat with Mucinex, ibuprofen, Sudafed Albuterol inhaler as needed Sues gabapentin for chronic cough.  Using old guaifenesin with codeine.   She has a history of reactive airways, has history of histoplasmosis/nodules Former smoker.   COVID 19 screen No recent travel or known exposure to COVID19 The patient denies respiratory symptoms of COVID 19 at this time.  The importance of  social distancing was discussed today.   Review of Systems  Constitutional:  Negative for chills and fever.  HENT:  Positive for congestion. Negative for ear pain.   Eyes:  Negative for pain and redness.  Respiratory:  Positive for cough, shortness of breath and wheezing.   Cardiovascular:  Negative for chest pain, palpitations and leg swelling.  Gastrointestinal:  Negative for abdominal pain, blood in stool, constipation, diarrhea, nausea and vomiting.  Genitourinary:  Negative for dysuria.  Musculoskeletal:  Negative for falls and myalgias.  Skin:  Negative for rash.  Neurological:  Negative for dizziness.  Psychiatric/Behavioral:  Negative for depression. The patient is not nervous/anxious.       Past Medical History:  Diagnosis Date   Anxiety state, unspecified 02/23/2013   Chronic constipation    Depression    GERD (gastroesophageal reflux disease) 02/23/2013   GERD (gastroesophageal reflux disease)    Heart murmur    Histoplasmosis 2006   Insomnia    PAC (premature atrial contraction)    PVC (premature ventricular contraction)    Recurrent upper respiratory infection (URI)    Seasonal allergies 02/23/2013    reports that she quit smoking about 33 years ago. Her smoking use included cigarettes. She has never used smokeless tobacco. She reports that she does not currently use alcohol. She reports that she does not use drugs.   Current Outpatient Medications:    albuterol (VENTOLIN HFA) 108 (90 Base) MCG/ACT inhaler, INHALE 2 PUFFS INTO THE LUNGS EVERY 4 HOURS AS NEEDED FOR WHEEZE, Disp: 8.5 each, Rfl: 2  aspirin 81 MG chewable tablet, Chew 81 mg by mouth daily., Disp: , Rfl:    Biotin 5000 MCG TABS, Take 2 tablets by mouth daily., Disp: , Rfl:    calcium carbonate 1250 MG capsule, Take 1,250 mg by mouth 2 (two) times daily with a meal., Disp: , Rfl:    docusate sodium (COLACE) 100 MG capsule, Take 200 mg by mouth 2 (two) times daily., Disp: , Rfl:    gabapentin (NEURONTIN)  100 MG capsule, Take 1 capsule (100 mg total) by mouth 2 (two) times daily., Disp: 180 capsule, Rfl: 3   gabapentin (NEURONTIN) 600 MG tablet, TAKE 1 TABLET BY MOUTH EVERYDAY AT BEDTIME, Disp: 30 tablet, Rfl: 3   glycerin adult 2 g suppository, Place 1 suppository rectally as needed for constipation., Disp: , Rfl:    guaiFENesin-codeine (ROBITUSSIN AC) 100-10 MG/5ML syrup, Take 5-10 mLs by mouth at bedtime as needed for cough., Disp: 180 mL, Rfl: 0   ipratropium (ATROVENT) 0.03 % nasal spray, PLACE 2 SPRAYS INTO BOTH NOSTRILS EVERY 12 HOURS., Disp: 30 mL, Rfl: 4   linaclotide (LINZESS) 290 MCG CAPS capsule, Take 1 capsule (290 mcg total) by mouth daily before breakfast., Disp: 30 capsule, Rfl: 3   metoprolol succinate (TOPROL-XL) 25 MG 24 hr tablet, Take 1 tablet (25 mg total) by mouth daily., Disp: 90 tablet, Rfl: 1   Multiple Vitamin (MULTIVITAMIN) tablet, Take 1 tablet by mouth daily., Disp: , Rfl:    pantoprazole (PROTONIX) 40 MG tablet, Take 1 tablet (40 mg total) by mouth 2 (two) times daily before a meal., Disp: 60 tablet, Rfl: 3   predniSONE (DELTASONE) 20 MG tablet, 3 tabs by mouth daily x 3 days, then 2 tabs by mouth daily x 2 days then 1 tab by mouth daily x 2 days, Disp: 15 tablet, Rfl: 0   RETIN-A 0.025 % cream, Apply topically at bedtime., Disp: , Rfl:    Spacer/Aero-Holding Chambers DEVI, Use with Albuterol inhaler, Disp: 1 each, Rfl: 0   Observations/Objective: Blood pressure 120/80, pulse 88, temperature 99.9 F (37.7 C), temperature source Temporal, height 5\' 4"  (1.626 m), last menstrual period 01/24/2014.  Physical Exam Constitutional:      General: The patient is not in acute distress. Pulmonary:     Effort: Pulmonary effort is normal. No respiratory distress.  Neurological:     Mental Status: The patient is alert and oriented to person, place, and time.  Psychiatric:        Mood and Affect: Mood normal.        Behavior: Behavior normal.    Assessment and  Plan COVID-19 Assessment & Plan: Acute infection.  No current bacterial superinfection. Offered consideration of antiviral given history of histoplasmosis and increased risk but she is not interested in taking antiviral at this time.  Recommended symptomatic and supportive care with cough suppressant, antipyretic and fluids.    Given past reactive airway disease and current wheeze will prescribe a course of prednisone.  If not improving as expected, low threshold for in person exam.  ER and return precautions provided.   Other orders -     predniSONE; 3 tabs by mouth daily x 3 days, then 2 tabs by mouth daily x 2 days then 1 tab by mouth daily x 2 days  Dispense: 15 tablet; Refill: 0 -     guaiFENesin-Codeine; Take 5-10 mLs by mouth at bedtime as needed for cough.  Dispense: 180 mL; Refill: 0      I discussed the  assessment and treatment plan with the patient. The patient was provided an opportunity to ask questions and all were answered. The patient agreed with the plan and demonstrated an understanding of the instructions.   The patient was advised to call back or seek an in-person evaluation if the symptoms worsen or if the condition fails to improve as anticipated.     Kerby Nora, MD

## 2023-03-01 NOTE — Assessment & Plan Note (Signed)
Acute infection.  No current bacterial superinfection. Offered consideration of antiviral given history of histoplasmosis and increased risk but she is not interested in taking antiviral at this time.  Recommended symptomatic and supportive care with cough suppressant, antipyretic and fluids.    Given past reactive airway disease and current wheeze will prescribe a course of prednisone.  If not improving as expected, low threshold for in person exam.  ER and return precautions provided.

## 2023-03-09 ENCOUNTER — Encounter (INDEPENDENT_AMBULATORY_CARE_PROVIDER_SITE_OTHER): Payer: Self-pay

## 2023-03-16 ENCOUNTER — Other Ambulatory Visit: Payer: Self-pay | Admitting: Internal Medicine

## 2023-03-21 ENCOUNTER — Other Ambulatory Visit: Payer: Self-pay | Admitting: Internal Medicine

## 2023-04-03 ENCOUNTER — Other Ambulatory Visit: Payer: Self-pay | Admitting: Family Medicine

## 2023-04-03 DIAGNOSIS — Z79899 Other long term (current) drug therapy: Secondary | ICD-10-CM

## 2023-04-03 DIAGNOSIS — Z131 Encounter for screening for diabetes mellitus: Secondary | ICD-10-CM

## 2023-04-03 DIAGNOSIS — Z13220 Encounter for screening for lipoid disorders: Secondary | ICD-10-CM

## 2023-04-03 DIAGNOSIS — E559 Vitamin D deficiency, unspecified: Secondary | ICD-10-CM

## 2023-04-06 ENCOUNTER — Other Ambulatory Visit: Payer: BC Managed Care – PPO

## 2023-04-06 ENCOUNTER — Other Ambulatory Visit (INDEPENDENT_AMBULATORY_CARE_PROVIDER_SITE_OTHER): Payer: BC Managed Care – PPO

## 2023-04-06 DIAGNOSIS — Z79899 Other long term (current) drug therapy: Secondary | ICD-10-CM

## 2023-04-06 DIAGNOSIS — E559 Vitamin D deficiency, unspecified: Secondary | ICD-10-CM

## 2023-04-06 DIAGNOSIS — Z131 Encounter for screening for diabetes mellitus: Secondary | ICD-10-CM | POA: Diagnosis not present

## 2023-04-06 DIAGNOSIS — Z13220 Encounter for screening for lipoid disorders: Secondary | ICD-10-CM | POA: Diagnosis not present

## 2023-04-06 LAB — BASIC METABOLIC PANEL
BUN: 15 mg/dL (ref 6–23)
CO2: 29 meq/L (ref 19–32)
Calcium: 9.8 mg/dL (ref 8.4–10.5)
Chloride: 104 meq/L (ref 96–112)
Creatinine, Ser: 0.82 mg/dL (ref 0.40–1.20)
GFR: 76.58 mL/min (ref >60.00)
Glucose, Bld: 80 mg/dL (ref 70–99)
Potassium: 3.9 meq/L (ref 3.5–5.1)
Sodium: 141 meq/L (ref 135–145)

## 2023-04-06 LAB — HEPATIC FUNCTION PANEL
ALT: 12 U/L (ref 0–35)
AST: 18 U/L (ref 0–37)
Albumin: 3.9 g/dL (ref 3.5–5.2)
Alkaline Phosphatase: 55 U/L (ref 39–117)
Bilirubin, Direct: 0.1 mg/dL (ref 0.0–0.3)
Total Bilirubin: 0.5 mg/dL (ref 0.2–1.2)
Total Protein: 6.1 g/dL (ref 6.0–8.3)

## 2023-04-06 LAB — HEMOGLOBIN A1C: Hgb A1c MFr Bld: 5.4 % (ref 4.6–6.5)

## 2023-04-06 LAB — CBC WITH DIFFERENTIAL/PLATELET
Basophils Absolute: 0.1 10*3/uL (ref 0.0–0.1)
Basophils Relative: 1 % (ref 0.0–3.0)
Eosinophils Absolute: 0.1 10*3/uL (ref 0.0–0.7)
Eosinophils Relative: 2 % (ref 0.0–5.0)
HCT: 41.4 % (ref 36.0–46.0)
Hemoglobin: 13.7 g/dL (ref 12.0–15.0)
Lymphocytes Relative: 48.5 % — ABNORMAL HIGH (ref 12.0–46.0)
Lymphs Abs: 2.6 10*3/uL (ref 0.7–4.0)
MCHC: 33 g/dL (ref 30.0–36.0)
MCV: 91.9 fl (ref 78.0–100.0)
Monocytes Absolute: 0.5 10*3/uL (ref 0.1–1.0)
Monocytes Relative: 9.1 % (ref 3.0–12.0)
Neutro Abs: 2.1 10*3/uL (ref 1.4–7.7)
Neutrophils Relative %: 39.4 % — ABNORMAL LOW (ref 43.0–77.0)
Platelets: 358 10*3/uL (ref 150.0–400.0)
RBC: 4.5 Mil/uL (ref 3.87–5.11)
RDW: 13.6 % (ref 11.5–15.5)
WBC: 5.3 10*3/uL (ref 4.0–10.5)

## 2023-04-06 LAB — TSH: TSH: 2.5 u[IU]/mL (ref 0.35–5.50)

## 2023-04-06 LAB — LIPID PANEL
Cholesterol: 187 mg/dL (ref 0–200)
HDL: 65 mg/dL (ref >39.00)
LDL Cholesterol: 105 mg/dL — ABNORMAL HIGH (ref 0–99)
NonHDL: 122.06
Total CHOL/HDL Ratio: 3
Triglycerides: 83 mg/dL (ref 0.0–149.0)
VLDL: 16.6 mg/dL (ref 0.0–40.0)

## 2023-04-06 LAB — VITAMIN D 25 HYDROXY (VIT D DEFICIENCY, FRACTURES): VITD: 49.05 ng/mL (ref 30.00–100.00)

## 2023-04-11 NOTE — Progress Notes (Unsigned)
Katie Todd. Katie Norell, MD, CAQ Sports Medicine [facility] [address] [place], [address]  Phone: [phone]  FAX: [phone]  Katie Todd - 62 y.o. female  MRN [id number]  Date of Birth: Feb 15, 1961  Date: 04/13/2023  PCP: Katie Beat, MD  Referral: Katie Beat, MD  No chief complaint on file.  Patient Care Team: Katie Beat, MD as PCP - General (Family Medicine) Subjective:   Katie Todd is a 62 y.o. pleasant patient who presents with the following:  Health Maintenance Summary Reviewed and updated, unless pt declines services.  Tobacco History Reviewed. Non-smoker Alcohol: No concerns, no excessive use Exercise Habits: Some activity, rec at least 30 mins 5 times a week STD concerns: none Drug Use: None Lumps or breast concerns: no  Shingrix Flu Covid booster    Health Maintenance  Topic Date Due   HIV Screening  Never done   Zoster Vaccines- Shingrix (1 of 2) Never done   COVID-19 Vaccine (6 - 2023-24 season) 03/26/2023   INFLUENZA VACCINE  10/23/2023 (Originally 02/23/2023)   MAMMOGRAM  02/15/2024   DTaP/Tdap/Td (2 - Td or Tdap) 09/15/2026   Cervical Cancer Screening (HPV/Pap Cotest)  01/20/2027   Colonoscopy  08/17/2029   Hepatitis C Screening  Completed   HPV VACCINES  Aged Out   Fecal DNA (Cologuard)  Discontinued    Immunization History  Administered Date(s) Administered   COVID-19, mRNA, vaccine(Comirnaty)12 years and older 04/30/2022   Influenza Inj Mdck Quad Pf 04/30/2022   Influenza,inj,Quad PF,6+ Mos 05/15/2017, 06/08/2018, 04/09/2019, 03/17/2020   Influenza-Unspecified 05/17/2021   PFIZER(Purple Top)SARS-COV-2 Vaccination 10/02/2019, 10/31/2019, 06/04/2020   PNEUMOCOCCAL CONJUGATE-20 02/24/2022   Pfizer Covid-19 Vaccine Bivalent Booster 48yrs & up 05/17/2021   Respiratory Syncytial Virus Vaccine,Recomb Aduvanted(Arexvy) 04/30/2022   Tdap 09/15/2016   Patient Active Problem List    Diagnosis Date Noted   FHx: SVT (supraventricular tachycardia) 05/07/2020   Family history of abdominal aortic aneurysm (AAA) x 2 d/c, [place] 08/08/2018   Chronic cough 03/21/2014   Seasonal allergies 02/23/2013   GERD (gastroesophageal reflux disease) 02/23/2013   Anxiety state, unspecified 02/23/2013   Multiple lung nodules on CT 12/09/2005    Past Medical History:  Diagnosis Date   Anxiety state, unspecified 02/23/2013   Chronic constipation    Depression    GERD (gastroesophageal reflux disease) 02/23/2013   GERD (gastroesophageal reflux disease)    Heart murmur    Histoplasmosis 2006   Insomnia    PAC (premature atrial contraction)    PVC (premature ventricular contraction)    Recurrent upper respiratory infection (URI)    Seasonal allergies 02/23/2013    Past Surgical History:  Procedure Laterality Date   COLPOSCOPY  07/25/2008   Negative   DILATION AND CURETTAGE OF UTERUS     missed ab   NOSE SURGERY     SINOSCOPY     TUBAL LIGATION      Family History  Problem Relation Age of Onset   Breast cancer Mother 99   Stroke Mother    Ovarian cancer Mother    Valvular heart disease Mother    Heart failure Father    Colon polyps Father    Stomach cancer Paternal Grandfather    Allergic rhinitis Granddaughter    Colon cancer Neg Hx     Social History   Social History Narrative   Not on file    Past Medical History, Surgical History, Social History, Family History, Problem List, Medications, and Allergies have been  reviewed and updated if relevant.  Review of Systems: Pertinent positives are listed above.  Otherwise, a full 14 point review of systems has been done in full and it is negative except where it is noted positive.  Objective:   LMP 01/24/2014 Comment: october 2014 was the one prior to this July Ideal Body Weight:   No results found.    08/10/2022   11:36 AM 05/13/2021    9:28 AM 05/11/2020    2:54 PM 08/08/2018    9:04 AM 05/17/2017   12:14  PM  Depression screen PHQ 2/9  Decreased Interest 0 0 0 0 0  Down, Depressed, Hopeless 0 0 0 0 0  PHQ - 2 Score 0 0 0 0 0  Altered sleeping   3    Tired, decreased energy   3    Change in appetite   3    Feeling bad or failure about yourself    0    Trouble concentrating   0    Moving slowly or fidgety/restless   0    Suicidal thoughts   0    PHQ-9 Score   9    Difficult doing work/chores   Somewhat difficult       GEN: well developed, well nourished, no acute distress Eyes: conjunctiva and lids normal, PERRLA, EOMI ENT: TM clear, nares clear, oral exam WNL Neck: supple, no lymphadenopathy, no thyromegaly, no JVD Pulm: clear to auscultation and percussion, respiratory effort normal CV: regular rate and rhythm, S1-S2, no murmur, rub or gallop, no bruits Chest: no scars, masses, no lumps BREAST: breast exam declined GI: soft, non-tender; no hepatosplenomegaly, masses; active bowel sounds all quadrants GU: GU exam declined Lymph: no cervical, axillary or inguinal adenopathy MSK: gait normal, muscle tone and strength WNL, no joint swelling, effusions, discoloration, crepitus  SKIN: clear, good turgor, color WNL, no rashes, lesions, or ulcerations Neuro: normal mental status, normal strength, sensation, and motion Psych: alert; oriented to person, place and time, normally interactive and not anxious or depressed in appearance.   All labs reviewed with patient. Results for orders placed or performed in visit on 04/06/23  VITAMIN D 25 Hydroxy (Vit-D Deficiency, Fractures)  Result Value Ref Range   VITD 49.05 30.00 - 100.00 ng/mL  TSH  Result Value Ref Range   TSH 2.50 0.35 - 5.50 uIU/mL  Lipid panel  Result Value Ref Range   Cholesterol 187 0 - 200 mg/dL   Triglycerides 40.9 0.0 - 149.0 mg/dL   HDL 81.19 >14.78 mg/dL   VLDL 29.5 0.0 - 62.1 mg/dL   LDL Cholesterol 308 (H) 0 - 99 mg/dL   Total CHOL/HDL Ratio 3    NonHDL 122.06   Hemoglobin A1c  Result Value Ref Range   Hgb  A1c MFr Bld 5.4 4.6 - 6.5 %  Hepatic function panel  Result Value Ref Range   Total Bilirubin 0.5 0.2 - 1.2 mg/dL   Bilirubin, Direct 0.1 0.0 - 0.3 mg/dL   Alkaline Phosphatase 55 39 - 117 U/L   AST 18 0 - 37 U/L   ALT 12 0 - 35 U/L   Total Protein 6.1 6.0 - 8.3 g/dL   Albumin 3.9 3.5 - 5.2 g/dL  CBC with Differential/Platelet  Result Value Ref Range   WBC 5.3 4.0 - 10.5 K/uL   RBC 4.50 3.87 - 5.11 Mil/uL   Hemoglobin 13.7 12.0 - 15.0 g/dL   HCT 65.7 84.6 - 96.2 %   MCV 91.9 78.0 - 100.0  fl   MCHC 33.0 30.0 - 36.0 g/dL   RDW 78.2 95.6 - 21.3 %   Platelets 358.0 150.0 - 400.0 K/uL   Neutrophils Relative % 39.4 (L) 43.0 - 77.0 %   Lymphocytes Relative 48.5 (H) 12.0 - 46.0 %   Monocytes Relative 9.1 3.0 - 12.0 %   Eosinophils Relative 2.0 0.0 - 5.0 %   Basophils Relative 1.0 0.0 - 3.0 %   Neutro Abs 2.1 1.4 - 7.7 K/uL   Lymphs Abs 2.6 0.7 - 4.0 K/uL   Monocytes Absolute 0.5 0.1 - 1.0 K/uL   Eosinophils Absolute 0.1 0.0 - 0.7 K/uL   Basophils Absolute 0.1 0.0 - 0.1 K/uL  Basic metabolic panel  Result Value Ref Range   Sodium 141 135 - 145 mEq/L   Potassium 3.9 3.5 - 5.1 mEq/L   Chloride 104 96 - 112 mEq/L   CO2 29 19 - 32 mEq/L   Glucose, Bld 80 70 - 99 mg/dL   BUN 15 6 - 23 mg/dL   Creatinine, Ser 0.86 0.40 - 1.20 mg/dL   GFR 57.84 >69.62 mL/min   Calcium 9.8 8.4 - 10.5 mg/dL   No results found.  Assessment and Plan:     ICD-10-CM   1. Healthcare maintenance  Z00.00       Health Maintenance Exam: The patient's preventative maintenance and recommended screening tests for an annual wellness exam were reviewed in full today. Brought up to date unless services declined.  Counselled on the importance of diet, exercise, and its role in overall health and mortality. The patient's FH and SH was reviewed, including their home life, tobacco status, and drug and alcohol status.  Follow-up in 1 year for physical exam or additional follow-up below.  Disposition: No  follow-ups on file.  Future Appointments  Date Time Provider Department Center  04/13/2023  2:00 PM Burech Mcfarland, Karleen Hampshire, MD [facility]    No orders of the defined types were placed in this encounter.  There are no discontinued medications. No orders of the defined types were placed in this encounter.   Signed,  Elpidio Galea. Afsa Meany, MD   Allergies as of 04/13/2023       Reactions   Penicillins Anaphylaxis        Medication List        Accurate as of April 11, 2023 12:35 PM. If you have any questions, ask your nurse or doctor.          albuterol 108 (90 Base) MCG/ACT inhaler Commonly known as: VENTOLIN HFA INHALE 2 PUFFS INTO THE LUNGS EVERY 4 HOURS AS NEEDED FOR WHEEZE   aspirin 81 MG chewable tablet Chew 81 mg by mouth daily.   Biotin 5000 MCG Tabs Take 2 tablets by mouth daily.   calcium carbonate 1250 MG capsule Take 1,250 mg by mouth 2 (two) times daily with a meal.   docusate sodium 100 MG capsule Commonly known as: COLACE Take 200 mg by mouth 2 (two) times daily.   gabapentin 100 MG capsule Commonly known as: Neurontin Take 1 capsule (100 mg total) by mouth 2 (two) times daily.   gabapentin 600 MG tablet Commonly known as: NEURONTIN TAKE 1 TABLET BY MOUTH EVERYDAY AT BEDTIME   glycerin adult 2 g suppository Place 1 suppository rectally as needed for constipation.   guaiFENesin-codeine 100-10 MG/5ML syrup Commonly known as: ROBITUSSIN AC Take 5-10 mLs by mouth at bedtime as needed for cough.   ipratropium 0.03 % nasal spray Commonly known as:  ATROVENT PLACE 2 SPRAYS INTO BOTH NOSTRILS EVERY 12 HOURS.   Linzess 290 MCG Caps capsule Generic drug: linaclotide TAKE 1 CAPSULE BY MOUTH DAILY BEFORE BREAKFAST.   metoprolol succinate 25 MG 24 hr tablet Commonly known as: TOPROL-XL Take 1 tablet (25 mg total) by mouth daily.   multivitamin tablet Take 1 tablet by mouth daily.   pantoprazole 40 MG tablet Commonly known as: PROTONIX TAKE 1  TABLET (40 MG TOTAL) BY MOUTH TWICE A DAY BEFORE MEALS   predniSONE 20 MG tablet Commonly known as: DELTASONE 3 tabs by mouth daily x 3 days, then 2 tabs by mouth daily x 2 days then 1 tab by mouth daily x 2 days   Retin-A 0.025 % cream Generic drug: tretinoin Apply topically at bedtime.   Spacer/Aero-Holding [employer] Use with Albuterol inhaler

## 2023-04-13 ENCOUNTER — Encounter: Payer: Self-pay | Admitting: Family Medicine

## 2023-04-13 ENCOUNTER — Ambulatory Visit (INDEPENDENT_AMBULATORY_CARE_PROVIDER_SITE_OTHER): Payer: BC Managed Care – PPO | Admitting: Family Medicine

## 2023-04-13 ENCOUNTER — Other Ambulatory Visit: Payer: Self-pay | Admitting: Pulmonary Disease

## 2023-04-13 ENCOUNTER — Other Ambulatory Visit: Payer: Self-pay | Admitting: Allergy & Immunology

## 2023-04-13 VITALS — BP 110/74 | HR 59 | Temp 97.9°F | Ht 64.0 in | Wt 164.0 lb

## 2023-04-13 DIAGNOSIS — N951 Menopausal and female climacteric states: Secondary | ICD-10-CM | POA: Diagnosis not present

## 2023-04-13 DIAGNOSIS — Z0000 Encounter for general adult medical examination without abnormal findings: Secondary | ICD-10-CM | POA: Diagnosis not present

## 2023-04-13 MED ORDER — IPRATROPIUM BROMIDE 0.03 % NA SOLN: 2.0000 | Freq: Two times a day (BID) | NASAL | 4 refills | Status: DC

## 2023-04-13 MED ORDER — GABAPENTIN 100 MG PO CAPS: 100.0000 mg | ORAL_CAPSULE | Freq: Two times a day (BID) | ORAL | 3 refills | Status: DC

## 2023-04-13 MED ORDER — GABAPENTIN 600 MG PO TABS
600.0000 mg | ORAL_TABLET | Freq: Every day | ORAL | 3 refills | Status: DC
Start: 2023-04-13 — End: 2024-02-26

## 2023-04-13 NOTE — Patient Instructions (Signed)
Flu vaccine    Shingrix vaccine

## 2023-07-08 ENCOUNTER — Other Ambulatory Visit: Payer: Self-pay | Admitting: Family Medicine

## 2023-07-12 DIAGNOSIS — J329 Chronic sinusitis, unspecified: Secondary | ICD-10-CM | POA: Diagnosis not present

## 2023-07-12 DIAGNOSIS — R0982 Postnasal drip: Secondary | ICD-10-CM | POA: Diagnosis not present

## 2023-07-12 DIAGNOSIS — R053 Chronic cough: Secondary | ICD-10-CM | POA: Diagnosis not present

## 2023-08-09 DIAGNOSIS — J3489 Other specified disorders of nose and nasal sinuses: Secondary | ICD-10-CM | POA: Diagnosis not present

## 2023-08-09 DIAGNOSIS — J329 Chronic sinusitis, unspecified: Secondary | ICD-10-CM | POA: Diagnosis not present

## 2023-08-09 DIAGNOSIS — R053 Chronic cough: Secondary | ICD-10-CM | POA: Diagnosis not present

## 2023-08-09 DIAGNOSIS — R0982 Postnasal drip: Secondary | ICD-10-CM | POA: Diagnosis not present

## 2023-08-17 ENCOUNTER — Telehealth: Payer: Self-pay | Admitting: *Deleted

## 2023-08-17 NOTE — Telephone Encounter (Signed)
   Pre-operative Risk Assessment    Patient Name: Katie Todd  DOB: 21-Feb-1961 MRN: [id number]   Date of last office visit: 08/31/22 DR. SCHUMANN Date of next office visit: NONE   Request for Surgical Clearance    Procedure:  SINUS SURGERY  Date of Surgery:  Clearance TBD                                Surgeon:  DR. ADAM CAMPBELL Surgeon's Group or Practice Name:  [facility] Phone number:  [phone] [address]  Fax number:  [phone]   Type of Clearance Requested:   - Medical  - Pharmacy:  Hold Aspirin     Type of Anesthesia:  General    Additional requests/questions:    Elpidio Anis   08/17/2023, 2:32 PM

## 2023-08-17 NOTE — Telephone Encounter (Signed)
Called and spoke to patient has been schedule for preop clearance

## 2023-08-17 NOTE — Telephone Encounter (Signed)
   Name: Katie Todd  DOB: 21-Mar-1961  MRN: [id number]  Primary Cardiologist: None  Chart reviewed as part of pre-operative protocol coverage. Because of Katie Todd past medical history and time since last visit, she will require a follow-up in-office visit in order to better assess preoperative cardiovascular risk.  Pre-op covering staff: - Please schedule appointment and call patient to inform them. If patient already had an upcoming appointment within acceptable timeframe, please add "pre-op clearance" to the appointment notes so provider is aware. - Please contact requesting surgeon's office via preferred method (i.e, phone, fax) to inform them of need for appointment prior to surgery.    Napoleon Form, Leodis Rains, NP  08/17/2023, 2:51 PM

## 2023-08-24 ENCOUNTER — Ambulatory Visit: Payer: Self-pay | Attending: Nurse Practitioner | Admitting: Nurse Practitioner

## 2023-08-24 ENCOUNTER — Encounter: Payer: Self-pay | Admitting: Nurse Practitioner

## 2023-08-24 VITALS — BP 118/80 | HR 75 | Ht 64.0 in | Wt 165.8 lb

## 2023-08-24 DIAGNOSIS — R002 Palpitations: Secondary | ICD-10-CM | POA: Diagnosis not present

## 2023-08-24 DIAGNOSIS — E785 Hyperlipidemia, unspecified: Secondary | ICD-10-CM | POA: Diagnosis not present

## 2023-08-24 DIAGNOSIS — I493 Ventricular premature depolarization: Secondary | ICD-10-CM

## 2023-08-24 DIAGNOSIS — Z01810 Encounter for preprocedural cardiovascular examination: Secondary | ICD-10-CM

## 2023-08-24 DIAGNOSIS — I491 Atrial premature depolarization: Secondary | ICD-10-CM

## 2023-08-24 DIAGNOSIS — I471 Supraventricular tachycardia, unspecified: Secondary | ICD-10-CM | POA: Diagnosis not present

## 2023-08-24 NOTE — Patient Instructions (Signed)
Medication Instructions:  Your physician recommends that you continue on your current medications as directed. Please refer to the Current Medication list given to you today.  *If you need a refill on your cardiac medications before your next appointment, please call your pharmacy*   Lab Work: NONE ordered at this time of appointment    Testing/Procedures: NONE ordered at this time of appointment   Follow-Up: At [facility], you and your health needs are our priority.  As part of our continuing mission to provide you with exceptional heart care, we have created designated Provider Care Teams.  These Care Teams include your primary Cardiologist (physician) and Advanced Practice Providers (APPs -  Physician Assistants and Nurse Practitioners) who all work together to provide you with the care you need, when you need it.  We recommend signing up for the patient portal called "MyChart".  Sign up information is provided on this After Visit Summary.  MyChart is used to connect with patients for Virtual Visits (Telemedicine).  Patients are able to view lab/test results, encounter notes, upcoming appointments, etc.  Non-urgent messages can be sent to your provider as well.   To learn more about what you can do with MyChart, go to [email or web address].    Your next appointment:   1 year(s)  Provider:   Little Ishikawa, MD

## 2023-08-24 NOTE — Progress Notes (Signed)
 Office Visit    Patient Name: Katie Todd Date of Encounter: 08/24/2023  Primary Care Provider:  Hannah Beat, MD Primary Cardiologist:  Little Ishikawa, MD  Chief Complaint    63 year old female with a history of palpitations, PACs, PVCs, PSVT, hyperlipidemia, GERD, depression, and anxiety who presents for follow-up related to SVT and for preoperative cardiac evaluation.  Past Medical History    Past Medical History:  Diagnosis Date   Anxiety state, unspecified 02/23/2013   Chronic constipation    Depression    GERD (gastroesophageal reflux disease) 02/23/2013   GERD (gastroesophageal reflux disease)    Heart murmur    Histoplasmosis 2006   Insomnia    PAC (premature atrial contraction)    PVC (premature ventricular contraction)    Recurrent upper respiratory infection (URI)    Seasonal allergies 02/23/2013   Past Surgical History:  Procedure Laterality Date   COLPOSCOPY  07/25/2008   Negative   DILATION AND CURETTAGE OF UTERUS     missed ab   NOSE SURGERY     SINOSCOPY     TUBAL LIGATION      Allergies  Allergies  Allergen Reactions   Penicillins Anaphylaxis     Labs/Other Studies Reviewed    The following studies were reviewed today:  Cardiac Studies & Procedures      ECHOCARDIOGRAM  ECHOCARDIOGRAM COMPLETE 01/09/2020  Narrative ECHOCARDIOGRAM REPORT    Patient Name:   Katie Todd Date of Exam: 01/09/2020 Medical Rec #:  [id number]     Height:       64.0 in Accession #:    [id number]    Weight:       134.2 lb Date of Birth:  [birth year]     BSA:          1.651 m Patient Age:    59 years      BP:           129/91 mmHg Patient Gender: F             HR:           70 bpm. Exam Location:  [address]  Procedure: 2D Echo, 3D Echo, Cardiac Doppler and Color Doppler  Indications:    R00.2 Palpitations  History:        Patient has no prior history of Echocardiogram examinations. Signs/Symptoms:Shortness of Breath and  Lightheadedness; Risk Factors:HLD.  Sonographer:    Clearence Ped RCS Referring Phys: [id number] CHRISTOPHER L SCHUMANN  IMPRESSIONS   1. Left ventricular ejection fraction, by estimation, is 50 to 55%. Left ventricular ejection fraction by 3D volume is 50 %. The left ventricle has low normal function. The left ventricle has no regional wall motion abnormalities. Left ventricular diastolic parameters were normal. 2. Right ventricular systolic function is low normal. The right ventricular size is normal. There is normal pulmonary artery systolic pressure. The estimated right ventricular systolic pressure is 19.5 mmHg. 3. The mitral valve is grossly normal. Trivial mitral valve regurgitation. No evidence of mitral stenosis. 4. The aortic valve is tricuspid. Aortic valve regurgitation is not visualized. No aortic stenosis is present. 5. The inferior vena cava is normal in size with greater than 50% respiratory variability, suggesting right atrial pressure of 3 mmHg.  FINDINGS Left Ventricle: Left ventricular ejection fraction, by estimation, is 50 to 55%. Left ventricular ejection fraction by 3D volume is 50 %. The left ventricle has low normal function. The left ventricle has no regional wall motion abnormalities. The left ventricular internal  cavity size was normal in size. There is no left ventricular hypertrophy. Left ventricular diastolic parameters were normal.  Right Ventricle: The right ventricular size is normal. No increase in right ventricular wall thickness. Right ventricular systolic function is low normal. There is normal pulmonary artery systolic pressure. The tricuspid regurgitant velocity is 2.03 m/s, and with an assumed right atrial pressure of 3 mmHg, the estimated right ventricular systolic pressure is 19.5 mmHg.  Left Atrium: Left atrial size was normal in size.  Right Atrium: Right atrial size was normal in size.  Pericardium: Trivial pericardial effusion is  present.  Mitral Valve: The mitral valve is grossly normal. Trivial mitral valve regurgitation. No evidence of mitral valve stenosis.  Tricuspid Valve: The tricuspid valve is grossly normal. Tricuspid valve regurgitation is mild . No evidence of tricuspid stenosis.  Aortic Valve: The aortic valve is tricuspid. . There is mild thickening of the aortic valve. Aortic valve regurgitation is not visualized. No aortic stenosis is present. There is mild thickening of the aortic valve.  Pulmonic Valve: The pulmonic valve was grossly normal. Pulmonic valve regurgitation is not visualized. No evidence of pulmonic stenosis.  Aorta: The aortic root and ascending aorta are structurally normal, with no evidence of dilitation.  Venous: The inferior vena cava is normal in size with greater than 50% respiratory variability, suggesting right atrial pressure of 3 mmHg.  IAS/Shunts: The atrial septum is grossly normal.   LEFT VENTRICLE PLAX 2D LVIDd:         4.59 cm         Diastology LVIDs:         3.69 cm         LV e' lateral:   5.77 cm/s LV PW:         1.06 cm         LV E/e' lateral: 8.5 LV IVS:        0.85 cm         LV e' medial:    7.18 cm/s LVOT diam:     2.00 cm         LV E/e' medial:  6.8 LV SV:         41 LV SV Index:   25 LVOT Area:     3.14 cm        3D Volume EF LV 3D EF:    Left ventricular ejection fraction by 3D volume is 50 %.  3D Volume EF: 3D EF:        50 % LV EDV:       113 ml LV ESV:       56 ml LV SV:        57 ml  RIGHT VENTRICLE RV Basal diam:  3.10 cm RV S prime:     9.03 cm/s TAPSE (M-mode): 1.6 cm RVSP:           19.5 mmHg  LEFT ATRIUM             Index       RIGHT ATRIUM LA diam:        3.00 cm 1.82 cm/m  RA Pressure: 3.00 mmHg LA Vol (A2C):   41.5 ml 25.13 ml/m LA Vol (A4C):   25.7 ml 15.56 ml/m LA Biplane Vol: 33.5 ml 20.29 ml/m AORTIC VALVE LVOT Vmax:   53.50 cm/s LVOT Vmean:  39.900 cm/s LVOT VTI:    0.129 m  AORTA Ao Root diam: 3.10  cm Ao Asc diam:  3.00  cm  MITRAL VALVE               TRICUSPID VALVE MV Area (PHT): 3.28 cm    TR Peak grad:   16.5 mmHg MV Decel Time: 231 msec    TR Vmax:        203.00 cm/s MV E velocity: 48.80 cm/s  Estimated RAP:  3.00 mmHg MV A velocity: 56.60 cm/s  RVSP:           19.5 mmHg MV E/A ratio:  0.86 SHUNTS Systemic VTI:  0.13 m Systemic Diam: 2.00 cm  Lennie Odor MD Electronically signed by Lennie Odor MD Signature Date/Time: 01/09/2020/10:10:37 AM    Final   MONITORS  LONG TERM MONITOR (3-14 DAYS) 02/17/2020  Narrative  2 episodes of NSVT, lasting 4 beats  22 episodes of SVT, longest lasting 21 seconds  Occasional PVCs (1.6%)  14 days of data recorded on Zio monitor. Patient had a min HR of 49 bpm, max HR of 197 bpm, and avg HR of 88 bpm. Predominant underlying rhythm was Sinus Rhythm. No atrial fibrillation, high degree block, or pauses noted. 2 episodes of NSVT, lasting 4 beats.  22 episodes of SVT, longest lasting 21 seconds.  Isolated atrial ectopy was rare (<1%). Isolated ventricular ectopy was occasional (1.6%).  There were 5 triggered events, which corresponded to SVT, sinus rhythm +/- PACs/PVCs.  CT SCANS  CT CARDIAC SCORING (SELF PAY ONLY) 01/07/2020  Addendum 01/07/2020  9:48 PM ADDENDUM REPORT: 01/07/2020 21:45  CLINICAL DATA:  Risk stratification  EXAM: Coronary Calcium Score  TECHNIQUE: The patient was scanned on a [employer] scanner. Axial non-contrast 3 mm slices were carried out through the heart. The data set was analyzed on a dedicated work station and scored using the Agatson method.  FINDINGS: Non-cardiac: See separate report from [facility].  Ascending Aorta:  Pericardium: Normal  Coronary arteries: Calcium score 0  IMPRESSION: Coronary calcium score of 0.   Electronically Signed By: Epifanio Lesches MD On: 01/07/2020 21:45  Narrative EXAM: OVER-READ INTERPRETATION  CT CHEST  The following report is an  over-read performed by radiologist Dr. Trudie Reed of [facility] on 01/07/2020. This over-read does not include interpretation of cardiac or coronary anatomy or pathology. The coronary calcium score interpretation by the cardiologist is attached.  COMPARISON:  None.  FINDINGS: Several small calcified granulomas are noted in the left lower lobe. Multiple densely calcified left hilar lymph nodes are incidentally noted. Within the visualized portions of the thorax there are no other suspicious appearing pulmonary nodules or masses, there is no acute consolidative airspace disease, no pleural effusions, no pneumothorax and no lymphadenopathy. Visualized portions of the upper abdomen are unremarkable. There are no aggressive appearing lytic or blastic lesions noted in the visualized portions of the skeleton.  IMPRESSION: 1. Old granulomatous disease, as above.  Electronically Signed: By: Trudie Reed M.D. On: 01/07/2020 11:32         Recent Labs: 04/06/2023: ALT 12; BUN 15; Creatinine, Ser 0.82; Hemoglobin 13.7; Platelets 358.0; Potassium 3.9; Sodium 141; TSH 2.50  Recent Lipid Panel    Component Value Date/Time   CHOL 187 04/06/2023 0752   TRIG 83.0 04/06/2023 0752   HDL 65.00 04/06/2023 0752   CHOLHDL 3 04/06/2023 0752   VLDL 16.6 04/06/2023 0752   LDLCALC 105 (H) 04/06/2023 0752    History of Present Illness    63 year old female with the above past medical history including palpitations, PACs, PVCs, PSVT, hyperlipidemia, GERD,  depression, and anxiety.  She was initially referred to cardiology in 2021 in the setting of tachycardia, shortness of breath.  Prior Holter monitor revealed rare PACs, occasional PVCs.  ABIs in 2021 were normal.  Echocardiogram in 12/2019 showed EF 50 to 55%, normal LV function, no significant valvular disease.  Zio patch in 2021 showed 22 episodes of SVT, longest lasting 21 seconds, two 4 beat episodes of NSVT, occasional PVCs.   Coronary calcium score in 12/2019 was 0.  She reported symptoms of Raynaud's that worsened after starting metoprolol.  She was switched to diltiazem with improvement.  Unfortunately, diltiazem worsened her chronic cough.  She was transitioned back to metoprolol.  She was last seen in clinic on 06/10/2022 and was doing well.  She denied any significant palpitations, rating metoprolol. She was exercising regularly.  She presents today for follow-up and for preoperative cardiac evaluation for upcoming sinus surgery with Dr. Catha Brow of [facility]. Since her last visit she has done well from a cardiac standpoint.  She notes rare fleeting palpitations, stable on metoprolol.  She denies any dizziness, presyncope or syncope.  Denies symptoms concerning for angina.  Overall, she reports feeling well.    Home Medications    Current Outpatient Medications  Medication Sig Dispense Refill   albuterol (VENTOLIN HFA) 108 (90 Base) MCG/ACT inhaler INHALE 2 PUFFS INTO THE LUNGS EVERY 4 HOURS AS NEEDED FOR WHEEZE 8.5 each 2   aspirin 81 MG chewable tablet Chew 81 mg by mouth daily.     Biotin 5000 MCG TABS Take 2 tablets by mouth daily.     calcium carbonate 1250 MG capsule Take 1,250 mg by mouth 2 (two) times daily with a meal.     docusate sodium (COLACE) 100 MG capsule Take 200 mg by mouth 2 (two) times daily.     gabapentin (NEURONTIN) 100 MG capsule Take 1 capsule (100 mg total) by mouth 2 (two) times daily. 180 capsule 3   gabapentin (NEURONTIN) 600 MG tablet Take 1 tablet (600 mg total) by mouth at bedtime. 90 tablet 3   ipratropium (ATROVENT) 0.03 % nasal spray PLACE 2 SPRAYS INTO BOTH NOSTRILS EVERY 12 (TWELVE) HOURS. 30 mL 2   LINZESS 290 MCG CAPS capsule TAKE 1 CAPSULE BY MOUTH DAILY BEFORE BREAKFAST. 90 capsule 1   metoprolol succinate (TOPROL-XL) 25 MG 24 hr tablet Take 1 tablet (25 mg total) by mouth daily. 90 tablet 1   Multiple Vitamin (MULTIVITAMIN) tablet Take 1 tablet by mouth daily.      pantoprazole (PROTONIX) 40 MG tablet TAKE 1 TABLET (40 MG TOTAL) BY MOUTH TWICE A DAY BEFORE MEALS 180 tablet 1   RETIN-A 0.025 % cream Apply topically at bedtime.     Spacer/Aero-Holding Rudean Curt Use with Albuterol inhaler 1 each 0   No current facility-administered medications for this visit.     Review of Systems   She denies chest pain, dyspnea, pnd, orthopnea, n, v, dizziness, syncope, edema, weight gain, or early satiety. All other systems reviewed and are otherwise negative except as noted above.   Physical Exam    VS:  BP 118/80   Pulse 75   Ht 5\' 4"  (1.626 m)   Wt 165 lb 12.8 oz (75.2 kg)   LMP 01/24/2014 Comment: october 2014 was the one prior to this July  SpO2 96%   BMI 28.46 kg/m  GEN: Well nourished, well developed, in no acute distress. HEENT: normal. Neck: Supple, no JVD, carotid bruits, or masses. Cardiac: RRR,  no murmurs, rubs, or gallops. No clubbing, cyanosis, edema.  Radials/DP/PT 2+ and equal bilaterally.  Respiratory:  Respirations regular and unlabored, clear to auscultation bilaterally. GI: Soft, nontender, nondistended, BS + x 4. MS: no deformity or atrophy. Skin: warm and dry, no rash. Neuro:  Strength and sensation are intact. Psych: Normal affect.  Accessory Clinical Findings    ECG personally reviewed by me today - EKG Interpretation Date/Time:  Thursday August 24 2023 13:45:59 EST Ventricular Rate:  84 PR Interval:  130 QRS Duration:  82 QT Interval:  378 QTC Calculation: 446 R Axis:   -17  Text Interpretation: Sinus rhythm with frequent and consecutive Premature ventricular complexes No previous ECGs available Confirmed by Bernadene Person ([id number]) on 08/24/2023 2:09:03 PM  - no acute changes.   Lab Results  Component Value Date   WBC 5.3 04/06/2023   HGB 13.7 04/06/2023   HCT 41.4 04/06/2023   MCV 91.9 04/06/2023   PLT 358.0 04/06/2023   Lab Results  Component Value Date   CREATININE 0.82 04/06/2023   BUN 15 04/06/2023   NA 141  04/06/2023   K 3.9 04/06/2023   CL 104 04/06/2023   CO2 29 04/06/2023   Lab Results  Component Value Date   ALT 12 04/06/2023   AST 18 04/06/2023   ALKPHOS 55 04/06/2023   BILITOT 0.5 04/06/2023   Lab Results  Component Value Date   CHOL 187 04/06/2023   HDL 65.00 04/06/2023   LDLCALC 105 (H) 04/06/2023   TRIG 83.0 04/06/2023   CHOLHDL 3 04/06/2023    Lab Results  Component Value Date   HGBA1C 5.4 04/06/2023    Assessment & Plan   1. Palpitations/PACs/PVCs/PSVT: Zio patch in 2021 showed 22 episodes of SVT, longest lasting 21 seconds, two 4 beat episodes of NSVT, occasional PVCs.  Coronary calcium score in 12/2019 was 0.  Notes rare fleeting palpitations, denies any significant symptoms.  Continue metoprolol.  2. Hyperlipidemia: LDL was 105 in 03/2023.  She is not on statin therapy at this time. The 10-year ASCVD risk score (Arnett DK, et al., 2019) is: 3%  3. Preoperative cardiac exam: According to the Revised Cardiac Risk Index (RCRI), her Perioperative Risk of Major Cardiac Event is (%): 0.4. Her Functional Capacity in METs is: 8.97 according to the Duke Activity Status Index (DASI).Therefore, based on ACC/AHA guidelines, patient would be at acceptable risk for the planned procedure without further cardiovascular testing. Per office protocol, she may hold Aspirin for 5-7 days prior to procedure. Please resume Aspirin as soon as possible postprocedure, at the discretion of the surgeon. I will route this recommendation to the requesting party via Epic fax function.  Disposition: Follow-up in 1 year.      Joylene Grapes, NP 08/24/2023, 2:11 PM

## 2023-08-25 ENCOUNTER — Encounter: Payer: Self-pay | Admitting: Nurse Practitioner

## 2023-08-29 ENCOUNTER — Other Ambulatory Visit: Payer: Self-pay | Admitting: Internal Medicine

## 2023-09-27 DIAGNOSIS — R0982 Postnasal drip: Secondary | ICD-10-CM | POA: Diagnosis not present

## 2023-09-27 DIAGNOSIS — J329 Chronic sinusitis, unspecified: Secondary | ICD-10-CM | POA: Diagnosis not present

## 2023-10-05 ENCOUNTER — Ambulatory Visit (INDEPENDENT_AMBULATORY_CARE_PROVIDER_SITE_OTHER): Payer: BC Managed Care – PPO | Admitting: Obstetrics & Gynecology

## 2023-10-05 ENCOUNTER — Encounter: Payer: Self-pay | Admitting: Obstetrics & Gynecology

## 2023-10-05 VITALS — BP 139/95 | HR 85 | Wt 164.0 lb

## 2023-10-05 DIAGNOSIS — N951 Menopausal and female climacteric states: Secondary | ICD-10-CM

## 2023-10-05 DIAGNOSIS — Z803 Family history of malignant neoplasm of breast: Secondary | ICD-10-CM

## 2023-10-05 DIAGNOSIS — R92333 Mammographic heterogeneous density, bilateral breasts: Secondary | ICD-10-CM | POA: Diagnosis not present

## 2023-10-05 DIAGNOSIS — Z1231 Encounter for screening mammogram for malignant neoplasm of breast: Secondary | ICD-10-CM

## 2023-10-05 MED ORDER — PREMARIN 0.625 MG/GM VA CREA
TOPICAL_CREAM | VAGINAL | 12 refills | Status: DC
Start: 2023-10-05 — End: 2024-04-24

## 2023-10-05 MED ORDER — MEDROXYPROGESTERONE ACETATE 2.5 MG PO TABS
2.5000 mg | ORAL_TABLET | Freq: Every day | ORAL | 0 refills | Status: DC
Start: 2023-10-05 — End: 2024-04-24

## 2023-10-05 MED ORDER — ESTRADIOL 1 MG PO TABS
1.0000 mg | ORAL_TABLET | Freq: Every day | ORAL | 2 refills | Status: DC
Start: 2023-10-05 — End: 2024-04-24

## 2023-10-05 NOTE — Patient Instructions (Signed)
 Estrogen patches (replace Estradiol pill) Climara Vivelle  Estrogen-Progesterone patch (replaces both pills) Climara Pro

## 2023-10-05 NOTE — Progress Notes (Signed)
 GYNECOLOGY OFFICE VISIT NOTE  History:   Katie Todd is a 63 y.o. [phone] here today for discussion of management of various symptoms.  Has vaginal dryness, slowing down of her metabolism, sleep disturbances, hair thinning.  Also has history of dense breasts, history of maternal breast cancer diagnosed in 67s. Wants to know about MR imaging, which MR study is appropriate.  Normal mammogram in 02/14/22.  She denies any abnormal vaginal discharge, bleeding, pelvic pain or other concerns.    Past Medical History:  Diagnosis Date   Anxiety state, unspecified 02/23/2013   Chronic constipation    Depression    GERD (gastroesophageal reflux disease) 02/23/2013   Heart murmur    Histoplasmosis 2006   Insomnia    PAC (premature atrial contraction)    PVC (premature ventricular contraction)    Recurrent upper respiratory infection (URI)    Seasonal allergies 02/23/2013    Past Surgical History:  Procedure Laterality Date   COLPOSCOPY  07/25/2008   Negative   DILATION AND CURETTAGE OF UTERUS     missed ab   NOSE SURGERY     SINOSCOPY     TUBAL LIGATION      The following portions of the patient's history were reviewed and updated as appropriate: allergies, current medications, past family history, past medical history, past social history, past surgical history and problem list.   Health Maintenance:  Normal pap and negative HRHPV on 01/19/2022.  Normal mammogram on 02/14/2022.   Review of Systems:  Pertinent items noted in HPI and remainder of comprehensive ROS otherwise negative.  Physical Exam:  BP (!) 139/95   Pulse 85   Wt 164 lb (74.4 kg)   LMP 01/24/2014 Comment: october 2014 was the one prior to this July  BMI 28.15 kg/m  CONSTITUTIONAL: Well-developed, well-nourished female in no acute distress.  HEENT:  Normocephalic, atraumatic. External right and left ear normal. No scleral icterus.  NECK: Normal range of motion, supple, no masses noted on observation SKIN: No rash  noted. Not diaphoretic. No erythema. No pallor. MUSCULOSKELETAL: Normal range of motion. No edema noted. NEUROLOGIC: Alert and oriented to person, place, and time. Normal muscle tone coordination. No cranial nerve deficit noted. PSYCHIATRIC: Normal mood and affect. Normal behavior. Normal judgment and thought content. CARDIOVASCULAR: Normal heart rate noted RESPIRATORY: Effort and breath sounds normal, no problems with respiration noted ABDOMEN: No masses noted. No other overt distention noted.   PELVIC: External genitalia with moderate atrophy otherwise normal appearing; atrophic urethral meatus and vestibular area.  No abnormal discharge noted.      Assessment and Plan:     1. Breast cancer screening by mammogram 2. FH: breast cancer in first degree relative 3. Heterogeneously dense tissue of both breasts on mammography Repeat mammogram scheduled, patient will ask while in [facility] about what the appropriate MR type would be for her dense breasts and this will be ordered accordingly. - MM 3D SCREENING MAMMOGRAM BILATERAL BREAST; Future  4. Menopausal vasomotor syndrome (Primary) Patient has many symptoms that can be attributed to menopause.  She is interested in hormone replacement therapy (HRT).  Discussed using hormone therapy and its possible benefits on her symptoms.  Also discussed concerns about side effects of increased risk of heart disease, cerebrovascular disease, thromboembolic disease, and breast cancer. Transdermal route is preferred, and she will need need both estrogen and progesterone given her intact uterus. Lowest dose prescribed as needed for symptoms. All questions answered.  Vaginal estrogen therapy recommended for vaginal dryness,  will monitor effect. Will prescribe oral formulations for now, she will find out if patches are covered by her insurance and we can change accordingly.  - estradiol (ESTRACE) 1 MG tablet; Take 1 tablet (1 mg total) by mouth daily.  Dispense:  30 tablet; Refill: 2 - medroxyPROGESTERone (PROVERA) 2.5 MG tablet; Take 1 tablet (2.5 mg total) by mouth daily.  Dispense: 30 tablet; Refill: 0 - conjugated estrogens (PREMARIN) vaginal cream; Apply to clitoral area and vulva daily for two weeks, then twice a week afterwards  Dispense: 60 g; Refill: 12   Routine preventative health maintenance measures emphasized. Please refer to After Visit Summary for other counseling recommendations.   Return in about 1 month (around 11/05/2023) for Follow up visit after HRT.    I spent 45 minutes dedicated to the care of this patient including pre-visit review of records, face to face time with the patient discussing her conditions and treatments, post visit ordering of medications and appropriate tests or procedures, coordinating care and documenting this visit encounter.    Jaynie Collins, MD, FACOG Obstetrician & Gynecologist, [facility] for [employer], [facility]

## 2023-10-07 ENCOUNTER — Other Ambulatory Visit: Payer: Self-pay | Admitting: Pulmonary Disease

## 2023-10-09 DIAGNOSIS — J343 Hypertrophy of nasal turbinates: Secondary | ICD-10-CM | POA: Diagnosis not present

## 2023-10-09 DIAGNOSIS — J320 Chronic maxillary sinusitis: Secondary | ICD-10-CM | POA: Diagnosis not present

## 2023-10-09 DIAGNOSIS — R0982 Postnasal drip: Secondary | ICD-10-CM | POA: Diagnosis not present

## 2023-10-09 DIAGNOSIS — R053 Chronic cough: Secondary | ICD-10-CM | POA: Diagnosis not present

## 2023-10-09 DIAGNOSIS — J3489 Other specified disorders of nose and nasal sinuses: Secondary | ICD-10-CM | POA: Diagnosis not present

## 2023-10-09 DIAGNOSIS — J342 Deviated nasal septum: Secondary | ICD-10-CM | POA: Diagnosis not present

## 2023-10-11 ENCOUNTER — Other Ambulatory Visit: Payer: Self-pay | Admitting: Cardiology

## 2023-10-17 DIAGNOSIS — J329 Chronic sinusitis, unspecified: Secondary | ICD-10-CM | POA: Diagnosis not present

## 2023-10-25 ENCOUNTER — Other Ambulatory Visit: Payer: Self-pay | Admitting: Internal Medicine

## 2023-10-25 ENCOUNTER — Encounter: Payer: Self-pay | Admitting: Family Medicine

## 2023-10-26 MED ORDER — LINACLOTIDE 290 MCG PO CAPS: 290.0000 ug | ORAL_CAPSULE | Freq: Every day | ORAL | 1 refills | Status: DC

## 2023-10-31 ENCOUNTER — Ambulatory Visit
Admission: RE | Admit: 2023-10-31 | Discharge: 2023-10-31 | Disposition: A | Discharge status: Home / Self Care | Source: Ambulatory Visit | Attending: Obstetrics & Gynecology | Admitting: Obstetrics & Gynecology

## 2023-10-31 DIAGNOSIS — Z803 Family history of malignant neoplasm of breast: Secondary | ICD-10-CM | POA: Diagnosis not present

## 2023-10-31 DIAGNOSIS — R92333 Mammographic heterogeneous density, bilateral breasts: Secondary | ICD-10-CM

## 2023-10-31 DIAGNOSIS — Z1231 Encounter for screening mammogram for malignant neoplasm of breast: Secondary | ICD-10-CM

## 2023-11-01 DIAGNOSIS — R5381 Other malaise: Secondary | ICD-10-CM | POA: Diagnosis not present

## 2023-11-01 DIAGNOSIS — J329 Chronic sinusitis, unspecified: Secondary | ICD-10-CM | POA: Diagnosis not present

## 2023-11-02 ENCOUNTER — Encounter: Payer: Self-pay | Admitting: Obstetrics & Gynecology

## 2023-11-07 ENCOUNTER — Ambulatory Visit: Admitting: Obstetrics & Gynecology

## 2023-11-20 ENCOUNTER — Other Ambulatory Visit: Payer: Self-pay | Admitting: Internal Medicine

## 2023-11-29 DIAGNOSIS — J329 Chronic sinusitis, unspecified: Secondary | ICD-10-CM | POA: Diagnosis not present

## 2023-11-29 DIAGNOSIS — R0982 Postnasal drip: Secondary | ICD-10-CM | POA: Diagnosis not present

## 2024-01-15 ENCOUNTER — Encounter: Payer: Self-pay | Admitting: General Practice

## 2024-01-15 ENCOUNTER — Telehealth (INDEPENDENT_AMBULATORY_CARE_PROVIDER_SITE_OTHER): Admitting: General Practice

## 2024-01-15 ENCOUNTER — Ambulatory Visit: Payer: Self-pay

## 2024-01-15 VITALS — BP 122/82 | HR 88 | Temp 98.1°F | Ht 64.0 in | Wt 165.0 lb

## 2024-01-15 DIAGNOSIS — J019 Acute sinusitis, unspecified: Secondary | ICD-10-CM | POA: Diagnosis not present

## 2024-01-15 DIAGNOSIS — R051 Acute cough: Secondary | ICD-10-CM | POA: Diagnosis not present

## 2024-01-15 DIAGNOSIS — B9689 Other specified bacterial agents as the cause of diseases classified elsewhere: Secondary | ICD-10-CM

## 2024-01-15 MED ORDER — BENZONATATE 200 MG PO CAPS
200.0000 mg | ORAL_CAPSULE | Freq: Three times a day (TID) | ORAL | 0 refills | Status: DC | PRN
Start: 2024-01-15 — End: 2024-05-08

## 2024-01-15 MED ORDER — DOXYCYCLINE HYCLATE 100 MG PO TABS: 100.0000 mg | ORAL_TABLET | Freq: Two times a day (BID) | ORAL | 0 refills | Status: AC

## 2024-01-15 NOTE — Patient Instructions (Addendum)
 Start Benzonatate  capsules for cough. Take 1 capsule by mouth three times daily as needed for cough.  Start Doxycycline  antibiotic for the infection. Take 1 tablet by mouth twice daily for 7 days.  Rest, increase fluid intake and use humidifier.   As discussed, if your symptoms worsen or do not improve, please schedule in office visit.   It was a pleasure to see you today!

## 2024-01-15 NOTE — Telephone Encounter (Signed)
 FYI Only or Action Required?: FYI only for provider.  Patient was last seen in primary care on 04/13/2023 by Watt Mirza, MD. Called Nurse Triage reporting Facial Pain. Symptoms began several weeks ago. Interventions attempted: OTC medications: sudafed, tylenol, nasal spray. Symptoms are: gradually worsening.  Triage Disposition: See HCP Within 4 Hours (Or PCP Triage)  Patient/caregiver understands and will follow disposition?: Yes     Copied from CRM [id number]. Topic: Clinical - Red Word Triage >> Jan 15, 2024 10:03 AM Lavanda D wrote: Red Word that prompted transfer to Nurse Triage: Discolored Mucus: Headache, sinus pressure, pain in teeth, low-grade fever. Thinks it is a sinus infection. A little bit of green mucus. Reason for Disposition  [1] SEVERE pain AND [2] not improved 2 hours after pain medicine  Answer Assessment - Initial Assessment Questions 1. LOCATION: Where does it hurt?      Left side.. ear ache on left side, sinus pressure behind eye, above teeth on left side 2. ONSET: When did the sinus pain start?  (e.g., hours, days)      2 weeks ago, got better and worse 3. SEVERITY: How bad is the pain?   (Scale 1-10; mild, moderate or severe)   - MILD (1-3): doesn't interfere with normal activities    - MODERATE (4-7): interferes with normal activities (e.g., work or school) or awakens from sleep   - SEVERE (8-10): excruciating pain and patient unable to do any normal activities        Ranges  5. NASAL CONGESTION: Is the nose blocked? If Yes, ask: Can you open it or must you breathe through your mouth?     yes 6. NASAL DISCHARGE: Do you have discharge from your nose? If so ask, What color?     Dark green 7. FEVER: Do you have a fever? If Yes, ask: What is it, how was it measured, and when did it start?      Low grade fever 8. OTHER SYMPTOMS: Do you have any other symptoms? (e.g., sore throat, cough, earache, difficulty breathing)     Cough, earache,  brain fog, chills   Denies: difficulty breathing, chest pain  Protocols used: Sinus Pain or Congestion-A-AH

## 2024-01-15 NOTE — Assessment & Plan Note (Signed)
 Symptoms suggestive of left sided frontal and maxillary sinusitis.  Has a history of sinus infections.  Allergy  to penicillins.  Limited physical exam due to virtual visit.   Given the length of symptoms, suspect bacterial involvement.  Start Doxycycline  antibiotic for the infection. Take 1 tablet by mouth twice daily for 7 days. Start Benzonatate  capsules for cough. Take 1 capsule by mouth three times daily as needed for cough.  Discussed using mucinex  prn.  Encourage rest, increase fluid intake and humidifier.  ER precautions provided.  F/u in office if symptoms worsen or do not improve.

## 2024-01-15 NOTE — Progress Notes (Signed)
 Virtual Visit via Video Note  I connected with Katie Todd on 01/15/24 at 12:00 PM EDT by a video enabled telemedicine application and verified that I am speaking with the correct person using two identifiers.  Patient Location: Home Provider Location: Office/Clinic  I discussed the limitations, risks, security, and privacy concerns of performing an evaluation and management service by video and the availability of in person appointments. I also discussed with the patient that there may be a patient responsible charge related to this service. The patient expressed understanding and agreed to proceed.  Subjective: PCP: Watt Mirza, MD  Chief Complaint  Patient presents with  . Cough    And pain behind left eye, left cheek pain and jaw pain on left side; pressure in ear, cough. Patient irrigated sinuses this morning and was able to get a lot of stuff out that was dark.  Patient has had a lot of fatigue, chills and fever as well. Patient sx started about 2 weeks ago. Patient has been taking ibuprofen and tylenol and sudafed. Patient has not done any at home testing.    Cough Katie Todd is a 63 year old female, patient of Dr. Watt, with past medical history of GERD, multiple lung nodules on CT, seasonal allergies, chronic cough presents today for an acute visit.   Cough: symptom onset is two weeks or longer.  Initially she had sneezing, runny nose and nasal congestion. She thought she caught a viral URI. She then developed sinus pain, sinus pressure left cheek  pain, jaw pain, pressure in left ear and pain behind left eye. She has tried sinus irrigation and dark colored phlegm and mucus.  She has had  a lot of fatigue and has been in bed the last two days Fever and chills on and off. Her fever has been around 63F.  She has been taking ibuprofen, tylenol and sudafed with minimal.   She has not done any home testing.   She is a former smoker (30 years or longer ago).  No known  hx of asthma and COPD.    ROS: Per HPI  Current Outpatient Medications:  .  benzonatate  (TESSALON ) 200 MG capsule, Take 1 capsule (200 mg total) by mouth 3 (three) times daily as needed., Disp: 20 capsule, Rfl: 0 .  doxycycline  (VIBRA -TABS) 100 MG tablet, Take 1 tablet (100 mg total) by mouth 2 (two) times daily for 7 days., Disp: 14 tablet, Rfl: 0 .  albuterol  (VENTOLIN  HFA) 108 (90 Base) MCG/ACT inhaler, INHALE 2 PUFFS INTO THE LUNGS EVERY 4 HOURS AS NEEDED FOR WHEEZE, Disp: 8.5 each, Rfl: 2 .  aspirin 81 MG chewable tablet, Chew 81 mg by mouth daily., Disp: , Rfl:  .  Biotin 5000 MCG TABS, Take 2 tablets by mouth daily., Disp: , Rfl:  .  calcium carbonate 1250 MG capsule, Take 1,250 mg by mouth 2 (two) times daily with a meal., Disp: , Rfl:  .  conjugated estrogens  (PREMARIN ) vaginal cream, Apply to clitoral area and vulva daily for two weeks, then twice a week afterwards, Disp: 60 g, Rfl: 12 .  docusate sodium (COLACE) 100 MG capsule, Take 200 mg by mouth 2 (two) times daily., Disp: , Rfl:  .  estradiol  (ESTRACE ) 1 MG tablet, Take 1 tablet (1 mg total) by mouth daily., Disp: 30 tablet, Rfl: 2 .  gabapentin  (NEURONTIN ) 100 MG capsule, Take 1 capsule (100 mg total) by mouth 2 (two) times daily., Disp: 180 capsule, Rfl: 3 .  gabapentin  (  NEURONTIN ) 600 MG tablet, Take 1 tablet (600 mg total) by mouth at bedtime., Disp: 90 tablet, Rfl: 3 .  ipratropium (ATROVENT ) 0.03 % nasal spray, PLACE 2 SPRAYS INTO BOTH NOSTRILS EVERY 12 HOURS., Disp: 90 mL, Rfl: 3 .  linaclotide  (LINZESS ) 290 MCG CAPS capsule, Take 1 capsule (290 mcg total) by mouth daily before breakfast., Disp: 90 capsule, Rfl: 1 .  medroxyPROGESTERone  (PROVERA ) 2.5 MG tablet, Take 1 tablet (2.5 mg total) by mouth daily., Disp: 30 tablet, Rfl: 0 .  metoprolol  succinate (TOPROL -XL) 25 MG 24 hr tablet, TAKE 1 TABLET (25 MG TOTAL) BY MOUTH DAILY., Disp: 90 tablet, Rfl: 3 .  Multiple Vitamin (MULTIVITAMIN) tablet, Take 1 tablet by mouth  daily., Disp: , Rfl:  .  pantoprazole  (PROTONIX ) 40 MG tablet, TAKE 1 TABLET (40 MG TOTAL) BY MOUTH TWICE A DAY BEFORE MEALS, Disp: 180 tablet, Rfl: 0 .  RETIN-A 0.025 % cream, Apply topically at bedtime., Disp: , Rfl:  .  Spacer/Aero-Holding Chambers DEVI, Use with Albuterol  inhaler, Disp: 1 each, Rfl: 0  Observations/Objective: Today's Vitals   01/15/24 1200  BP: 122/82  Pulse: 88  Temp: 98.1 F (36.7 C)  TempSrc: Oral  SpO2: 95%  Weight: 165 lb (74.8 kg)  Height: 5' 4 (1.626 m)   Physical Exam Nursing note reviewed.  Constitutional:      Appearance: Normal appearance.  HENT:     Nose:     Left Sinus: Maxillary sinus tenderness and frontal sinus tenderness present.   Eyes:     Conjunctiva/sclera: Conjunctivae normal.   Pulmonary:     Effort: Pulmonary effort is normal.   Neurological:     Mental Status: She is alert and oriented to person, place, and time.   Psychiatric:        Mood and Affect: Mood normal.        Behavior: Behavior normal.        Thought Content: Thought content normal.        Judgment: Judgment normal.    Assessment and Plan: Acute bacterial sinusitis Assessment & Plan: Symptoms suggestive of left sided frontal and maxillary sinusitis.  Has a history of sinus infections.  Allergy  to penicillins.  Limited physical exam due to virtual visit.   Given the length of symptoms, suspect bacterial involvement.  Start Doxycycline  antibiotic for the infection. Take 1 tablet by mouth twice daily for 7 days. Start Benzonatate  capsules for cough. Take 1 capsule by mouth three times daily as needed for cough.  Discussed using mucinex  prn.  Encourage rest, increase fluid intake and humidifier.  ER precautions provided.  F/u in office if symptoms worsen or do not improve.  Orders: -     Doxycycline  Hyclate; Take 1 tablet (100 mg total) by mouth 2 (two) times daily for 7 days.  Dispense: 14 tablet; Refill: 0  Acute cough Assessment & Plan: Symptoms  suggestive of left sided frontal and maxillary sinusitis.  Has a history of sinus infections.  Allergy  to penicillins.  Limited physical exam due to virtual visit.   Given the length of symptoms, suspect bacterial involvement.  Start Doxycycline  antibiotic for the infection. Take 1 tablet by mouth twice daily for 7 days. Start Benzonatate  capsules for cough. Take 1 capsule by mouth three times daily as needed for cough.  Discussed using mucinex  prn.  Encourage rest, increase fluid intake and humidifier.  ER precautions provided.  F/u in office if symptoms worsen or do not improve.  Orders: -  Doxycycline  Hyclate; Take 1 tablet (100 mg total) by mouth 2 (two) times daily for 7 days.  Dispense: 14 tablet; Refill: 0 -     Benzonatate ; Take 1 capsule (200 mg total) by mouth 3 (three) times daily as needed.  Dispense: 20 capsule; Refill: 0    Follow Up Instructions: Return if symptoms worsen or fail to improve.   I discussed the assessment and treatment plan with the patient. The patient was provided an opportunity to ask questions, and all were answered. The patient agreed with the plan and demonstrated an understanding of the instructions.   The patient was advised to call back or seek an in-person evaluation if the symptoms worsen or if the condition fails to improve as anticipated.  The above assessment and management plan was discussed with the patient. The patient verbalized understanding of and has agreed to the management plan.   Carrol Aurora, NP

## 2024-02-15 ENCOUNTER — Other Ambulatory Visit: Payer: Self-pay | Admitting: Internal Medicine

## 2024-02-17 ENCOUNTER — Encounter: Payer: Self-pay | Admitting: Family Medicine

## 2024-02-19 MED ORDER — PANTOPRAZOLE SODIUM 40 MG PO TBEC: 40.0000 mg | DELAYED_RELEASE_TABLET | Freq: Two times a day (BID) | ORAL | 0 refills | Status: DC

## 2024-02-25 ENCOUNTER — Other Ambulatory Visit: Payer: Self-pay | Admitting: Family Medicine

## 2024-02-25 DIAGNOSIS — N951 Menopausal and female climacteric states: Secondary | ICD-10-CM

## 2024-02-26 NOTE — Telephone Encounter (Signed)
 Last office visit 01/15/24 (virtual) with Carrol Aurora for acute sinusitis/cough.  Last refilled 04/13/2023 for #90 with 3 refills.  Next Appt: No future appointments.   Please schedule CPE with fasting labs prior with Dr. Watt after 04/12/2024.

## 2024-02-28 DIAGNOSIS — R0982 Postnasal drip: Secondary | ICD-10-CM | POA: Diagnosis not present

## 2024-02-28 DIAGNOSIS — R053 Chronic cough: Secondary | ICD-10-CM | POA: Diagnosis not present

## 2024-02-28 DIAGNOSIS — J329 Chronic sinusitis, unspecified: Secondary | ICD-10-CM | POA: Diagnosis not present

## 2024-03-20 DIAGNOSIS — R140 Abdominal distension (gaseous): Secondary | ICD-10-CM | POA: Diagnosis not present

## 2024-03-20 DIAGNOSIS — K5904 Chronic idiopathic constipation: Secondary | ICD-10-CM | POA: Diagnosis not present

## 2024-03-26 DIAGNOSIS — H61001 Unspecified perichondritis of right external ear: Secondary | ICD-10-CM | POA: Diagnosis not present

## 2024-03-26 DIAGNOSIS — L218 Other seborrheic dermatitis: Secondary | ICD-10-CM | POA: Diagnosis not present

## 2024-04-10 ENCOUNTER — Ambulatory Visit: Payer: Self-pay

## 2024-04-10 NOTE — Telephone Encounter (Signed)
 Spoke with Katie Todd. She has been scheduled to see Tabitha on 04/11/24 at 1000.

## 2024-04-10 NOTE — Telephone Encounter (Signed)
 Considering blurry vision, large enlarging 'lump' and eye almost swollen shut due to swelling pt should be seen immediately to be assessed.

## 2024-04-10 NOTE — Telephone Encounter (Signed)
 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 Pt said this has been going on for weeks and she is not going to UC or ED.pt said the blurred vision is due to her eyes watering a clear liquid.pt is having chills on nd off but no fever. Pt said the white of her eyes are pink and pt has a dry cough. Pt said she will keep appt with T Dugal FNP on 04/11/24  and if pt condition worsens she will go to ED. Sending note to ONEIDA Patrick FNP who is out of office and Dr Watt as PCP and [place] pol.

## 2024-04-10 NOTE — Telephone Encounter (Signed)
 FYI Only or Action Required?: Action required by provider: request for appointment.  Patient was last seen in primary care on 01/15/2024 by Vincente Shivers, NP.  Called Nurse Triage reporting Facial Swelling.  Symptoms began several weeks ago.  Interventions attempted: Rest, hydration, or home remedies.  Symptoms are: gradually worsening. Requests to see Dr. Watt only, please adcise pt.  Triage Disposition: See PCP When Office is Open (Within 3 Days)  Patient/caregiver understands and will follow disposition?: Yes   Copied from CRM #[id number]. Topic: Clinical - Red Word Triage >> Apr 10, 2024 12:22 PM Roselie BROCKS wrote: Red Word that prompted transfer to Nurse Triage: patients left eye is blurry and very swollen almost shut,left cheek is very swollen and very painful, has a huge raised lump in upper check Reason for Disposition  [1] MILD face swelling (e.g., puffiness) AND [2] persists > 3 days  Answer Assessment - Initial Assessment Questions 1. ONSET: When did the swelling start? (e.g., minutes, hours, days)     3-4 weeks ago 2. LOCATION: What part of the face is swollen? (e.g., cheek, entire face, jaw joint area, under jaw)     Left eye and cheek 3. SEVERITY: How swollen is it?     Upper eyelids 4. ITCHING: Is there any itching? If Yes, ask: How much?   (Scale 1-10; mild, moderate or severe)     mild 5. PAIN: Is the swelling painful to touch? If Yes, ask: How painful is it?   (Scale 0-10; mild, moderate or severe)     In cheek 6. FEVER: Do you have a fever? If Yes, ask: What is it, how was it measured, and when did it start?      no 7. CAUSE: What do you think is causing the face swelling?     unsure 8. NEW MEDICINES: Have there been any new medicines started recently?     no 9. RECURRENT SYMPTOM: Have you had face swelling before? If Yes, ask: When was the last time? What happened that time?     yes 10. OTHER SYMPTOMS: Do you have any other  symptoms? (e.g., leg swelling, toothache)       chills 11. PREGNANCY: Is there any chance you are pregnant? When was your last menstrual period?       no  Protocols used: Face Swelling-A-AH

## 2024-04-11 ENCOUNTER — Ambulatory Visit (INDEPENDENT_AMBULATORY_CARE_PROVIDER_SITE_OTHER): Admitting: Family

## 2024-04-11 ENCOUNTER — Telehealth: Payer: Self-pay | Admitting: *Deleted

## 2024-04-11 ENCOUNTER — Encounter: Payer: Self-pay | Admitting: Family

## 2024-04-11 VITALS — BP 112/78 | HR 81 | Temp 98.4°F | Ht 64.0 in | Wt 160.4 lb

## 2024-04-11 DIAGNOSIS — Z131 Encounter for screening for diabetes mellitus: Secondary | ICD-10-CM

## 2024-04-11 DIAGNOSIS — H1033 Unspecified acute conjunctivitis, bilateral: Secondary | ICD-10-CM

## 2024-04-11 DIAGNOSIS — Z13220 Encounter for screening for lipoid disorders: Secondary | ICD-10-CM

## 2024-04-11 DIAGNOSIS — J0101 Acute recurrent maxillary sinusitis: Secondary | ICD-10-CM | POA: Insufficient documentation

## 2024-04-11 DIAGNOSIS — E559 Vitamin D deficiency, unspecified: Secondary | ICD-10-CM

## 2024-04-11 DIAGNOSIS — Z79899 Other long term (current) drug therapy: Secondary | ICD-10-CM

## 2024-04-11 MED ORDER — DOXYCYCLINE HYCLATE 100 MG PO TABS: 100.0000 mg | ORAL_TABLET | Freq: Two times a day (BID) | ORAL | 0 refills | Status: AC

## 2024-04-11 MED ORDER — OFLOXACIN 0.3 % OP SOLN: 1.0000 [drp] | Freq: Four times a day (QID) | OPHTHALMIC | 0 refills | Status: AC

## 2024-04-11 NOTE — Telephone Encounter (Signed)
-----   Message from Veva JINNY Ferrari sent at 04/04/2024  3:15 PM EDT ----- Regarding: Lab orders for Wed, 9.24.25 Patient is scheduled for CPX labs, please order future labs, Thanks , Veva

## 2024-04-11 NOTE — Telephone Encounter (Signed)
 NOTED

## 2024-04-11 NOTE — Progress Notes (Signed)
 Established Patient Office Visit  Subjective:      CC:  Chief Complaint  Patient presents with   Acute Visit    Facial swelling x2-3 weeks    HPI: Katie Todd is a 63 y.o. female presenting on 04/11/2024 for Acute Visit (Facial swelling x2-3 weeks) .  Discussed the use of AI scribe software for clinical note transcription with the patient, who gave verbal consent to proceed.  History of Present Illness Katie Todd is a 63 year old female with chronic sinusitis who presents with worsening eye symptoms and cheek pain.  A few weeks ago, her eyelids began swelling, accompanied by drainage and a scaly rash. She initially consulted a dermatologist who prescribed ketoconazole, which she used for two and a half weeks without improvement. The rash persists, is not itchy, but is scaly, and her eyelids remain swollen. She also notes that the whites of her eyes have turned pink, and she experiences crusting in the mornings.  She has a history of chronic sinusitis and underwent sinus surgery in February. Recently, she developed pain in her left cheek and above her teeth, similar to previous sinus issues. She describes a sensation of water in her ear and occasional itchiness. She has been using a neti pot daily to manage her sinus symptoms.  She has a history of chronic cough, which has improved since her sinus surgery, but she notes a recurrence of coughing recently. She uses Atrovent  nasal spray and Zyrtec for her symptoms. She has an emergency inhaler for bronchospasms, which she uses when her cough is severe.  She has a history of granulomatous disease from living in [place] , where she was infected twice with histoplasmosis. 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         Social history:  Relevant past medical, surgical, family and social history reviewed and updated as indicated. Interim medical history since  our last visit reviewed.  Allergies and medications reviewed and updated.  DATA REVIEWED: CHART IN EPIC     ROS: Negative unless specifically indicated above in HPI.    Current Outpatient Medications:    albuterol  (VENTOLIN  HFA) 108 (90 Base) MCG/ACT inhaler, INHALE 2 PUFFS INTO THE LUNGS EVERY 4 HOURS AS NEEDED FOR WHEEZE, Disp: 8.5 each, Rfl: 2   aspirin 81 MG chewable tablet, Chew 81 mg by mouth daily., Disp: , Rfl:    benzonatate  (TESSALON ) 200 MG capsule, Take 1 capsule (200 mg total) by mouth 3 (three) times daily as needed., Disp: 20 capsule, Rfl: 0   Biotin 5000 MCG TABS, Take 2 tablets by mouth daily., Disp: , Rfl:    calcium carbonate 1250 MG capsule, Take 1,250 mg by mouth 2 (two) times daily with a meal., Disp: , Rfl:    cetirizine (ZYRTEC) 10 MG tablet, Take 10 mg by mouth daily., Disp: , Rfl:    conjugated estrogens  (PREMARIN ) vaginal cream, Apply to clitoral area and vulva daily for two weeks, then twice a week afterwards, Disp: 60 g, Rfl: 12   docusate sodium (COLACE) 100 MG capsule, Take 200 mg by mouth 2 (two) times daily., Disp: , Rfl:    doxycycline  (VIBRA -TABS) 100 MG tablet, Take 1 tablet (100 mg total) by mouth 2 (two) times daily for 10 days., Disp: 20 tablet, Rfl: 0   estradiol  (ESTRACE ) 1 MG tablet, Take 1 tablet (1 mg total) by mouth daily., Disp: 30 tablet, Rfl: 2   gabapentin  (  NEURONTIN ) 100 MG capsule, Take 1 capsule (100 mg total) by mouth 2 (two) times daily., Disp: 180 capsule, Rfl: 3   gabapentin  (NEURONTIN ) 600 MG tablet, TAKE 1 TABLET BY MOUTH AT BEDTIME., Disp: 90 tablet, Rfl: 3   ipratropium (ATROVENT ) 0.03 % nasal spray, PLACE 2 SPRAYS INTO BOTH NOSTRILS EVERY 12 HOURS., Disp: 90 mL, Rfl: 3   linaclotide  (LINZESS ) 290 MCG CAPS capsule, TAKE 1 CAPSULE BY MOUTH DAILY BEFORE BREAKFAST., Disp: 90 capsule, Rfl: 0   medroxyPROGESTERone  (PROVERA ) 2.5 MG tablet, Take 1 tablet (2.5 mg total) by mouth daily., Disp: 30 tablet, Rfl: 0   metoprolol  succinate  (TOPROL -XL) 25 MG 24 hr tablet, TAKE 1 TABLET (25 MG TOTAL) BY MOUTH DAILY., Disp: 90 tablet, Rfl: 3   Multiple Vitamin (MULTIVITAMIN) tablet, Take 1 tablet by mouth daily., Disp: , Rfl:    ofloxacin  (OCUFLOX ) 0.3 % ophthalmic solution, Place 1 drop into both eyes 4 (four) times daily for 7 days., Disp: 1.4 mL, Rfl: 0   pantoprazole  (PROTONIX ) 40 MG tablet, Take 1 tablet (40 mg total) by mouth 2 (two) times daily., Disp: 180 tablet, Rfl: 0   RETIN-A 0.025 % cream, Apply topically at bedtime., Disp: , Rfl:    Spacer/Aero-Holding Chambers DEVI, Use with Albuterol  inhaler, Disp: 1 each, Rfl: 0        Objective:        BP 112/78 (BP Location: Left Arm, Patient Position: Sitting, Cuff Size: Normal)   Pulse 81   Temp 98.4 F (36.9 C) (Temporal)   Ht 5' 4 (1.626 m)   Wt 160 lb 6.4 oz (72.8 kg)   LMP 01/24/2014 Comment: october 2014 was the one prior to this July  SpO2 98%   BMI 27.53 kg/m   Physical Exam HEENT: Fluid behind tympanic membrane. Nasal passages patent. Tenderness in left cheek. CHEST: Lungs clear to auscultation bilaterally.  Wt Readings from Last 3 Encounters:  04/11/24 160 lb 6.4 oz (72.8 kg)  01/15/24 165 lb (74.8 kg)  10/05/23 164 lb (74.4 kg)    Physical Exam Constitutional:      General: She is not in acute distress.    Appearance: Normal appearance. She is normal weight. She is not ill-appearing, toxic-appearing or diaphoretic.  HENT:     Head: Normocephalic.     Right Ear: Tympanic membrane normal.     Left Ear: Tympanic membrane normal.     Nose: Nose normal.     Mouth/Throat:     Mouth: Mucous membranes are dry.     Pharynx: No oropharyngeal exudate or posterior oropharyngeal erythema.  Eyes:     Extraocular Movements: Extraocular movements intact.     Pupils: Pupils are equal, round, and reactive to light.  Cardiovascular:     Rate and Rhythm: Normal rate and regular rhythm.     Pulses: Normal pulses.     Heart sounds: Normal heart sounds.   Pulmonary:     Effort: Pulmonary effort is normal.     Breath sounds: Normal breath sounds.  Musculoskeletal:     Cervical back: Normal range of motion.  Neurological:     General: No focal deficit present.     Mental Status: She is alert and oriented to person, place, and time. Mental status is at baseline.  Psychiatric:        Mood and Affect: Mood normal.        Behavior: Behavior normal.        Thought Content: Thought content normal.  Judgment: Judgment normal.          Results LABS Sinus Culture: Morganella species identified (08/2023)  RADIOLOGY Sinus X-ray: Accessory sinus present, sinusitis evident (08/2023) Lung CT: Chronic bronchitis, bronchial dilation, scar tissue present  PATHOLOGY Cytology: Histoplasmosis confirmed  Assessment & Plan:   Assessment and Plan Assessment & Plan Chronic sinusitis, status post sinus surgery Recurrent symptoms of sinusitis with left cheek pain, eyelid swelling, and drainage. Recent symptoms suggestive of sinusitis recurrence, possibly triggered by exposure to children at church. No fever reported. Fluid behind the tympanic membrane indicates possible sinus involvement. - Consider referral to ENT if symptoms persist after treatment.  Chronic bronchitis Chronic bronchitis with dilated bronchial tubes and scar tissue on CT scan. Cough improved since sinus surgery but persists, especially at night. No current need for emergency inhaler as bronchospasms have decreased.  Allergic conjunctivitis and eyelid dermatitis Eyelid swelling, drainage, and scaly rash present for a few weeks. Symptoms worsened despite ketoconazole use. Morning crusting and blurriness suggest allergic conjunctivitis. No yellow discharge, but significant white discharge noted. - Prescribe doxycycline  to address underlying infection. - Provide eye drops for symptomatic relief of conjunctivitis.  Allergic rhinitis Allergic rhinitis managed with Atrovent   nasal spray and Zyrtec.  Recording duration: 14 minutes      Return for f/u PCP if no improvement in symptoms.     Ginger Patrick, MSN, APRN, FNP-C [facility]

## 2024-04-17 ENCOUNTER — Other Ambulatory Visit (INDEPENDENT_AMBULATORY_CARE_PROVIDER_SITE_OTHER)

## 2024-04-17 DIAGNOSIS — Z79899 Other long term (current) drug therapy: Secondary | ICD-10-CM

## 2024-04-17 DIAGNOSIS — E559 Vitamin D deficiency, unspecified: Secondary | ICD-10-CM | POA: Diagnosis not present

## 2024-04-17 DIAGNOSIS — Z13220 Encounter for screening for lipoid disorders: Secondary | ICD-10-CM | POA: Diagnosis not present

## 2024-04-17 DIAGNOSIS — Z131 Encounter for screening for diabetes mellitus: Secondary | ICD-10-CM | POA: Diagnosis not present

## 2024-04-17 LAB — LIPID PANEL
Cholesterol: 190 mg/dL (ref 0–200)
HDL: 64.3 mg/dL (ref >39.00)
LDL Cholesterol: 114 mg/dL — ABNORMAL HIGH (ref 0–99)
NonHDL: 125.65
Total CHOL/HDL Ratio: 3
Triglycerides: 56 mg/dL (ref 0.0–149.0)
VLDL: 11.2 mg/dL (ref 0.0–40.0)

## 2024-04-17 LAB — CBC WITH DIFFERENTIAL/PLATELET
Basophils Absolute: 0 K/uL (ref 0.0–0.1)
Basophils Relative: 0.8 % (ref 0.0–3.0)
Eosinophils Absolute: 0 K/uL (ref 0.0–0.7)
Eosinophils Relative: 0.9 % (ref 0.0–5.0)
HCT: 41.7 % (ref 36.0–46.0)
Hemoglobin: 14.2 g/dL (ref 12.0–15.0)
Lymphocytes Relative: 44.3 % (ref 12.0–46.0)
Lymphs Abs: 1.8 K/uL (ref 0.7–4.0)
MCHC: 34 g/dL (ref 30.0–36.0)
MCV: 90.6 fl (ref 78.0–100.0)
Monocytes Absolute: 0.4 K/uL (ref 0.1–1.0)
Monocytes Relative: 9.4 % (ref 3.0–12.0)
Neutro Abs: 1.8 K/uL (ref 1.4–7.7)
Neutrophils Relative %: 44.6 % (ref 43.0–77.0)
Platelets: 310 K/uL (ref 150.0–400.0)
RBC: 4.6 Mil/uL (ref 3.87–5.11)
RDW: 13.4 % (ref 11.5–15.5)
WBC: 4 K/uL (ref 4.0–10.5)

## 2024-04-17 LAB — HEPATIC FUNCTION PANEL
ALT: 11 U/L (ref 0–35)
AST: 17 U/L (ref 0–37)
Albumin: 4.4 g/dL (ref 3.5–5.2)
Alkaline Phosphatase: 58 U/L (ref 39–117)
Bilirubin, Direct: 0.1 mg/dL (ref 0.0–0.3)
Total Bilirubin: 0.5 mg/dL (ref 0.2–1.2)
Total Protein: 6.4 g/dL (ref 6.0–8.3)

## 2024-04-17 LAB — TSH: TSH: 3.14 u[IU]/mL (ref 0.35–5.50)

## 2024-04-17 LAB — BASIC METABOLIC PANEL WITH GFR
BUN: 18 mg/dL (ref 6–23)
CO2: 31 meq/L (ref 19–32)
Calcium: 10 mg/dL (ref 8.4–10.5)
Chloride: 104 meq/L (ref 96–112)
Creatinine, Ser: 0.72 mg/dL (ref 0.40–1.20)
GFR: 88.86 mL/min (ref >60.00)
Glucose, Bld: 80 mg/dL (ref 70–99)
Potassium: 4.1 meq/L (ref 3.5–5.1)
Sodium: 141 meq/L (ref 135–145)

## 2024-04-17 LAB — VITAMIN D 25 HYDROXY (VIT D DEFICIENCY, FRACTURES): VITD: 42.46 ng/mL (ref 30.00–100.00)

## 2024-04-17 LAB — HEMOGLOBIN A1C: Hgb A1c MFr Bld: 5.6 % (ref 4.6–6.5)

## 2024-04-21 ENCOUNTER — Encounter: Payer: Self-pay | Admitting: Family Medicine

## 2024-04-21 NOTE — Progress Notes (Signed)
 "    Katie Denman T. Katie Allmendinger, MD, CAQ Sports Medicine [facility] at [facility] [address] [place], [address]  Phone: [phone]  FAX: [phone]  Katie Todd - 63 y.o. female  MRN [id number]  Date of Birth: [birth year]  Date: 04/24/2024  PCP: Watt Mirza, MD  Referral: Watt Mirza, MD  Chief Complaint  Patient presents with   Annual Exam   Patient Care Team: Watt Mirza, MD as PCP - General (Family Medicine) Kate Lonni CROME, MD as PCP - Cardiology (Cardiology) Subjective:   Katie Todd is a 63 y.o. pleasant patient who presents with the following:  Discussed the use of AI scribe software for clinical note transcription with the patient, who gave verbal consent to proceed.  History of Present Illness Katie Todd is a 62 year old female who presents for a regular physical exam and follow-up on constipation management.  She experiences ongoing constipation, having bowel movements only once every ten days while using Linzess  and 'Smooth Moves' tea containing Senna. Cramping and bloating accompany her infrequent bowel movements, and antibiotics worsen her constipation.  She is physically active, using an elliptical for an hour a day, five days a week. She has lost ten pounds over two months by maintaining a diet of 1000 calories per day, though she finds the weight loss slow.  She has a history of GERD, which she describes as 'horrible'.  Her family history includes her mother having breast cancer in her eighties, but no other significant family history of cancer among her five sisters.  She denies smoking, vaping, or alcohol use. 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  She reports a slight cough that has improved since nasal surgery.    Health Maintenance Summary Reviewed and updated, unless pt declines services.  Tobacco  History Reviewed. Non-smoker Alcohol: No concerns, no excessive use Exercise Habits: Some activity, rec at least 30 mins 5 times a week -Daily, elliptical STD concerns: none Drug Use: None Lumps or breast concerns: no  She is going to get her vaccination at the pharmacy Shingrix COVID booster Flu     Mammogram and cervical cancer screening are up-to-date  Health Maintenance  Topic Date Due   HIV Screening  Never done   Zoster Vaccines- Shingrix (1 of 2) Never done   COVID-19 Vaccine (6 - 2025-26 season) 03/25/2024   Influenza Vaccine  10/22/2024 (Originally 02/23/2024)   Mammogram  10/30/2025   DTaP/Tdap/Td (2 - Td or Tdap) 09/15/2026   Cervical Cancer Screening (HPV/Pap Cotest)  01/20/2027   Colonoscopy  08/17/2029   Pneumococcal Vaccine: 50+ Years  Completed   Hepatitis C Screening  Completed   Hepatitis B Vaccines 19-59 Average Risk  Aged Out   HPV VACCINES  Aged Out   Meningococcal B Vaccine  Aged Out   Fecal DNA (Cologuard)  Discontinued    Immunization History  Administered Date(s) Administered   Influenza Inj Mdck Quad Pf 04/30/2022   Influenza, Seasonal, Injecte, Preservative Fre 05/23/2023   Influenza,inj,Quad PF,6+ Mos 05/15/2017, 06/08/2018, 04/09/2019, 03/17/2020   Influenza-Unspecified 05/17/2021   PFIZER(Purple Top)SARS-COV-2 Vaccination 10/02/2019, 10/31/2019, 06/04/2020   PNEUMOCOCCAL CONJUGATE-20 02/24/2022   Pfizer Covid-19 Vaccine Bivalent Booster 54yrs & up 05/17/2021   Pfizer(Comirnaty)Fall Seasonal Vaccine 12 years and older 04/30/2022   Respiratory Syncytial Virus Vaccine,Recomb Aduvanted(Arexvy) 04/30/2022   Tdap 09/15/2016   Patient Active Problem List   Diagnosis Date Noted  Chronic cough 03/21/2014    Priority: Low   Seasonal allergies 02/23/2013    Priority: Low   GERD (gastroesophageal reflux disease) 02/23/2013    Priority: Low   Chronic constipation 04/28/1993    Priority: Low   FHx: SVT (supraventricular tachycardia) 05/07/2020    Family history of abdominal aortic aneurysm (AAA) x 2 d/c, [place] 08/08/2018   Anxiety state 02/23/2013   Multiple lung nodules on CT 12/09/2005    Past Medical History:  Diagnosis Date   Anxiety state, unspecified 02/23/2013   Chronic constipation    Depression    GERD (gastroesophageal reflux disease) 02/23/2013   Heart murmur    Histoplasmosis 2006   Insomnia    PAC (premature atrial contraction)    PVC (premature ventricular contraction)    Seasonal allergies 02/23/2013    Past Surgical History:  Procedure Laterality Date   COLPOSCOPY  07/25/2008   Negative   DILATION AND CURETTAGE OF UTERUS     missed ab   NOSE SURGERY     SINOSCOPY     TUBAL LIGATION      Family History  Problem Relation Age of Onset   Breast cancer Mother 1   Stroke Mother    Ovarian cancer Mother    Valvular heart disease Mother    Heart failure Father    Colon polyps Father    Stomach cancer Paternal Grandfather    Allergic rhinitis Granddaughter    Colon cancer Neg Hx     Social History   Social History Narrative   Not on file    Past Medical History, Surgical History, Social History, Family History, Problem List, Medications, and Allergies have been reviewed and updated if relevant.  Review of Systems: Pertinent positives are listed above.  Otherwise, a full 14 point review of systems has been done in full and it is negative except where it is noted positive.  Objective:   BP 106/63   Pulse 61   Temp 98 F (36.7 C) (Temporal)   Ht 5' 3.75 (1.619 m)   Wt 159 lb 8 oz (72.3 kg)   LMP 01/24/2014 Comment: october 2014 was the one prior to this July  SpO2 98%   BMI 27.59 kg/m  Ideal Body Weight: Weight in (lb) to have BMI = 25: 144.2 No results found.    04/24/2024    9:23 AM 08/10/2022   11:36 AM 05/13/2021    9:28 AM 05/11/2020    2:54 PM 08/08/2018    9:04 AM  Depression screen PHQ 2/9  Decreased Interest 0 0 0 0 0  Down, Depressed, Hopeless 0 0 0 0 0  PHQ - 2  Score 0 0 0 0 0  Altered sleeping    3   Tired, decreased energy    3   Change in appetite    3   Feeling bad or failure about yourself     0   Trouble concentrating    0   Moving slowly or fidgety/restless    0   Suicidal thoughts    0   PHQ-9 Score    9   Difficult doing work/chores    Somewhat difficult      GEN: well developed, well nourished, no acute distress Eyes: conjunctiva and lids normal, PERRLA, EOMI ENT: TM clear, nares clear, oral exam WNL Neck: supple, no lymphadenopathy, no thyromegaly, no JVD Pulm: clear to auscultation and percussion, respiratory effort normal CV: regular rate and rhythm, S1-S2, no murmur, rub or gallop,  no bruits Chest: no scars, masses, no lumps BREAST: breast exam declined GI: soft, non-tender; no hepatosplenomegaly, masses; active bowel sounds all quadrants GU: GU exam declined Lymph: no cervical, axillary or inguinal adenopathy MSK: gait normal, muscle tone and strength WNL, no joint swelling, effusions, discoloration, crepitus  SKIN: clear, good turgor, color WNL, no rashes, lesions, or ulcerations Neuro: normal mental status, normal strength, sensation, and motion Psych: alert; oriented to person, place and time, normally interactive and not anxious or depressed in appearance.   All labs reviewed with patient. Results for orders placed or performed in visit on 04/17/24  VITAMIN D  25 Hydroxy (Vit-D Deficiency, Fractures)   Collection Time: 04/17/24  8:31 AM  Result Value Ref Range   VITD 42.46 30.00 - 100.00 ng/mL  TSH   Collection Time: 04/17/24  8:31 AM  Result Value Ref Range   TSH 3.14 0.35 - 5.50 uIU/mL  Lipid panel   Collection Time: 04/17/24  8:31 AM  Result Value Ref Range   Cholesterol 190 0 - 200 mg/dL   Triglycerides 43.9 0.0 - 149.0 mg/dL   HDL 35.69 >60.99 mg/dL   VLDL 88.7 0.0 - 59.9 mg/dL   LDL Cholesterol 885 (H) 0 - 99 mg/dL   Total CHOL/HDL Ratio 3    NonHDL 125.65   Hemoglobin A1c   Collection Time:  04/17/24  8:31 AM  Result Value Ref Range   Hgb A1c MFr Bld 5.6 4.6 - 6.5 %  Hepatic function panel   Collection Time: 04/17/24  8:31 AM  Result Value Ref Range   Total Bilirubin 0.5 0.2 - 1.2 mg/dL   Bilirubin, Direct 0.1 0.0 - 0.3 mg/dL   Alkaline Phosphatase 58 39 - 117 U/L   AST 17 0 - 37 U/L   ALT 11 0 - 35 U/L   Total Protein 6.4 6.0 - 8.3 g/dL   Albumin 4.4 3.5 - 5.2 g/dL  CBC with Differential/Platelet   Collection Time: 04/17/24  8:31 AM  Result Value Ref Range   WBC 4.0 4.0 - 10.5 K/uL   RBC 4.60 3.87 - 5.11 Mil/uL   Hemoglobin 14.2 12.0 - 15.0 g/dL   HCT 58.2 63.9 - 53.9 %   MCV 90.6 78.0 - 100.0 fl   MCHC 34.0 30.0 - 36.0 g/dL   RDW 86.5 88.4 - 84.4 %   Platelets 310.0 150.0 - 400.0 K/uL   Neutrophils Relative % 44.6 43.0 - 77.0 %   Lymphocytes Relative 44.3 12.0 - 46.0 %   Monocytes Relative 9.4 3.0 - 12.0 %   Eosinophils Relative 0.9 0.0 - 5.0 %   Basophils Relative 0.8 0.0 - 3.0 %   Neutro Abs 1.8 1.4 - 7.7 K/uL   Lymphs Abs 1.8 0.7 - 4.0 K/uL   Monocytes Absolute 0.4 0.1 - 1.0 K/uL   Eosinophils Absolute 0.0 0.0 - 0.7 K/uL   Basophils Absolute 0.0 0.0 - 0.1 K/uL  Basic metabolic panel   Collection Time: 04/17/24  8:31 AM  Result Value Ref Range   Sodium 141 135 - 145 mEq/L   Potassium 4.1 3.5 - 5.1 mEq/L   Chloride 104 96 - 112 mEq/L   CO2 31 19 - 32 mEq/L   Glucose, Bld 80 70 - 99 mg/dL   BUN 18 6 - 23 mg/dL   Creatinine, Ser 9.27 0.40 - 1.20 mg/dL   GFR 11.13 >39.99 mL/min   Calcium 10.0 8.4 - 10.5 mg/dL   No results found.  Assessment and Plan:  ICD-10-CM   1. Healthcare maintenance  Z00.00      Assessment & Plan Adult Wellness Visit Blood pressure controlled. Cholesterol slightly elevated, low cardiovascular risk due to zero coronary calcium score. Normal kidney function, vitamin D , and TSH. BMI 27.5. Regular physical activity and low-calorie diet maintained. GLP-1 agonists for weight management considered, insurance coverage  uncertain. - Continue exercise: elliptical 1 hour/day, 5 days/week. - Maintain diet: 1000 calories/day. - Consider GLP-1 agonists for weight loss. - Monitor cholesterol; no medication needed.  Chronic constipation Chronic constipation with slow motility. Linzess  and Smooth Moves tea with Senna used. Bowel movements every ten days with Linzess  alone, improved with Smooth Moves. Recent antibiotics may have worsened constipation. Hemorrhoids present. - Continue Linzess  and Smooth Moves tea with Senna. - Consider adjusting Linzess  dosage based on response.  Chronic cough Chronic cough improved post-nasal surgery and RYNAIRE procedure. Procedure self-funded due to lack of insurance coverage.  General Health Maintenance Mammogram, cervical cancer screening, and colonoscopy up to date. Plans for flu and shingles vaccines. - Receive flu and shingles vaccines at [facility].  Health Maintenance Exam: The patient's preventative maintenance and recommended screening tests for an annual wellness exam were reviewed in full today. Brought up to date unless services declined.  Counselled on the importance of diet, exercise, and its role in overall health and mortality. The patient's FH and SH was reviewed, including their home life, tobacco status, and drug and alcohol status.  Follow-up in 1 year for physical exam or additional follow-up below.  Disposition: No follow-ups on file.  No future appointments.   No orders of the defined types were placed in this encounter.  Medications Discontinued During This Encounter  Medication Reason   estradiol  (ESTRACE ) 1 MG tablet Completed Course   medroxyPROGESTERone  (PROVERA ) 2.5 MG tablet Completed Course   conjugated estrogens  (PREMARIN ) vaginal cream Completed Course   No orders of the defined types were placed in this encounter.   Signed,  Jacques DASEN. Demetry Bendickson, MD   Allergies as of 04/24/2024       Reactions   Penicillins Anaphylaxis         Medication List        Accurate as of April 24, 2024  9:49 AM. If you have any questions, ask your nurse or doctor.          STOP taking these medications    estradiol  1 MG tablet Commonly known as: ESTRACE  Stopped by: Jacques Burech Mcfarland   medroxyPROGESTERone  2.5 MG tablet Commonly known as: PROVERA  Stopped by: Jacques Clancy Leiner   Premarin  vaginal cream Generic drug: conjugated estrogens  Stopped by: Jacques Charod Slawinski       TAKE these medications    albuterol  108 (90 Base) MCG/ACT inhaler Commonly known as: VENTOLIN  HFA INHALE 2 PUFFS INTO THE LUNGS EVERY 4 HOURS AS NEEDED FOR WHEEZE   aspirin 81 MG chewable tablet Chew 81 mg by mouth daily.   benzonatate  200 MG capsule Commonly known as: TESSALON  Take 1 capsule (200 mg total) by mouth 3 (three) times daily as needed.   Biotin 5000 MCG Tabs Take 2 tablets by mouth daily.   calcium carbonate 1250 MG capsule Take 1,250 mg by mouth 2 (two) times daily with a meal.   cetirizine 10 MG tablet Commonly known as: ZYRTEC Take 10 mg by mouth daily.   docusate sodium 100 MG capsule Commonly known as: COLACE Take 200 mg by mouth 2 (two) times daily.   doxycycline  100 MG tablet Commonly known as: VIBRA -TABS Take 1  tablet (100 mg total) by mouth 2 (two) times daily for 10 days.   gabapentin  100 MG capsule Commonly known as: Neurontin  Take 1 capsule (100 mg total) by mouth 2 (two) times daily.   gabapentin  600 MG tablet Commonly known as: NEURONTIN  TAKE 1 TABLET BY MOUTH AT BEDTIME.   ipratropium 0.03 % nasal spray Commonly known as: ATROVENT  PLACE 2 SPRAYS INTO BOTH NOSTRILS EVERY 12 HOURS.   Linzess  290 MCG Caps capsule Generic drug: linaclotide  TAKE 1 CAPSULE BY MOUTH DAILY BEFORE BREAKFAST.   metoprolol  succinate 25 MG 24 hr tablet Commonly known as: TOPROL -XL TAKE 1 TABLET (25 MG TOTAL) BY MOUTH DAILY.   multivitamin tablet Take 1 tablet by mouth daily.   pantoprazole  40 MG tablet Commonly known as:  PROTONIX  Take 1 tablet (40 mg total) by mouth 2 (two) times daily.   Retin-A 0.025 % cream Generic drug: tretinoin Apply topically at bedtime.   Spacer/Aero-Holding [employer] Use with Albuterol  inhaler        "

## 2024-04-24 ENCOUNTER — Ambulatory Visit (INDEPENDENT_AMBULATORY_CARE_PROVIDER_SITE_OTHER): Admitting: Family Medicine

## 2024-04-24 ENCOUNTER — Encounter: Payer: Self-pay | Admitting: Family Medicine

## 2024-04-24 VITALS — BP 106/63 | HR 61 | Temp 98.0°F | Ht 63.75 in | Wt 159.5 lb

## 2024-04-24 DIAGNOSIS — Z0000 Encounter for general adult medical examination without abnormal findings: Secondary | ICD-10-CM | POA: Diagnosis not present

## 2024-05-07 ENCOUNTER — Encounter: Payer: Self-pay | Admitting: Family Medicine

## 2024-05-08 ENCOUNTER — Encounter: Payer: Self-pay | Admitting: Family Medicine

## 2024-05-08 ENCOUNTER — Ambulatory Visit (INDEPENDENT_AMBULATORY_CARE_PROVIDER_SITE_OTHER): Admitting: Family Medicine

## 2024-05-08 VITALS — BP 102/74 | HR 71 | Temp 97.9°F | Ht 63.75 in | Wt 156.1 lb

## 2024-05-08 DIAGNOSIS — J0101 Acute recurrent maxillary sinusitis: Secondary | ICD-10-CM | POA: Diagnosis not present

## 2024-05-08 MED ORDER — PREDNISONE 20 MG PO TABS: ORAL_TABLET | ORAL | 0 refills | Status: AC

## 2024-05-08 MED ORDER — LEVOFLOXACIN 500 MG PO TABS: 500.0000 mg | ORAL_TABLET | Freq: Every day | ORAL | 0 refills | Status: AC

## 2024-05-08 NOTE — Progress Notes (Signed)
 [name] Izzy Courville, MD, CAQ Sports Medicine [facility] at [facility] [address] [place], [address]  Phone: [phone]  FAX: [phone]  Katie Todd - 63 y.o. female  MRN [id number]  Date of Birth: 02/10/1961  Date: 05/08/2024  PCP: Watt Mirza, MD  Referral: Watt Mirza, MD  Chief Complaint  Patient presents with   Headache   Facial Pain    Behind Left Eye    Belepharitis   Post Nasal Drip   Subjective:   Katie Todd is a 63 y.o. very pleasant female patient with Body mass index is 27.01 kg/m. who presents with the following:  Discussed the use of AI scribe software for clinical note transcription with the patient, who gave verbal consent to proceed. History of Present Illness Katie Todd is a 63 year old female with recurrent sinusitis who presents with sinusitis symptoms.  She reports symptoms similar to previous episodes, including redness and swelling under her eye, with pain along the top eyelid. The swelling is described as the 'exact same thing' as before, with a previous instance where the swelling was open. Her left ear is also bothering her.  She has a history of nasal surgery and was previously cultured for Morganella, for which she was treated with Bactrim  DS for 21 days despite having no symptoms at that time. Currently, there is no drainage from the side where she had surgery, despite daily irrigation. She uses an Atrovent  inhaler and has been on doxycycline , which initially cleared her symptoms, but they resurfaced about a week after her last visit.  She has a penicillin allergy , which her mother described as causing anaphylaxis. She has not exercised in a week due to feeling unwell, which she attributes to her current illness. She experiences 'brain fog' and general malaise, which have worsened over the past four to five days.  She reports feeling 'terrible' and 'sick' for about two weeks, with  significant fatigue and a recent Charley horse due to lack of exercise.    Review of Systems is noted in the HPI, as appropriate  Objective:   BP 102/74   Pulse 71   Temp 97.9 F (36.6 C) (Temporal)   Ht 5' 3.75 (1.619 m)   Wt 156 lb 2 oz (70.8 kg)   LMP 01/24/2014 Comment: october 2014 was the one prior to this July  SpO2 99%   BMI 27.01 kg/m    Gen: WDWN, NAD; alert,appropriate and cooperative throughout exam  HEENT: Normocephalic and atraumatic. Throat clear, w/o exudate, no LAD, R TM clear, L TM - good landmarks, No fluid present. rhinnorhea.  Left frontal and maxillary sinuses: Tender Max Right frontal and maxillary sinuses: Non-Tender  Neck: No ant or post LAD CV: RRR, No M/G/R Pulm: Breathing comfortably in no resp distress. no w/c/r Psych: full affect, pleasant   Laboratory and Imaging Data:  Assessment and Plan:     ICD-10-CM   1. Acute recurrent maxillary sinusitis  J01.01      Assessment & Plan Chronic sinusitis Recurrent sinusitis with periorbital swelling, resistant to doxycycline . Worsening symptoms over past days. No dental or ear infection. Previous nasal surgery and Morganella infection noted. - Prescribed Levaquin  for 10 days. - Prescribed prednisone  to reduce swelling. - Consider further evaluation, including nasal laryngoscopy, if symptoms persist or recur in three weeks.  Medication Management during today's office visit: Meds ordered this encounter  Medications   levofloxacin  (LEVAQUIN ) 500 MG tablet    Sig: Take  1 tablet (500 mg total) by mouth daily for 10 days.    Dispense:  10 tablet    Refill:  0   predniSONE  (DELTASONE ) 20 MG tablet    Sig: 2 tabs po for 4 days, then 1 tab po for 4 days    Dispense:  12 tablet    Refill:  0   Medications Discontinued During This Encounter  Medication Reason   benzonatate  (TESSALON ) 200 MG capsule Completed Course    Orders placed today for conditions managed today: No orders of the defined  types were placed in this encounter.   Disposition: No follow-ups on file.  [employer] speech-to-text software was used for transcription in this dictation.  Possible transcriptional errors can occur using Animal nutritionist.   Signed,  Jacques DASEN. Danniell Rotundo, MD   Outpatient Encounter Medications as of 05/08/2024  Medication Sig   albuterol  (VENTOLIN  HFA) 108 (90 Base) MCG/ACT inhaler INHALE 2 PUFFS INTO THE LUNGS EVERY 4 HOURS AS NEEDED FOR WHEEZE   aspirin 81 MG chewable tablet Chew 81 mg by mouth daily.   Biotin 5000 MCG TABS Take 2 tablets by mouth daily.   calcium carbonate 1250 MG capsule Take 1,250 mg by mouth 2 (two) times daily with a meal.   cetirizine (ZYRTEC) 10 MG tablet Take 10 mg by mouth daily.   docusate sodium (COLACE) 100 MG capsule Take 200 mg by mouth 2 (two) times daily.   gabapentin  (NEURONTIN ) 100 MG capsule Take 1 capsule (100 mg total) by mouth 2 (two) times daily.   gabapentin  (NEURONTIN ) 600 MG tablet TAKE 1 TABLET BY MOUTH AT BEDTIME.   ipratropium (ATROVENT ) 0.03 % nasal spray PLACE 2 SPRAYS INTO BOTH NOSTRILS EVERY 12 HOURS.   levofloxacin  (LEVAQUIN ) 500 MG tablet Take 1 tablet (500 mg total) by mouth daily for 10 days.   linaclotide  (LINZESS ) 290 MCG CAPS capsule TAKE 1 CAPSULE BY MOUTH DAILY BEFORE BREAKFAST.   metoprolol  succinate (TOPROL -XL) 25 MG 24 hr tablet TAKE 1 TABLET (25 MG TOTAL) BY MOUTH DAILY.   Multiple Vitamin (MULTIVITAMIN) tablet Take 1 tablet by mouth daily.   pantoprazole  (PROTONIX ) 40 MG tablet Take 1 tablet (40 mg total) by mouth 2 (two) times daily.   predniSONE  (DELTASONE ) 20 MG tablet 2 tabs po for 4 days, then 1 tab po for 4 days   RETIN-A 0.025 % cream Apply topically at bedtime.   Spacer/Aero-Holding [employer] Use with Albuterol  inhaler   [DISCONTINUED] benzonatate  (TESSALON ) 200 MG capsule Take 1 capsule (200 mg total) by mouth 3 (three) times daily as needed.   No facility-administered encounter medications on file as  of 05/08/2024.

## 2024-05-15 ENCOUNTER — Other Ambulatory Visit: Payer: Self-pay | Admitting: Family Medicine

## 2024-05-25 ENCOUNTER — Other Ambulatory Visit: Payer: Self-pay | Admitting: Family Medicine

## 2024-05-27 NOTE — Telephone Encounter (Signed)
 Gabapentin  Last filled:  02/25/24, #[id number] Last OV:  05/08/24, acute sinusitis; prior OV:  04/24/24, CPE Next OV:  none

## 2024-07-09 DIAGNOSIS — H0288B Meibomian gland dysfunction left eye, upper and lower eyelids: Secondary | ICD-10-CM | POA: Diagnosis not present

## 2024-07-09 DIAGNOSIS — H0288A Meibomian gland dysfunction right eye, upper and lower eyelids: Secondary | ICD-10-CM | POA: Diagnosis not present

## 2024-07-09 DIAGNOSIS — H10413 Chronic giant papillary conjunctivitis, bilateral: Secondary | ICD-10-CM | POA: Diagnosis not present

## 2024-07-14 ENCOUNTER — Other Ambulatory Visit: Payer: Self-pay | Admitting: Family Medicine

## 2024-07-19 ENCOUNTER — Encounter: Payer: Self-pay | Admitting: Cardiology
